# Patient Record
Sex: Male | Born: 1945 | Race: Black or African American | Hispanic: No | Marital: Single | Smoking: Former smoker
Health system: Southern US, Community
[De-identification: ages and names within clinical notes are randomized; demographics above are authoritative.]

## PROBLEM LIST (undated history)

## (undated) DIAGNOSIS — I219 Acute myocardial infarction, unspecified: Secondary | ICD-10-CM

## (undated) DIAGNOSIS — N189 Chronic kidney disease, unspecified: Secondary | ICD-10-CM

## (undated) DIAGNOSIS — Z87438 Personal history of other diseases of male genital organs: Secondary | ICD-10-CM

## (undated) DIAGNOSIS — K219 Gastro-esophageal reflux disease without esophagitis: Secondary | ICD-10-CM

## (undated) DIAGNOSIS — E785 Hyperlipidemia, unspecified: Secondary | ICD-10-CM

## (undated) DIAGNOSIS — C801 Malignant (primary) neoplasm, unspecified: Secondary | ICD-10-CM

## (undated) DIAGNOSIS — I10 Essential (primary) hypertension: Secondary | ICD-10-CM

## (undated) DIAGNOSIS — R1310 Dysphagia, unspecified: Secondary | ICD-10-CM

## (undated) DIAGNOSIS — I739 Peripheral vascular disease, unspecified: Secondary | ICD-10-CM

## (undated) HISTORY — PX: APPENDECTOMY: SHX54

## (undated) HISTORY — PX: STOMACH SURGERY: SHX791

---

## 2006-08-27 ENCOUNTER — Emergency Department: Payer: Self-pay | Admitting: Emergency Medicine

## 2006-08-27 ENCOUNTER — Other Ambulatory Visit: Payer: Self-pay

## 2007-04-17 ENCOUNTER — Other Ambulatory Visit: Payer: Self-pay

## 2007-04-17 ENCOUNTER — Emergency Department: Payer: Self-pay | Admitting: Emergency Medicine

## 2007-04-28 ENCOUNTER — Other Ambulatory Visit: Payer: Self-pay

## 2007-04-28 ENCOUNTER — Emergency Department: Payer: Self-pay | Admitting: Emergency Medicine

## 2007-05-16 ENCOUNTER — Ambulatory Visit: Payer: Self-pay | Admitting: Family Medicine

## 2007-06-15 ENCOUNTER — Emergency Department: Payer: Self-pay | Admitting: Emergency Medicine

## 2008-01-13 ENCOUNTER — Emergency Department: Payer: Self-pay | Admitting: Internal Medicine

## 2008-06-25 ENCOUNTER — Emergency Department: Payer: Self-pay | Admitting: Emergency Medicine

## 2008-09-27 ENCOUNTER — Ambulatory Visit: Payer: Self-pay | Admitting: Gastroenterology

## 2008-10-11 ENCOUNTER — Emergency Department: Payer: Self-pay | Admitting: Emergency Medicine

## 2008-10-12 ENCOUNTER — Emergency Department: Payer: Self-pay | Admitting: Emergency Medicine

## 2008-10-15 ENCOUNTER — Ambulatory Visit: Payer: Self-pay | Admitting: Internal Medicine

## 2010-12-13 ENCOUNTER — Observation Stay: Payer: Self-pay | Admitting: Specialist

## 2010-12-18 LAB — PATHOLOGY REPORT

## 2011-03-30 ENCOUNTER — Emergency Department: Payer: Self-pay | Admitting: Emergency Medicine

## 2012-01-09 ENCOUNTER — Emergency Department: Payer: Self-pay | Admitting: Emergency Medicine

## 2012-01-09 LAB — URINALYSIS, COMPLETE
Ketone: NEGATIVE
Leukocyte Esterase: NEGATIVE
Nitrite: NEGATIVE
Ph: 6 (ref 4.5–8.0)
Squamous Epithelial: 1

## 2012-01-09 LAB — BASIC METABOLIC PANEL
Anion Gap: 10 (ref 7–16)
BUN: 18 mg/dL (ref 7–18)
Chloride: 101 mmol/L (ref 98–107)
Creatinine: 1.13 mg/dL (ref 0.60–1.30)
EGFR (Non-African Amer.): >60
Glucose: 100 mg/dL — ABNORMAL HIGH (ref 65–99)
Osmolality: 280 (ref 275–301)
Sodium: 139 mmol/L (ref 136–145)

## 2012-01-09 LAB — CBC
HGB: 14.7 g/dL (ref 13.0–18.0)
MCH: 28.8 pg (ref 26.0–34.0)
MCHC: 33.7 g/dL (ref 32.0–36.0)
MCV: 86 fL (ref 80–100)
Platelet: 268 10*3/uL (ref 150–440)
RBC: 5.09 10*6/uL (ref 4.40–5.90)

## 2012-01-13 ENCOUNTER — Emergency Department: Payer: Self-pay | Admitting: Emergency Medicine

## 2012-01-13 LAB — URINALYSIS, COMPLETE
Epithelial Cast: 1
Glucose,UR: NEGATIVE mg/dL (ref 0–75)
Hyaline Cast: 13
Ketone: NEGATIVE
Leukocyte Esterase: NEGATIVE
Nitrite: NEGATIVE
Ph: 5 (ref 4.5–8.0)
Protein: 30
RBC,UR: 12 /HPF (ref 0–5)
Specific Gravity: 1.019 (ref 1.003–1.030)
Squamous Epithelial: <1
WBC UR: 8 /HPF (ref 0–5)
White Blood Cell Cast: 4

## 2013-05-31 ENCOUNTER — Emergency Department: Payer: Self-pay | Admitting: Emergency Medicine

## 2013-11-14 ENCOUNTER — Inpatient Hospital Stay: Payer: Self-pay | Admitting: Orthopedic Surgery

## 2013-11-14 ENCOUNTER — Ambulatory Visit: Payer: Self-pay | Admitting: Orthopedic Surgery

## 2013-11-14 LAB — BASIC METABOLIC PANEL
Anion Gap: 9 (ref 7–16)
BUN: 25 mg/dL — ABNORMAL HIGH (ref 7–18)
CREATININE: 1.24 mg/dL (ref 0.60–1.30)
Calcium, Total: 9 mg/dL (ref 8.5–10.1)
Chloride: 103 mmol/L (ref 98–107)
Co2: 28 mmol/L (ref 21–32)
EGFR (African American): >60
EGFR (Non-African Amer.): 59 — ABNORMAL LOW
Glucose: 110 mg/dL — ABNORMAL HIGH (ref 65–99)
OSMOLALITY: 284 (ref 275–301)
Potassium: 3.6 mmol/L (ref 3.5–5.1)
SODIUM: 140 mmol/L (ref 136–145)

## 2013-11-14 LAB — CBC
HCT: 43.8 % (ref 40.0–52.0)
HGB: 14 g/dL (ref 13.0–18.0)
MCH: 27.3 pg (ref 26.0–34.0)
MCHC: 31.9 g/dL — ABNORMAL LOW (ref 32.0–36.0)
MCV: 86 fL (ref 80–100)
Platelet: 252 10*3/uL (ref 150–440)
RBC: 5.11 10*6/uL (ref 4.40–5.90)
RDW: 15.4 % — ABNORMAL HIGH (ref 11.5–14.5)
WBC: 8.3 10*3/uL (ref 3.8–10.6)
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
## 2014-07-20 NOTE — Op Note (Signed)
PATIENT NAME:  Ruben Diaz, Ruben Diaz MR#:  [id number] DATE OF BIRTH:  [birth year]  DATE OF PROCEDURE:  11/15/2013  PREOPERATIVE DIAGNOSES: Right tibia and fibula fractures.   POSTOPERATIVE DIAGNOSES: Right tibia and fibula fractures.   PROCEDURE: Right tibial nailing.   ANESTHESIA: Spinal.   SURGEON: Dr. Rudene Christians.   DESCRIPTION OF PROCEDURE: The patient was brought to the operating room and after adequate anesthesia was obtained, the right leg was prepped and draped in the usual sterile fashion with a tourniquet applied to the upper thigh but not required during the procedure. After patient identification and timeout procedures were completed, the knee was in a flexed position and the C-arm was brought in. Visualization of the starting point for the tibia was determined, and a midline patellar tendon splitting incision was made. The starter hole was then again repeat identified and an awl used to get a start hole in the tibia. A guidewire was inserted down the canal and the fracture was held in a reduced position, as the guidewire was passed across the fracture site checking both AP and lateral images to make certain we were in the distal fragment. The wire was measured, and a length of 360 mm determined. Reaming was carried out to 10.5 mm, at which point there was good chatter in the shaft and a 360 x 9-mm Biomet VersaNail was impacted down the canal. The guidewire was removed, and when the nail was at the appropriate level, the proximal interlocking guide was placed and a dynamic screw placed proximally, drilling, measuring, and placing the bicortical screw. Next, going distally, a freehand technique was used to fix the distal fragment with 2 medial to lateral interlocking screws with standard technique, visualizing the holes, making a small incision, drilling, measuring, and placing bicortical screws. After this was completed, the rotation appeared to be appropriate. AP and lateral alignment was near anatomic.  The wounds were irrigated and closed with 0 Vicryl for the patellar tendon, 2-0 Vicryl subcutaneously, and skin staples. Xeroform, 4 x 4's, Webril, and Ace wrap applied and the patient was sent to the recovery room in stable condition.   ESTIMATED BLOOD LOSS: 50 mL.   COMPLICATIONS: None.   SPECIMEN: None.   IMPLANTS: Biomet/DePuy tibial VersaNail 9 x 360, with a zero end cap.   CONDITION: To recovery room, stable.   COMPLICATIONS: None.    ____________________________ Laurene Footman, MD mjm:lt D: 11/15/2013 17:49:15 ET T: 11/15/2013 18:41:11 ET JOB#: [id number]  cc: Laurene Footman, MD, <Dictator> Laurene Footman MD ELECTRONICALLY SIGNED 11/16/2013 7:15
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
## 2016-03-30 ENCOUNTER — Telehealth: Payer: Self-pay | Admitting: *Deleted

## 2016-03-30 DIAGNOSIS — Z87891 Personal history of nicotine dependence: Secondary | ICD-10-CM

## 2016-03-30 NOTE — Telephone Encounter (Signed)
Received referral for initial lung cancer screening scan. Contacted patient/spouse and obtained smoking history,(former, quit 5 years ago, 35.7 pack year ) as well as answering questions related to screening process. Patient denies signs of lung cancer such as weight loss or hemoptysis. Patient denies comorbidity that would prevent curative treatment if lung cancer were found. Patient is tentatively scheduled for shared decision making visit and CT scan on 04/06/16 at 1:30pm, pending insurance approval from business office.

## 2016-04-06 ENCOUNTER — Ambulatory Visit
Admission: RE | Admit: 2016-04-06 | Discharge: 2016-04-06 | Disposition: A | Discharge status: Home / Self Care | Payer: Medicare Other | Source: Ambulatory Visit | Attending: Oncology | Admitting: Oncology

## 2016-04-06 ENCOUNTER — Inpatient Hospital Stay: Payer: Medicare Other | Attending: Oncology | Admitting: Oncology

## 2016-04-06 ENCOUNTER — Encounter: Payer: Self-pay | Admitting: Oncology

## 2016-04-06 DIAGNOSIS — Z87891 Personal history of nicotine dependence: Secondary | ICD-10-CM

## 2016-04-06 DIAGNOSIS — I700 Atherosclerosis of aorta: Secondary | ICD-10-CM | POA: Diagnosis not present

## 2016-04-06 DIAGNOSIS — J439 Emphysema, unspecified: Secondary | ICD-10-CM | POA: Diagnosis not present

## 2016-04-06 DIAGNOSIS — Z122 Encounter for screening for malignant neoplasm of respiratory organs: Secondary | ICD-10-CM

## 2016-04-06 DIAGNOSIS — J9811 Atelectasis: Secondary | ICD-10-CM | POA: Diagnosis not present

## 2016-04-07 ENCOUNTER — Telehealth: Payer: Self-pay | Admitting: *Deleted

## 2016-04-07 DIAGNOSIS — Z87891 Personal history of nicotine dependence: Secondary | ICD-10-CM | POA: Insufficient documentation

## 2016-04-07 NOTE — Telephone Encounter (Signed)
Notified patient of LDCT lung cancer screening results with recommendation for 12 month follow up imaging. Also notified of incidental finding noted below and encouraged to discuss with PCP. Patient verbalizes understanding. This note will be sent to PCP via Epic.  IMPRESSION: 1. Lung-RADS Category 2, benign appearance or behavior. Continue annual screening with low-dose chest CT without contrast in 12 months. 2. Aortic atherosclerosis (ICD10-170.0). Coronary artery calcification. 3.  Emphysema (ICD10-J43.9).

## 2016-04-07 NOTE — Progress Notes (Signed)
In accordance with CMS guidelines, patient has met eligibility criteria including age, absence of signs or symptoms of lung cancer.  Social History  Substance Use Topics  . Smoking status: Former Smoker    Packs/day: 0.70    Years: 51.00    Quit date: 2013  . Smokeless tobacco: Not on file  . Alcohol use Not on file     A shared decision-making session was conducted prior to the performance of CT scan. This includes one or more decision aids, includes benefits and harms of screening, follow-up diagnostic testing, over-diagnosis, false positive rate, and total radiation exposure.  Counseling on the importance of adherence to annual lung cancer LDCT screening, impact of co-morbidities, and ability or willingness to undergo diagnosis and treatment is imperative for compliance of the program.  Counseling on the importance of continued smoking cessation for former smokers; the importance of smoking cessation for current smokers, and information about tobacco cessation interventions have been given to patient including [place] and [place] quit [place] programs.  Written order for lung cancer screening with LDCT has been given to the patient and any and all questions have been answered to the best of my abilities.   Yearly follow up will be coordinated by Burgess Estelle, Thoracic Navigator.

## 2016-05-28 ENCOUNTER — Other Ambulatory Visit: Payer: Self-pay | Admitting: Gastroenterology

## 2016-05-28 DIAGNOSIS — R1310 Dysphagia, unspecified: Secondary | ICD-10-CM

## 2016-06-04 ENCOUNTER — Ambulatory Visit
Admission: RE | Admit: 2016-06-04 | Discharge: 2016-06-04 | Disposition: A | Discharge status: Home / Self Care | Payer: Medicare Other | Source: Ambulatory Visit | Attending: Gastroenterology | Admitting: Gastroenterology

## 2016-06-04 DIAGNOSIS — K449 Diaphragmatic hernia without obstruction or gangrene: Secondary | ICD-10-CM | POA: Diagnosis not present

## 2016-06-04 DIAGNOSIS — R1310 Dysphagia, unspecified: Secondary | ICD-10-CM | POA: Insufficient documentation

## 2016-09-15 ENCOUNTER — Encounter: Payer: Self-pay | Admitting: *Deleted

## 2016-09-16 ENCOUNTER — Encounter: Payer: Self-pay | Admitting: *Deleted

## 2016-09-16 ENCOUNTER — Ambulatory Visit
Admission: RE | Admit: 2016-09-16 | Discharge: 2016-09-16 | Disposition: A | Discharge status: Home / Self Care | Payer: Medicare Other | Source: Ambulatory Visit | Attending: Gastroenterology | Admitting: Gastroenterology

## 2016-09-16 ENCOUNTER — Ambulatory Visit: Payer: Medicare Other | Admitting: Anesthesiology

## 2016-09-16 ENCOUNTER — Encounter
Admission: RE | Disposition: A | Discharge status: Home / Self Care | Payer: Self-pay | Source: Ambulatory Visit | Attending: Gastroenterology

## 2016-09-16 DIAGNOSIS — I739 Peripheral vascular disease, unspecified: Secondary | ICD-10-CM | POA: Insufficient documentation

## 2016-09-16 DIAGNOSIS — I10 Essential (primary) hypertension: Secondary | ICD-10-CM | POA: Insufficient documentation

## 2016-09-16 DIAGNOSIS — K319 Disease of stomach and duodenum, unspecified: Secondary | ICD-10-CM | POA: Diagnosis not present

## 2016-09-16 DIAGNOSIS — Z980 Intestinal bypass and anastomosis status: Secondary | ICD-10-CM | POA: Insufficient documentation

## 2016-09-16 DIAGNOSIS — K2210 Ulcer of esophagus without bleeding: Secondary | ICD-10-CM | POA: Insufficient documentation

## 2016-09-16 DIAGNOSIS — K2960 Other gastritis without bleeding: Secondary | ICD-10-CM | POA: Diagnosis not present

## 2016-09-16 DIAGNOSIS — D123 Benign neoplasm of transverse colon: Secondary | ICD-10-CM | POA: Insufficient documentation

## 2016-09-16 DIAGNOSIS — K224 Dyskinesia of esophagus: Secondary | ICD-10-CM | POA: Diagnosis not present

## 2016-09-16 DIAGNOSIS — Z86010 Personal history of colonic polyps: Secondary | ICD-10-CM | POA: Insufficient documentation

## 2016-09-16 DIAGNOSIS — D124 Benign neoplasm of descending colon: Secondary | ICD-10-CM | POA: Diagnosis not present

## 2016-09-16 DIAGNOSIS — Z87891 Personal history of nicotine dependence: Secondary | ICD-10-CM | POA: Diagnosis not present

## 2016-09-16 DIAGNOSIS — R1310 Dysphagia, unspecified: Secondary | ICD-10-CM | POA: Insufficient documentation

## 2016-09-16 HISTORY — DX: Peripheral vascular disease, unspecified: I73.9

## 2016-09-16 HISTORY — PX: ESOPHAGOGASTRODUODENOSCOPY (EGD) WITH PROPOFOL: SHX5813

## 2016-09-16 HISTORY — DX: Chronic kidney disease, unspecified: N18.9

## 2016-09-16 HISTORY — DX: Essential (primary) hypertension: I10

## 2016-09-16 HISTORY — DX: Personal history of other diseases of male genital organs: Z87.438

## 2016-09-16 HISTORY — PX: COLONOSCOPY WITH PROPOFOL: SHX5780

## 2016-09-16 SURGERY — COLONOSCOPY WITH PROPOFOL
Anesthesia: General

## 2016-09-16 MED ORDER — FENTANYL CITRATE (PF) 100 MCG/2ML IJ SOLN
INTRAMUSCULAR | Status: DC | PRN
Start: 2016-09-16 — End: 2016-09-16
  Administered 2016-09-16 (×2): 50 ug via INTRAVENOUS

## 2016-09-16 MED ORDER — SODIUM CHLORIDE 0.9 % IV SOLN: INTRAVENOUS | Status: DC

## 2016-09-16 MED ORDER — PROPOFOL 500 MG/50ML IV EMUL
INTRAVENOUS | Status: AC
  Filled 2016-09-16: qty 50

## 2016-09-16 MED ORDER — SODIUM CHLORIDE 0.9 % IV SOLN
INTRAVENOUS | Status: DC
  Administered 2016-09-16 (×3): via INTRAVENOUS

## 2016-09-16 MED ORDER — PROPOFOL 500 MG/50ML IV EMUL
INTRAVENOUS | Status: DC | PRN
  Administered 2016-09-16: 160 ug/kg/min via INTRAVENOUS

## 2016-09-16 MED ORDER — FENTANYL CITRATE (PF) 100 MCG/2ML IJ SOLN
INTRAMUSCULAR | Status: AC
  Filled 2016-09-16: qty 2

## 2016-09-16 MED ORDER — MIDAZOLAM HCL 5 MG/5ML IJ SOLN
INTRAMUSCULAR | Status: DC | PRN
  Administered 2016-09-16 (×2): 1 mg via INTRAVENOUS

## 2016-09-16 MED ORDER — MIDAZOLAM HCL 2 MG/2ML IJ SOLN
INTRAMUSCULAR | Status: AC
  Filled 2016-09-16: qty 2

## 2016-09-16 MED ORDER — LIDOCAINE 2% (20 MG/ML) 5 ML SYRINGE
INTRAMUSCULAR | Status: DC | PRN
  Administered 2016-09-16: 40 mg via INTRAVENOUS

## 2016-09-16 MED ORDER — PROPOFOL 10 MG/ML IV BOLUS
INTRAVENOUS | Status: DC | PRN
  Administered 2016-09-16: 100 mg via INTRAVENOUS

## 2016-09-16 MED ORDER — PHENYLEPHRINE HCL 10 MG/ML IJ SOLN
INTRAMUSCULAR | Status: DC | PRN
  Administered 2016-09-16: 100 ug via INTRAVENOUS

## 2016-09-16 NOTE — Anesthesia Preprocedure Evaluation (Addendum)
Anesthesia Evaluation  Patient identified by MRN, date of birth, ID band Patient awake    Reviewed: Allergy & Precautions, NPO status , Patient's Chart, lab work & pertinent test results  Airway Mallampati: II  TM Distance: >3 FB     Dental  (+) Upper Dentures, Lower Dentures   Pulmonary former smoker,    Pulmonary exam normal        Cardiovascular hypertension, Pt. on medications + Peripheral Vascular Disease  Normal cardiovascular exam     Neuro/Psych negative neurological ROS  negative psych ROS   GI/Hepatic negative GI ROS, Neg liver ROS,   Endo/Other  negative endocrine ROS  Renal/GU Renal InsufficiencyRenal disease  negative genitourinary   Musculoskeletal negative musculoskeletal ROS (+)   Abdominal Normal abdominal exam  (+)   Peds negative pediatric ROS (+)  Hematology negative hematology ROS (+)   Anesthesia Other Findings   Reproductive/Obstetrics                            Anesthesia Physical Anesthesia Plan  ASA: III  Anesthesia Plan: General   Post-op Pain Management:    Induction: Intravenous  PONV Risk Score and Plan:   Airway Management Planned: Nasal Cannula  Additional Equipment:   Intra-op Plan:   Post-operative Plan:   Informed Consent: I have reviewed the patients History and Physical, chart, labs and discussed the procedure including the risks, benefits and alternatives for the proposed anesthesia with the patient or authorized representative who has indicated his/her understanding and acceptance.   Dental advisory given  Plan Discussed with: CRNA and Surgeon  Anesthesia Plan Comments:         Anesthesia Quick Evaluation

## 2016-09-16 NOTE — Transfer of Care (Signed)
Immediate Anesthesia Transfer of Care Note  Patient: Ruben Diaz  Procedure(s) Performed: Procedure(s): COLONOSCOPY WITH PROPOFOL (N/A) ESOPHAGOGASTRODUODENOSCOPY (EGD) WITH PROPOFOL (N/A)  Patient Location: PACU and Endoscopy Unit  Anesthesia Type:General  Level of Consciousness: sedated  Airway & Oxygen Therapy: Patient Spontanous Breathing and Patient connected to nasal cannula oxygen  Post-op Assessment: Report given to RN and Post -op Vital signs reviewed and stable  Post vital signs: Reviewed and stable  Last Vitals:  Vitals:   09/16/16 0853  BP: 131/62  Pulse: 62  Resp: 18  Temp: (!) 36 C    Last Pain:  Vitals:   09/16/16 0853  TempSrc: Tympanic         Complications: No apparent anesthesia complications

## 2016-09-16 NOTE — Op Note (Signed)
[facility] Gastroenterology Patient Name: Ruben Diaz Procedure Date: 09/16/2016 10:33 AM MRN: [id number] Account #: [id number] Date of Birth: [birth year] Admit Type: Outpatient Age: 70 Room: [facility] ENDO ROOM 1 Gender: Male Note Status: Finalized Procedure:            Colonoscopy Indications:          Personal history of colonic polyps Providers:            [name], MD Referring MD:         Adrian Prows (Referring MD) Medicines:            Monitored Anesthesia Care Complications:        No immediate complications. Procedure:            Pre-Anesthesia Assessment:                       - ASA Grade Assessment: III - A patient with severe                        systemic disease.                       After obtaining informed consent, the colonoscope was                        passed under direct vision. Throughout the procedure,                        the patient's blood pressure, pulse, and oxygen                        saturations were monitored continuously. The                        Colonoscope was introduced through the anus and                        advanced to the the cecum, identified by appendiceal                        orifice and ileocecal valve. The colonoscopy was                        extremely difficult due to restricted mobility of the                        colon and a tortuous colon. Successful completion of                        the procedure was aided by changing the patient to a                        supine position, changing the patient to a prone                        position and using manual pressure. The quality of the                        bowel preparation was fair. The patient tolerated the  procedure well. Findings:      A 3 mm polyp was found in the descending colon. The polyp was sessile.       The polyp was removed with a cold biopsy forceps. Resection and       retrieval were  complete.      A 5 mm polyp was found in the splenic flexure. The polyp was sessile.       The polyp was removed with a cold snare. Resection and retrieval were       complete.      A 2 mm polyp was found in the splenic flexure. The polyp was sessile.       The polyp was removed with a cold biopsy forceps. Resection and       retrieval were complete.      A 4 mm polyp was found in the transverse colon. The polyp was sessile.       The polyp was removed with a cold snare. Resection and retrieval were       complete.      A 4 mm polyp was found in the hepatic flexure. The polyp was sessile.       The polyp was removed with a cold biopsy forceps. Polyp resection was       incomplete. The resected tissue was retrieved. Scope malfunction at this       point caused removal of instrument. Another scope was reintroduced but       despite multiple passes this could not be relocated.      A 4 mm polyp was found in the distal transverse colon. The polyp was       sessile. The polyp was removed with a cold snare. Resection and       retrieval were complete.      A 5 mm polyp was found in the hepatic flexure. The polyp was flat. The       polyp was removed with a cold snare. Resection and retrieval were       complete.      It is of noted that the right colon is very difficult to access and       evaluate due to apparent tethering. Impression:           - Preparation of the colon was fair.                       - One 3 mm polyp in the descending colon, removed with                        a cold biopsy forceps. Resected and retrieved.                       - One 5 mm polyp at the splenic flexure, removed with a                        cold snare. Resected and retrieved.                       - One 2 mm polyp at the splenic flexure, removed with a                        cold biopsy forceps. Resected and retrieved.                       -  One 4 mm polyp in the transverse colon, removed with                         a cold snare. Resected and retrieved.                       - One 4 mm polyp at the hepatic flexure, removed with a                        cold biopsy forceps. Incomplete resection. Resected                        tissue retrieved.                       - One 4 mm polyp in the distal transverse colon,                        removed with a cold snare. Resected and retrieved.                       - One 5 mm polyp at the hepatic flexure, removed with a                        cold snare. Resected and retrieved. Recommendation:       - Full liquid diet today, then advance as tolerated to                        low residue diet for 3 days. Procedure Code(s):    --- Professional ---                       [phone], Colonoscopy, flexible; with removal of tumor(s),                        polyp(s), or other lesion(s) by snare technique                       [id number], 74, Colonoscopy, flexible; with biopsy, single                        or multiple Diagnosis Code(s):    --- Professional ---                       D12.4, Benign neoplasm of descending colon                       D12.3, Benign neoplasm of transverse colon (hepatic                        flexure or splenic flexure)                       Z86.010, Personal history of colonic polyps CPT copyright 2016 American Medical Association. All rights reserved. The codes documented in this report are preliminary and upon coder review may  be revised to meet current compliance requirements. [name], MD 09/16/2016 12:26:41 PM This report has been signed electronically. Number of Addenda: 0 Note Initiated On: 09/16/2016 10:33 AM Scope Withdrawal Time: 0 hours 41 minutes  16 seconds  Total Procedure Duration: 1 hour 8 minutes 12 seconds       [facility]

## 2016-09-16 NOTE — Anesthesia Postprocedure Evaluation (Signed)
Anesthesia Post Note  Patient: Davan Nawabi  Procedure(s) Performed: Procedure(s) (LRB): COLONOSCOPY WITH PROPOFOL (N/A) ESOPHAGOGASTRODUODENOSCOPY (EGD) WITH PROPOFOL (N/A)  Patient location during evaluation: Endoscopy Anesthesia Type: General Level of consciousness: awake and alert and oriented Pain management: pain level controlled Vital Signs Assessment: post-procedure vital signs reviewed and stable Respiratory status: spontaneous breathing, nonlabored ventilation and respiratory function stable Cardiovascular status: blood pressure returned to baseline and stable Postop Assessment: no signs of nausea or vomiting Anesthetic complications: no     Last Vitals:  Vitals:   09/16/16 1225 09/16/16 1234  BP: 109/69 127/77  Pulse: 68 60  Resp: 16 10  Temp: 36.2 C     Last Pain:  Vitals:   09/16/16 1225  TempSrc: Tympanic                 Lamoyne Hessel

## 2016-09-16 NOTE — Op Note (Signed)
[facility] Gastroenterology Patient Name: Ruben Diaz Procedure Date: 09/16/2016 10:34 AM MRN: [id number] Account #: [id number] Date of Birth: [birth year] Admit Type: Outpatient Age: 71 Room: [facility] ENDO ROOM 1 Gender: Male Note Status: Finalized Procedure:            Upper GI endoscopy Indications:          Dysphagia Providers:            [name], MD Referring MD:         Adrian Prows (Referring MD) Medicines:            Monitored Anesthesia Care Procedure:            Pre-Anesthesia Assessment:                       - ASA Grade Assessment: III - A patient with severe                        systemic disease.                       After obtaining informed consent, the endoscope was                        passed under direct vision. Throughout the procedure,                        the patient's blood pressure, pulse, and oxygen                        saturations were monitored continuously. The Endoscope                        was introduced through the mouth, and advanced to the                        gastric stoma. The upper GI endoscopy was accomplished                        without difficulty. The patient tolerated the procedure                        well. Findings:      Abnormal motility was noted in the mid esophagus and in the distal       esophagus. The cricopharyngeus was normal. There are extra peristaltic       waves in the esophageal body. Tertiary peristaltic waves are noted.      LA Grade D (one or more mucosal breaks involving at least 75% of       esophageal circumference) esophagitis with no bleeding was found.       Biopsies were taken with a cold forceps for histology.      Diffuse, moreso notedin the distal half of the stomach, severe       inflammation characterized by adherent blood, congestion (edema) and       erythema was found in the entire examined stomach. Biopsies were taken       with a cold forceps for  histology.      Evidence of a gastroenterostomy was found in the lesser curvature of the       stomach. This was characterized by mild edema  and minimal erosion just       inside the orifice. The angulation of the opening was very sharp, and I       was unable to pass the scope through to the small bowel.      The cardia and gastric fundus were normal on retroflexion otherwise. Impression:           - Abnormal esophageal motility, consistent with                        presbyesophagus.                       - LA Grade D erosive esophagitis. Biopsied.                       - Erosive gastritis. Biopsied.                       - A gastroenterostomy was found, characterized by edema                        and erosion. Recommendation:       - Use Protonix (pantoprazole) 40 mg PO BID for 6 weeks.                       - Use Protonix (pantoprazole) 40 mg PO daily daily.                       - Use sucralfate tablets 1 gram PO QID for 1 month.                       - Return to GI clinic in 5 weeks.                       - Repeat upper endoscopy in 8 weeks to evaluate the                        response to therapy. Procedure Code(s):    --- Professional ---                       [phone], 58, Esophagogastroduodenoscopy, flexible,                        transoral; with biopsy, single or multiple Diagnosis Code(s):    --- Professional ---                       K22.4, Dyskinesia of esophagus                       K20.8, Other esophagitis                       K29.60, Other gastritis without bleeding                       Z98.0, Intestinal bypass and anastomosis status                       R13.10, Dysphagia, unspecified CPT copyright 2016 American [employer] Association. All rights reserved. The codes documented in this report are preliminary and upon coder review may  be revised to meet current compliance requirements. [name], MD 09/16/2016 11:06:37 AM This report has been signed  electronically. Number of Addenda: 0 Note Initiated On: 09/16/2016 10:34 AM      [facility]

## 2016-09-16 NOTE — H&P (Signed)
Outpatient short stay form Pre-procedure 09/16/2016 10:31 AM [name] MD  Primary Physician: Dr. Adrian Prows  Reason for visit:  EGD and colonoscopy  History of present illness:  Patient is a 71 year old male presenting today as above. He has a personal history of colon polyps and has been overdue for recheck on his colonoscopy. He also has some issues with dysphagia. He had a barium swallow done on 06/04/2016. That time showed a small hiatal hernia negative for reflux a barium tablet passed without delay. It is of note that when I reviewed this he had evidence of presbyesophagus in the distal half of the esophagus with quite irregular contractions. It was no obvious stenosis or stricture. He does take 81 mg aspirin the last time this morning. He takes no other aspirin or blood thinning agents. He tolerated his prep well.    Current Facility-Administered Medications:  .  0.9 %  sodium chloride infusion, , Intravenous, Continuous, [name], MD, Last Rate: 20 mL/hr at 09/16/16 0906 .  0.9 %  sodium chloride infusion, , Intravenous, Continuous, [name], MD  Prescriptions Prior to Admission  Medication Sig Dispense Refill Last Dose  . amLODipine (NORVASC) 10 MG tablet Take 10 mg by mouth daily.   09/16/2016 at 0600  . aspirin EC 81 MG tablet Take 81 mg by mouth daily.   09/16/2016 at 0600  . atorvastatin (LIPITOR) 10 MG tablet Take 10 mg by mouth daily.   09/16/2016 at 0600  . hydrochlorothiazide (HYDRODIURIL) 25 MG tablet Take 25 mg by mouth daily.   09/16/2016 at 0600     No Known Allergies   Past Medical History:  Diagnosis Date  . Chronic kidney disease   . History of BPH   . Hypertension   . Peripheral vascular disease ([place])     Review of systems:      Physical Exam    Heart and lungs: Regular rate and rhythm without rub or gallop, lungs are bilaterally clear.    HEENT: Normocephalic atraumatic eyes are anicteric    Other:     Pertinant  exam for procedure: Soft nontender nondistended bowel sounds positive normoactive    Planned proceedures: EGD, colonoscopy and indicated procedures. I have discussed the risks benefits and complications of procedures to include not limited to bleeding, infection, perforation and the risk of sedation and the patient wishes to proceed.    [name], MD Gastroenterology 09/16/2016  10:31 AM

## 2016-09-16 NOTE — Anesthesia Post-op Follow-up Note (Cosign Needed)
Anesthesia QCDR form completed.        

## 2016-09-17 LAB — SURGICAL PATHOLOGY

## 2016-09-20 ENCOUNTER — Encounter: Payer: Self-pay | Admitting: Gastroenterology

## 2016-12-15 ENCOUNTER — Encounter: Payer: Self-pay | Admitting: *Deleted

## 2016-12-16 ENCOUNTER — Ambulatory Visit: Payer: Medicare Other | Admitting: Anesthesiology

## 2016-12-16 ENCOUNTER — Ambulatory Visit
Admission: RE | Admit: 2016-12-16 | Discharge: 2016-12-16 | Disposition: A | Discharge status: Home / Self Care | Payer: Medicare Other | Source: Ambulatory Visit | Attending: Gastroenterology | Admitting: Gastroenterology

## 2016-12-16 ENCOUNTER — Encounter
Admission: RE | Disposition: A | Discharge status: Home / Self Care | Payer: Self-pay | Source: Ambulatory Visit | Attending: Gastroenterology

## 2016-12-16 ENCOUNTER — Encounter: Payer: Self-pay | Admitting: Anesthesiology

## 2016-12-16 DIAGNOSIS — E785 Hyperlipidemia, unspecified: Secondary | ICD-10-CM | POA: Diagnosis not present

## 2016-12-16 DIAGNOSIS — K2970 Gastritis, unspecified, without bleeding: Secondary | ICD-10-CM | POA: Insufficient documentation

## 2016-12-16 DIAGNOSIS — Z7982 Long term (current) use of aspirin: Secondary | ICD-10-CM | POA: Insufficient documentation

## 2016-12-16 DIAGNOSIS — N189 Chronic kidney disease, unspecified: Secondary | ICD-10-CM | POA: Insufficient documentation

## 2016-12-16 DIAGNOSIS — Z87891 Personal history of nicotine dependence: Secondary | ICD-10-CM | POA: Diagnosis not present

## 2016-12-16 DIAGNOSIS — I739 Peripheral vascular disease, unspecified: Secondary | ICD-10-CM | POA: Diagnosis not present

## 2016-12-16 DIAGNOSIS — I252 Old myocardial infarction: Secondary | ICD-10-CM | POA: Diagnosis not present

## 2016-12-16 DIAGNOSIS — K222 Esophageal obstruction: Secondary | ICD-10-CM | POA: Insufficient documentation

## 2016-12-16 DIAGNOSIS — K224 Dyskinesia of esophagus: Secondary | ICD-10-CM | POA: Diagnosis not present

## 2016-12-16 DIAGNOSIS — Z79899 Other long term (current) drug therapy: Secondary | ICD-10-CM | POA: Diagnosis not present

## 2016-12-16 DIAGNOSIS — N400 Enlarged prostate without lower urinary tract symptoms: Secondary | ICD-10-CM | POA: Insufficient documentation

## 2016-12-16 DIAGNOSIS — K219 Gastro-esophageal reflux disease without esophagitis: Secondary | ICD-10-CM | POA: Diagnosis not present

## 2016-12-16 DIAGNOSIS — Z903 Acquired absence of stomach [part of]: Secondary | ICD-10-CM | POA: Insufficient documentation

## 2016-12-16 DIAGNOSIS — I129 Hypertensive chronic kidney disease with stage 1 through stage 4 chronic kidney disease, or unspecified chronic kidney disease: Secondary | ICD-10-CM | POA: Diagnosis not present

## 2016-12-16 DIAGNOSIS — Z980 Intestinal bypass and anastomosis status: Secondary | ICD-10-CM | POA: Diagnosis not present

## 2016-12-16 DIAGNOSIS — Z09 Encounter for follow-up examination after completed treatment for conditions other than malignant neoplasm: Secondary | ICD-10-CM | POA: Diagnosis present

## 2016-12-16 HISTORY — PX: ESOPHAGOGASTRODUODENOSCOPY (EGD) WITH PROPOFOL: SHX5813

## 2016-12-16 HISTORY — DX: Acute myocardial infarction, unspecified: I21.9

## 2016-12-16 HISTORY — DX: Hyperlipidemia, unspecified: E78.5

## 2016-12-16 HISTORY — DX: Gastro-esophageal reflux disease without esophagitis: K21.9

## 2016-12-16 HISTORY — DX: Dysphagia, unspecified: R13.10

## 2016-12-16 SURGERY — ESOPHAGOGASTRODUODENOSCOPY (EGD) WITH PROPOFOL
Anesthesia: General

## 2016-12-16 MED ORDER — SODIUM CHLORIDE 0.9 % IV SOLN: INTRAVENOUS | Status: DC

## 2016-12-16 MED ORDER — PHENYLEPHRINE HCL 10 MG/ML IJ SOLN
INTRAMUSCULAR | Status: DC | PRN
  Administered 2016-12-16: 100 ug via INTRAVENOUS

## 2016-12-16 MED ORDER — SODIUM CHLORIDE 0.9 % IV SOLN
INTRAVENOUS | Status: DC
  Administered 2016-12-16: 11:00:00 via INTRAVENOUS

## 2016-12-16 MED ORDER — PROPOFOL 10 MG/ML IV BOLUS
INTRAVENOUS | Status: DC | PRN
  Administered 2016-12-16 (×2): 50 mg via INTRAVENOUS

## 2016-12-16 MED ORDER — PROPOFOL 500 MG/50ML IV EMUL
INTRAVENOUS | Status: DC | PRN
  Administered 2016-12-16: 120 ug/kg/min via INTRAVENOUS

## 2016-12-16 MED ORDER — PROPOFOL 500 MG/50ML IV EMUL
INTRAVENOUS | Status: AC
  Filled 2016-12-16: qty 50

## 2016-12-16 NOTE — Anesthesia Preprocedure Evaluation (Addendum)
Anesthesia Evaluation  Patient identified by MRN, date of birth, ID band Patient awake    Reviewed: Allergy & Precautions, NPO status , Patient's Chart, lab work & pertinent test results, reviewed documented beta blocker date and time   Airway Mallampati: II  TM Distance: >3 FB     Dental  (+) Chipped, Upper Dentures, Lower Dentures   Pulmonary former smoker,           Cardiovascular hypertension, + Peripheral Vascular Disease       Neuro/Psych    GI/Hepatic   Endo/Other    Renal/GU Renal disease     Musculoskeletal   Abdominal   Peds  Hematology   Anesthesia Other Findings   Reproductive/Obstetrics                            Anesthesia Physical Anesthesia Plan  ASA: III  Anesthesia Plan: General   Post-op Pain Management:    Induction: Intravenous  PONV Risk Score and Plan:   Airway Management Planned:   Additional Equipment:   Intra-op Plan:   Post-operative Plan:   Informed Consent: I have reviewed the patients History and Physical, chart, labs and discussed the procedure including the risks, benefits and alternatives for the proposed anesthesia with the patient or authorized representative who has indicated his/her understanding and acceptance.     Plan Discussed with: CRNA  Anesthesia Plan Comments:         Anesthesia Quick Evaluation

## 2016-12-16 NOTE — H&P (Signed)
Outpatient short stay form Pre-procedure 12/16/2016 12:22 PM [name] MD  Primary Physician: Dr. Adrian Prows  Reason for visit:  EGD  History of present illness:  Patient is a 71 year old male presenting today as above. He had an EGD on 09/16/2016 showing a severe grade D erosive esophagitis as well as erosive gastritis. 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 He does take 81 mg aspirin but has held that. He is presenting today for follow-up in regards these issues above. He takes no other aspirin products or blood thinning agents. He denies use of NSAIDs.    Current Facility-Administered Medications:  .  0.9 %  sodium chloride infusion, , Intravenous, Continuous, [name], MD, Last Rate: 20 mL/hr at 12/16/16 1107 .  0.9 %  sodium chloride infusion, , Intravenous, Continuous, [name], MD  Prescriptions Prior to Admission  Medication Sig Dispense Refill Last Dose  . amLODipine (NORVASC) 10 MG tablet Take 10 mg by mouth daily.   12/16/2016 at Unknown time  . aspirin EC 81 MG tablet Take 81 mg by mouth daily.   12/15/2016 at Unknown time  . atorvastatin (LIPITOR) 10 MG tablet Take 10 mg by mouth daily.   12/15/2016 at Unknown time  . hydrochlorothiazide (HYDRODIURIL) 25 MG tablet Take 25 mg by mouth daily.   12/16/2016 at Unknown time  . pantoprazole (PROTONIX) 20 MG tablet Take 20 mg by mouth daily.   12/15/2016 at Unknown time     Not on File   Past Medical History:  Diagnosis Date  . Chronic kidney disease   . Dysphagia   . GERD (gastroesophageal reflux disease)   . History of BPH   . Hyperlipidemia   . Hypertension   . Myocardial infarction ([place])   . Peripheral vascular disease ([place])     Review of systems:      Physical Exam    Heart and lungs: Regular rate and rhythm without rub or gallop, lungs are bilaterally clear.    HEENT: Normocephalic atraumatic eyes are anicteric    Other:    Pertinant exam for procedure: Soft nontender nondistended bowel sounds positive normoactive.    Planned proceedures: EGD and indicated procedures. I have discussed the risks benefits and complications of procedures to include not limited to bleeding, infection, perforation and the risk of sedation and the patient wishes to proceed.    [name], MD Gastroenterology 12/16/2016  12:22 PM

## 2016-12-16 NOTE — Anesthesia Post-op Follow-up Note (Signed)
Anesthesia QCDR form completed.        

## 2016-12-16 NOTE — Transfer of Care (Signed)
Immediate Anesthesia Transfer of Care Note  Patient: Ruben Diaz  Procedure(s) Performed: Procedure(s): ESOPHAGOGASTRODUODENOSCOPY (EGD) WITH PROPOFOL (N/A)  Patient Location: Endoscopy Unit  Anesthesia Type:General  Level of Consciousness: awake and alert   Airway & Oxygen Therapy: Patient Spontanous Breathing and Patient connected to nasal cannula oxygen  Post-op Assessment: Report given to RN and Post -op Vital signs reviewed and stable  Post vital signs: Reviewed  Last Vitals:  Vitals:   12/16/16 1059 12/16/16 1239  BP: 113/63 (!) 99/47  Pulse: 71 69  Resp: 20 20  Temp: (!) 36.1 C (!) 36.3 C  SpO2: 98% 100%    Last Pain:  Vitals:   12/16/16 1059  TempSrc: Oral         Complications: No apparent anesthesia complications

## 2016-12-16 NOTE — Op Note (Signed)
[facility] Gastroenterology Patient Name: Ruben Diaz Procedure Date: 12/16/2016 12:15 PM MRN: [id number] Account #: [id number] Date of Birth: 1945/04/10 Admit Type: Outpatient Age: 71 Room: [facility] ENDO ROOM 1 Gender: Male Note Status: Finalized Procedure:            Upper GI endoscopy Indications:          Follow-up of reflux esophagitis Providers:            [name], MD Referring MD:         Adrian Prows (Referring MD) Medicines:            Monitored Anesthesia Care Complications:        No immediate complications. Procedure:            Pre-Anesthesia Assessment:                       - ASA Grade Assessment: III - A patient with severe                        systemic disease.                       After obtaining informed consent, the endoscope was                        passed under direct vision. Throughout the procedure,                        the patient's blood pressure, pulse, and oxygen                        saturations were monitored continuously. The Endoscope                        was introduced through the mouth, and advanced to the                        duodenum. The upper GI endoscopy was performed with                        moderate difficulty due to presence of food. The                        patient tolerated the procedure well. Findings:      Abnormal motility was noted in the middle third of the esophagus and in       the lower third of the esophagus. The cricopharyngeus was normal. There       is spasticity of the esophageal body. Tertiary peristaltic waves are       noted.      A widely patent and non-obstructing/intermittant appearing Schatzki ring       (acquired) was found at the gastroesophageal junction.      The previously noted severe erosive esophagitis was much improved, no       esophagitis noted.      A medium amount of food (residue) was found in the gastric body,       impairing full visulaization of  the gastric vault.      Diffuse and patchy moderate inflammation characterized by adherent       blood, congestion (edema) and erythema was found in  the entire examined       stomach.      The opening of the gastro/duodenal anastomosis was easily intubated with       normal appearing small intestine noted.      The cardia and gastric fundus were normal on retroflexion otherwise. Impression:           - Abnormal esophageal motility, consistent with                        esophageal spasm.                       - Widely patent and non-obstructing Schatzki ring.                       - A medium amount of food (residue) in the stomach.                       - Erosive gastritis.                       - No specimens collected. Recommendation:       - Discharge patient to home.                       - Use Prilosec (omeprazole) 40 mg PO daily daily.                       - Use sucralfate tablets 1 gram PO BID daily.                       - Return to GI clinic in 6 weeks.                       - Low residue diet for the rest of the patient's life. Procedure Code(s):    --- Professional ---                       [phone], Esophagogastroduodenoscopy, flexible, transoral;                        diagnostic, including collection of specimen(s) by                        brushing or washing, when performed (separate procedure) Diagnosis Code(s):    --- Professional ---                       K22.4, Dyskinesia of esophagus                       K22.2, Esophageal obstruction                       K29.60, Other gastritis without bleeding                       K21.0, Gastro-esophageal reflux disease with esophagitis CPT copyright 2016 American Medical Association. All rights reserved. The codes documented in this report are preliminary and upon coder review may  be revised to meet current compliance requirements. [name], MD 12/16/2016 12:51:26 PM This report has been signed electronically. Number  of Addenda: 0 Note Initiated On:  12/16/2016 12:15 PM      [facility]

## 2016-12-17 ENCOUNTER — Encounter: Payer: Self-pay | Admitting: Gastroenterology

## 2016-12-17 NOTE — Anesthesia Postprocedure Evaluation (Signed)
Anesthesia Post Note  Patient: Ruben Diaz  Procedure(s) Performed: Procedure(s) (LRB): ESOPHAGOGASTRODUODENOSCOPY (EGD) WITH PROPOFOL (N/A)  Patient location during evaluation: Endoscopy Anesthesia Type: General Level of consciousness: awake and alert Pain management: pain level controlled Vital Signs Assessment: post-procedure vital signs reviewed and stable Respiratory status: spontaneous breathing, nonlabored ventilation, respiratory function stable and patient connected to nasal cannula oxygen Cardiovascular status: blood pressure returned to baseline and stable Postop Assessment: no apparent nausea or vomiting Anesthetic complications: no     Last Vitals:  Vitals:   12/16/16 1318 12/16/16 1328  BP: (!) 129/44   Pulse: (!) 59 (!) 54  Resp: 16 17  Temp:    SpO2: 98% 99%    Last Pain:  Vitals:   12/16/16 1059  TempSrc: Oral                 Quin[place] Boy S

## 2017-03-31 ENCOUNTER — Telehealth: Payer: Self-pay | Admitting: *Deleted

## 2017-03-31 DIAGNOSIS — Z87891 Personal history of nicotine dependence: Secondary | ICD-10-CM

## 2017-03-31 DIAGNOSIS — Z122 Encounter for screening for malignant neoplasm of respiratory organs: Secondary | ICD-10-CM

## 2017-03-31 NOTE — Telephone Encounter (Signed)
Left message for patient to notify them that it is time to schedule annual low dose lung cancer screening CT scan. Instructed patient to call back to verify information prior to the scan being scheduled.  

## 2017-03-31 NOTE — Telephone Encounter (Signed)
Notified patient that annual lung cancer screening low dose CT scan is due currently or will be in near future. Confirmed that patient is within the age range of 55-77, and asymptomatic, (no signs or symptoms of lung cancer). Patient denies illness that would prevent curative treatment for lung cancer if found. Verified smoking history, (former, quit 2013, 35.7 pack year). The shared decision making visit was done 04/06/16. Patient is agreeable for CT scan being scheduled.

## 2017-04-07 ENCOUNTER — Encounter: Payer: Self-pay | Admitting: *Deleted

## 2017-04-18 ENCOUNTER — Ambulatory Visit
Admission: RE | Admit: 2017-04-18 | Discharge: 2017-04-18 | Disposition: A | Discharge status: Home / Self Care | Payer: Medicare Other | Source: Ambulatory Visit | Attending: Nurse Practitioner | Admitting: Nurse Practitioner

## 2017-04-18 DIAGNOSIS — I2510 Atherosclerotic heart disease of native coronary artery without angina pectoris: Secondary | ICD-10-CM | POA: Diagnosis not present

## 2017-04-18 DIAGNOSIS — I700 Atherosclerosis of aorta: Secondary | ICD-10-CM | POA: Insufficient documentation

## 2017-04-18 DIAGNOSIS — Z122 Encounter for screening for malignant neoplasm of respiratory organs: Secondary | ICD-10-CM | POA: Diagnosis present

## 2017-04-18 DIAGNOSIS — J432 Centrilobular emphysema: Secondary | ICD-10-CM | POA: Insufficient documentation

## 2017-04-18 DIAGNOSIS — Z87891 Personal history of nicotine dependence: Secondary | ICD-10-CM | POA: Diagnosis present

## 2017-04-25 ENCOUNTER — Telehealth: Payer: Self-pay | Admitting: *Deleted

## 2017-04-25 ENCOUNTER — Other Ambulatory Visit: Payer: Self-pay | Admitting: *Deleted

## 2017-04-25 DIAGNOSIS — R918 Other nonspecific abnormal finding of lung field: Secondary | ICD-10-CM

## 2017-04-25 NOTE — Telephone Encounter (Signed)
After notifying patient's PCP, notified patient of suspicious lesion on CT scan of chest requiring further evaluation. Patient is aware that he is to expect notification of appointments this week potentially for a PET scan, PFT's, and lung clinic consultation.

## 2017-04-26 ENCOUNTER — Other Ambulatory Visit: Payer: Self-pay | Admitting: Oncology

## 2017-04-26 ENCOUNTER — Encounter: Payer: Self-pay | Admitting: *Deleted

## 2017-04-26 ENCOUNTER — Telehealth: Payer: Self-pay | Admitting: Oncology

## 2017-04-26 NOTE — Telephone Encounter (Signed)
Patient is a 72 year old male who had a noncontrast CT after referral to the lung cancer screening program.  He had a history of pulmonary nodules that were being closely observed with periodic imaging.  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  Patient has a heavy history of tobacco use with a greater than 35-pack-year history.  He was asymptomatic at the time of imaging.  Patient has been referred to the [facility] and will require a PET scan for further evaluation and to optimize management.  PET scan is also needed to assist in further diagnostic studies including possible biopsy.

## 2017-04-27 ENCOUNTER — Other Ambulatory Visit: Payer: Self-pay | Admitting: Gastroenterology

## 2017-04-27 DIAGNOSIS — R1312 Dysphagia, oropharyngeal phase: Secondary | ICD-10-CM

## 2017-04-27 NOTE — Progress Notes (Signed)
  Oncology Nurse Navigator Documentation  Navigator Location: [facility] (04/27/17 0800)   )Navigator Encounter Type: Introductory phone call (04/27/17 0800)   Abnormal Finding Date: 04/18/17 (04/27/17 0800)                     Barriers/Navigation Needs: Coordination of Care (04/27/17 0800)   Interventions: Coordination of Care (04/27/17 0800)   Coordination of Care: Appts (04/27/17 0800)        Acuity: Level 2 (04/27/17 0800)   Acuity Level 2: Assistance expediting appointments;Initial guidance, education and coordination as needed;Educational needs (04/27/17 0800)  phone call made to patient to introduce to navigator services and to give appt information. Reviewed all upcoming appts with pt's caregiver. Informed that appts will be mailed to him as well. Contact info given and instructed to call with questions. Pt's caregiver verbalized understanding. Nothing further needed at this time.    Time Spent with Patient: 45 (04/27/17 0800)

## 2017-04-29 NOTE — Addendum Note (Signed)
Addended by: Telford Nab on: 04/29/2017 02:38 PM   Modules accepted: Orders

## 2017-05-02 ENCOUNTER — Other Ambulatory Visit: Payer: Self-pay

## 2017-05-02 DIAGNOSIS — I10 Essential (primary) hypertension: Secondary | ICD-10-CM | POA: Insufficient documentation

## 2017-05-02 DIAGNOSIS — E785 Hyperlipidemia, unspecified: Secondary | ICD-10-CM | POA: Insufficient documentation

## 2017-05-03 ENCOUNTER — Ambulatory Visit: Payer: Medicare Other | Attending: Oncology

## 2017-05-03 DIAGNOSIS — J984 Other disorders of lung: Secondary | ICD-10-CM | POA: Insufficient documentation

## 2017-05-03 DIAGNOSIS — R918 Other nonspecific abnormal finding of lung field: Secondary | ICD-10-CM | POA: Diagnosis present

## 2017-05-03 LAB — BLOOD GAS, ARTERIAL
Acid-Base Excess: 6.9 mmol/L — ABNORMAL HIGH (ref 0.0–2.0)
BICARBONATE: 32 mmol/L — AB (ref 20.0–28.0)
FIO2: 0.21
O2 Saturation: 95 %
PCO2 ART: 46 mmHg (ref 32.0–48.0)
PH ART: 7.45 (ref 7.350–7.450)
PO2 ART: 72 mmHg — AB (ref 83.0–108.0)
Patient temperature: 37

## 2017-05-03 MED ORDER — ALBUTEROL SULFATE (2.5 MG/3ML) 0.083% IN NEBU
2.5000 mg | INHALATION_SOLUTION | Freq: Once | RESPIRATORY_TRACT | Status: AC
  Administered 2017-05-03: 2.5 mg via RESPIRATORY_TRACT
  Filled 2017-05-03: qty 3

## 2017-05-04 ENCOUNTER — Encounter
Admission: RE | Admit: 2017-05-04 | Discharge: 2017-05-04 | Disposition: A | Discharge status: Home / Self Care | Payer: Medicare Other | Source: Ambulatory Visit | Attending: Oncology | Admitting: Oncology

## 2017-05-04 DIAGNOSIS — R918 Other nonspecific abnormal finding of lung field: Secondary | ICD-10-CM | POA: Diagnosis present

## 2017-05-04 LAB — GLUCOSE, CAPILLARY: Glucose-Capillary: 86 mg/dL (ref 65–99)

## 2017-05-04 MED ORDER — FLUDEOXYGLUCOSE F - 18 (FDG) INJECTION
12.5400 | Freq: Once | INTRAVENOUS | Status: AC | PRN
  Administered 2017-05-04: 12.54 via INTRAVENOUS

## 2017-05-06 ENCOUNTER — Inpatient Hospital Stay: Payer: Medicare Other

## 2017-05-06 ENCOUNTER — Inpatient Hospital Stay: Payer: Medicare Other | Attending: Internal Medicine | Admitting: Internal Medicine

## 2017-05-06 ENCOUNTER — Ambulatory Visit (INDEPENDENT_AMBULATORY_CARE_PROVIDER_SITE_OTHER): Payer: Medicare Other | Admitting: Cardiothoracic Surgery

## 2017-05-06 ENCOUNTER — Encounter: Payer: Self-pay | Admitting: *Deleted

## 2017-05-06 ENCOUNTER — Encounter: Payer: Self-pay | Admitting: Cardiothoracic Surgery

## 2017-05-06 VITALS — BP 126/72 | HR 71 | Temp 97.9°F | Resp 18 | Ht 72.0 in | Wt 201.6 lb

## 2017-05-06 DIAGNOSIS — K219 Gastro-esophageal reflux disease without esophagitis: Secondary | ICD-10-CM | POA: Diagnosis not present

## 2017-05-06 DIAGNOSIS — Z7982 Long term (current) use of aspirin: Secondary | ICD-10-CM | POA: Diagnosis not present

## 2017-05-06 DIAGNOSIS — E785 Hyperlipidemia, unspecified: Secondary | ICD-10-CM

## 2017-05-06 DIAGNOSIS — C3411 Malignant neoplasm of upper lobe, right bronchus or lung: Secondary | ICD-10-CM | POA: Diagnosis not present

## 2017-05-06 DIAGNOSIS — I739 Peripheral vascular disease, unspecified: Secondary | ICD-10-CM | POA: Diagnosis not present

## 2017-05-06 DIAGNOSIS — N400 Enlarged prostate without lower urinary tract symptoms: Secondary | ICD-10-CM | POA: Insufficient documentation

## 2017-05-06 DIAGNOSIS — Z79899 Other long term (current) drug therapy: Secondary | ICD-10-CM | POA: Diagnosis not present

## 2017-05-06 DIAGNOSIS — J984 Other disorders of lung: Secondary | ICD-10-CM | POA: Diagnosis not present

## 2017-05-06 DIAGNOSIS — I129 Hypertensive chronic kidney disease with stage 1 through stage 4 chronic kidney disease, or unspecified chronic kidney disease: Secondary | ICD-10-CM | POA: Insufficient documentation

## 2017-05-06 DIAGNOSIS — K769 Liver disease, unspecified: Secondary | ICD-10-CM | POA: Diagnosis not present

## 2017-05-06 DIAGNOSIS — R0602 Shortness of breath: Secondary | ICD-10-CM

## 2017-05-06 DIAGNOSIS — R1310 Dysphagia, unspecified: Secondary | ICD-10-CM | POA: Insufficient documentation

## 2017-05-06 DIAGNOSIS — N189 Chronic kidney disease, unspecified: Secondary | ICD-10-CM | POA: Diagnosis not present

## 2017-05-06 DIAGNOSIS — Z87891 Personal history of nicotine dependence: Secondary | ICD-10-CM | POA: Diagnosis not present

## 2017-05-06 DIAGNOSIS — I10 Essential (primary) hypertension: Secondary | ICD-10-CM | POA: Diagnosis not present

## 2017-05-06 DIAGNOSIS — I252 Old myocardial infarction: Secondary | ICD-10-CM

## 2017-05-06 DIAGNOSIS — R911 Solitary pulmonary nodule: Secondary | ICD-10-CM

## 2017-05-06 LAB — COMPREHENSIVE METABOLIC PANEL
ALBUMIN: 3.9 g/dL (ref 3.5–5.0)
ALT: 17 U/L (ref 17–63)
AST: 29 U/L (ref 15–41)
Alkaline Phosphatase: 56 U/L (ref 38–126)
Anion gap: 9 (ref 5–15)
BUN: 20 mg/dL (ref 6–20)
CHLORIDE: 98 mmol/L — AB (ref 101–111)
CO2: 30 mmol/L (ref 22–32)
CREATININE: 1.16 mg/dL (ref 0.61–1.24)
Calcium: 9.4 mg/dL (ref 8.9–10.3)
GFR calc non Af Amer: >60 mL/min (ref >60)
GLUCOSE: 113 mg/dL — AB (ref 65–99)
Potassium: 3 mmol/L — ABNORMAL LOW (ref 3.5–5.1)
SODIUM: 137 mmol/L (ref 135–145)
Total Bilirubin: 0.7 mg/dL (ref 0.3–1.2)
Total Protein: 8.3 g/dL — ABNORMAL HIGH (ref 6.5–8.1)

## 2017-05-06 LAB — CBC WITH DIFFERENTIAL/PLATELET
Basophils Absolute: 0.1 10*3/uL (ref 0–0.1)
Basophils Relative: 2 %
EOS ABS: 0.2 10*3/uL (ref 0–0.7)
Eosinophils Relative: 3 %
HCT: 43.3 % (ref 40.0–52.0)
HEMOGLOBIN: 14.3 g/dL (ref 13.0–18.0)
LYMPHS ABS: 1.9 10*3/uL (ref 1.0–3.6)
Lymphocytes Relative: 37 %
MCH: 27.6 pg (ref 26.0–34.0)
MCHC: 33 g/dL (ref 32.0–36.0)
MCV: 83.7 fL (ref 80.0–100.0)
MONOS PCT: 13 %
Monocytes Absolute: 0.7 10*3/uL (ref 0.2–1.0)
NEUTROS PCT: 45 %
Neutro Abs: 2.3 10*3/uL (ref 1.4–6.5)
Platelets: 256 10*3/uL (ref 150–440)
RBC: 5.17 MIL/uL (ref 4.40–5.90)
RDW: 14.4 % (ref 11.5–14.5)
WBC: 5.2 10*3/uL (ref 3.8–10.6)

## 2017-05-06 NOTE — Progress Notes (Signed)
Patient ID: Ruben Diaz, male   DOB: 03-15-1946, 72 y.o.   MRN: [id number]  Chief Complaint  Patient presents with  . New Patient (Initial Visit)    Lung Nodule -referred dr.Brahmanday    Referred By Dr. Iris Pert Monday Reason for Referral right upper lobe mass  HPI Location, Quality, Duration, Severity, Timing, Context, Modifying Factors, Associated Signs and Symptoms.  Ruben Diaz is a 72 y.o. male.  He states that he is a patient of Dr. Shanon Brow Fitzgerald's.  He had a CT scan done for an unknown reason.  We believe that this was for a CT lung cancer screening.  On that film he had a suspicious lesion in his right upper lobe and then subsequently had a PET scan.  The PET scan revealed some uptake in the liver and there is some question as to whether or not this could be a metastatic lesion.  The PET scan also showed uptake in the 2 cm right upper lobe lesion it was felt to represent a primary carcinoma.  Patient does have a history of smoking.  The CT scan did not demonstrate evidence of metastatic disease.  The patient states he quit smoking 2-3 months ago.  He states he is occasionally short of breath but denies any prior history of stroke or myocardial infarction.  He did undergo ulcer surgery at [facility] a couple years ago.   Past Medical History:  Diagnosis Date  . Chronic kidney disease   . Dysphagia   . GERD (gastroesophageal reflux disease)   . History of BPH   . Hyperlipidemia   . Hypertension   . Myocardial infarction ([place])   . Peripheral vascular disease [place])     Past Surgical History:  Procedure Laterality Date  . APPENDECTOMY    . COLONOSCOPY WITH PROPOFOL N/A 09/16/2016   Procedure: COLONOSCOPY WITH PROPOFOL;  Surgeon: [name], MD;  Location: [facility] ENDOSCOPY;  Service: Endoscopy;  Laterality: N/A;  . ESOPHAGOGASTRODUODENOSCOPY (EGD) WITH PROPOFOL N/A 09/16/2016   Procedure: ESOPHAGOGASTRODUODENOSCOPY (EGD) WITH PROPOFOL;  Surgeon: [name], MD;  Location: [facility] ENDOSCOPY;  Service: Endoscopy;  Laterality: N/A;  . ESOPHAGOGASTRODUODENOSCOPY (EGD) WITH PROPOFOL N/A 12/16/2016   Procedure: ESOPHAGOGASTRODUODENOSCOPY (EGD) WITH PROPOFOL;  Surgeon: [name], MD;  Location: [facility] ENDOSCOPY;  Service: Endoscopy;  Laterality: N/A;  . STOMACH SURGERY      History reviewed. No pertinent family history.  Social History Social History   Tobacco Use  . Smoking status: Former Smoker    Packs/day: 0.70    Years: 51.00    Pack years: 35.70    Last attempt to quit: 2013    Years since quitting: 6.1  . Smokeless tobacco: Never Used  Substance Use Topics  . Alcohol use: No  . Drug use: No    Allergies  Allergen Reactions  . Sucralfate Other (See Comments)    Current Outpatient Medications  Medication Sig Dispense Refill  . amLODipine (NORVASC) 10 MG tablet Take 10 mg by mouth daily.    Marland Kitchen aspirin EC 81 MG tablet Take 81 mg by mouth daily.    Marland Kitchen atorvastatin (LIPITOR) 10 MG tablet Take 10 mg by mouth daily.    . hydrochlorothiazide (HYDRODIURIL) 25 MG tablet Take 25 mg by mouth daily.    . pantoprazole (PROTONIX) 20 MG tablet Take 20 mg by mouth daily.    . pantoprazole (PROTONIX) 40 MG tablet     . sucralfate (CARAFATE) 1 g tablet Take by mouth.  No current facility-administered medications for this visit.       Review of Systems A complete review of systems was asked and was negative except for the following positive findings irregular heartbeat, cough, shortness of breath,  Blood pressure 126/72, pulse 71, temperature 97.9 F (36.6 C), temperature source Oral, resp. rate 18, height 6' (1.829 m), weight 201 lb 9.6 oz (91.4 kg), SpO2 97 %.  Physical Exam CONSTITUTIONAL:  Pleasant, well-developed, well-nourished, and in no acute distress. EYES: Pupils equal and reactive to light, Sclera non-icteric EARS, NOSE, MOUTH AND THROAT:  The oropharynx was clear.  Dentition is poor repair.  Oral mucosa pink and  moist. LYMPH NODES:  Lymph nodes in the neck and axillae were normal RESPIRATORY:  Lungs were clear.  Normal respiratory effort without pathologic use of accessory muscles of respiration CARDIOVASCULAR: Heart was regular without murmurs.  There were no carotid bruits. GI: The abdomen was soft, nontender, and nondistended. There were no palpable masses. There was no hepatosplenomegaly. There were normal bowel sounds in all quadrants.  He had a healed midline wound.  There is a small ventral hernia GU:  Rectal deferred.   MUSCULOSKELETAL:  Normal muscle strength and tone.  No clubbing or cyanosis.   SKIN:  There were no pathologic skin lesions.  There were no nodules on palpation. NEUROLOGIC:  Sensation is normal.  Cranial nerves are grossly intact. PSYCH:  Oriented to person, place and time.  Mood and affect are normal.  He appears to have limited insight into his medical condition  Data Reviewed CT scan and PET scan  I have personally reviewed the patient's imaging, laboratory findings and medical records.    Assessment    I have independently reviewed the patient's CT scan and PET scan.  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  It is close to the sternal costal margin.  I also saw the lesion in the liver.  It is unclear what this may represent.    Plan    The patient is scheduled for an MRI of the liver.  He will follow-up with me once that is complete.  I asked him if he could bring his family with him at the next visit so that we can review his treatment options.  Of note is that his FEV1 and DLCO are approximately 60% of predicted.    [name], MD 05/06/2017, 10:51 AM

## 2017-05-06 NOTE — Assessment & Plan Note (Addendum)
#  Right upper lobe lung nodule 19 mm lung nodule to be 21.  Likely stage I.  Highly suspicious for malignancy based upon significant SUV uptake on the PET scan.  No evidence of any adenopathy or distant metastatic disease-except for mild uptake in the liver [see discussion below].  Awaiting thoracic surgery evaluation morning.  Patient's PFTs seem to be adequate.   #Liver lesion-subtle based on the PET scan.  Unclear etiology-recommend hepatitis workup/AFP CEA.  Also recommend MRI with and without contrast ASAP for further evaluation.  #Long discussion with the patient regarding the above plan of care.  He agrees.  He was given a copy of his scans.  #Patient will follow-up with me based upon above workup; results of the MRI etc.   Patient will proceed with surgery for his lung mass based upon the workup of his liver lesion. I also discussed that we will plan with Dr. Faith Rogue.  Also discussed the tumor conference.  # Thank you for allowing me to participate in the care of your pleasant patient. Please do not hesitate to contact me with questions or concerns in the interim.

## 2017-05-06 NOTE — Progress Notes (Signed)
[facility] NOTE  Patient Care Team: Leonel Ramsay, MD as PCP - General (Infectious Diseases) Telford Nab, RN as Registered Nurse  CHIEF COMPLAINTS/PURPOSE OF CONSULTATION:  RIght upper lobe lung nodule  #  RUL nodule-approximately 19 mm in size  with SUV of 21 [LDCT]  # Liver lesion/uptake on PET - incidental   No history exists.     HISTORY OF PRESENTING ILLNESS:  Ruben Diaz 72 y.o.  male with a history of smoking quit about 6-7 years ago-noted to have a right upper lobe lung nodule on a screening lung CT scan.  He has been further been referred to [place] and thoracic surgery for further evaluation recommendations. As a part of the workup patient right upper lobe lung nodule; PET scan showed 19 mm right upper lobe lung nodule with SUV of 21; also showed a subtle lesion in the liver.  Patient has mild chronic shortness of breath.  No unusual cough or hemoptysis.  Denies any pain.  Denies any hemoptysis.  No headaches.  No chest pain.  Complains of mild chronic tingling in his left hand.   ROS: A complete 10 point review of system is done which is negative except mentioned above in history of present illness  MEDICAL HISTORY:  Past Medical History:  Diagnosis Date  . Chronic kidney disease   . Dysphagia   . GERD (gastroesophageal reflux disease)   . History of BPH   . Hyperlipidemia   . Hypertension   . Myocardial infarction ([place])   . Peripheral vascular disease ([place])     SURGICAL HISTORY: Past Surgical History:  Procedure Laterality Date  . APPENDECTOMY    . COLONOSCOPY WITH PROPOFOL N/A 09/16/2016   Procedure: COLONOSCOPY WITH PROPOFOL;  Surgeon: [name], MD;  Location: [facility] ENDOSCOPY;  Service: Endoscopy;  Laterality: N/A;  . ESOPHAGOGASTRODUODENOSCOPY (EGD) WITH PROPOFOL N/A 09/16/2016   Procedure: ESOPHAGOGASTRODUODENOSCOPY (EGD) WITH PROPOFOL;  Surgeon: [name], MD;  Location: [facility] ENDOSCOPY;  Service:  Endoscopy;  Laterality: N/A;  . ESOPHAGOGASTRODUODENOSCOPY (EGD) WITH PROPOFOL N/A 12/16/2016   Procedure: ESOPHAGOGASTRODUODENOSCOPY (EGD) WITH PROPOFOL;  Surgeon: [name], MD;  Location: [facility] ENDOSCOPY;  Service: Endoscopy;  Laterality: N/A;  . STOMACH SURGERY      SOCIAL HISTORY: lives in [place]; friend- geraldine; has 1 daughter;  Smoker quit.  Used work hosiery; previous alcohol; currently none.  Social History   Socioeconomic History  . Marital status: Single    Spouse name: Not on file  . Number of children: Not on file  . Years of education: Not on file  . Highest education level: Not on file  Social Needs  . Financial resource strain: Not on file  . Food insecurity - worry: Not on file  . Food insecurity - inability: Not on file  . Transportation needs - medical: Not on file  . Transportation needs - non-medical: Not on file  Occupational History  . Not on file  Tobacco Use  . Smoking status: Former Smoker    Packs/day: 0.70    Years: 51.00    Pack years: 35.70    Last attempt to quit: 2013    Years since quitting: 6.1  . Smokeless tobacco: Never Used  Substance and Sexual Activity  . Alcohol use: No  . Drug use: No  . Sexual activity: Not on file  Other Topics Concern  . Not on file  Social History Narrative  . Not on file    FAMILY  HISTORY: No history of cancer in the family. No family history on file.  ALLERGIES:  is allergic to sucralfate.  MEDICATIONS:  Current Outpatient Medications  Medication Sig Dispense Refill  . amLODipine (NORVASC) 10 MG tablet Take 10 mg by mouth daily.    Marland Kitchen aspirin EC 81 MG tablet Take 81 mg by mouth daily.    Marland Kitchen atorvastatin (LIPITOR) 10 MG tablet Take 10 mg by mouth daily.    . hydrochlorothiazide (HYDRODIURIL) 25 MG tablet Take 25 mg by mouth daily.    . pantoprazole (PROTONIX) 20 MG tablet Take 20 mg by mouth daily.    . sucralfate (CARAFATE) 1 g tablet Take by mouth.    . pantoprazole (PROTONIX) 40 MG  tablet      No current facility-administered medications for this visit.       Marland Kitchen  PHYSICAL EXAMINATION: ECOG PERFORMANCE STATUS: 0 - Asymptomatic  Vitals:   05/06/17 0917  BP: 121/73  Pulse: 65  Resp: 16  Temp: (!) 97.1 F (36.2 C)   Filed Weights   05/06/17 0917  Weight: 201 lb 9.6 oz (91.4 kg)    GENERAL: Well-nourished well-developed; Alert, no distress and comfortable.   Alone. EYES: no pallor or icterus OROPHARYNX: no thrush or ulceration; good dentition  NECK: supple, no masses felt LYMPH:  no palpable lymphadenopathy in the cervical, axillary or inguinal regions LUNGS: clear to auscultation and  No wheeze or crackles HEART/CVS: regular rate & rhythm and no murmurs; No lower extremity edema ABDOMEN: abdomen soft, non-tender and normal bowel sounds Musculoskeletal:no cyanosis of digits and no clubbing  PSYCH: alert & oriented x 3 with fluent speech NEURO: no focal motor/sensory deficits SKIN:  no rashes or significant lesions  LABORATORY DATA:  I have reviewed the data as listed Lab Results  Component Value Date   WBC 5.2 05/06/2017   HGB 14.3 05/06/2017   HCT 43.3 05/06/2017   MCV 83.7 05/06/2017   PLT 256 05/06/2017   Recent Labs    05/06/17 0950  NA 137  K 3.0*  CL 98*  CO2 30  GLUCOSE 113*  BUN 20  CREATININE 1.16  CALCIUM 9.4  GFRNONAA >60  GFRAA >60  PROT 8.3*  ALBUMIN 3.9  AST 29  ALT 17  ALKPHOS 56  BILITOT 0.7    RADIOGRAPHIC STUDIES: I have personally reviewed the radiological images as listed and agreed with the findings in the report. Nm Pet Image Initial (pi) Skull Base To Thigh  Result Date: 05/04/2017 CLINICAL DATA:  Initial treatment strategy for right upper lobe lung lesion. EXAM: NUCLEAR MEDICINE PET SKULL BASE TO THIGH TECHNIQUE: 12.5 mCi F-18 FDG was injected intravenously. Full-ring PET imaging was performed from the skull base to thigh after the radiotracer. CT data was obtained and used for attenuation correction and  anatomic localization. FASTING BLOOD GLUCOSE:  Value: 86 mg/dl COMPARISON:  Chest CT 04/18/2017 and 04/06/2016 FINDINGS: NECK: No hypermetabolic lymph nodes in the neck. CHEST: The 19 mm right upper lobe lung lesion is hypermetabolic with SUV max of 42.5. No enlarged or hypermetabolic mediastinal or hilar lymph nodes. No other worrisome pulmonary lesions. Stable advanced atherosclerotic calcifications involving the aorta and coronary arteries. ABDOMEN/PELVIS: Focal area of hypermetabolism is noted in the liver near the gallbladder fossa. This is just anterior and superior to a low-attenuation lesion which is a benign cyst. Other smaller cysts are noted in the liver. No other hypermetabolic lesions are identified. MRI may be helpful for further evaluation. No other significant  findings in the abdomen. No adrenal gland metastasis. No abdominal/pelvic lymphadenopathy. Advanced atherosclerotic calcifications involving the aorta and branch vessels. Hypermetabolism is noted in both testicles which can be normal. Bilateral scrotal hydroceles are noted. SKELETON: No focal hypermetabolic activity to suggest skeletal metastasis. IMPRESSION: 1. Markedly hypermetabolic 19 mm right upper lobe pulmonary lesion consistent with primary lung neoplasm. No enlarged or hypermetabolic mediastinal/hilar lymph nodes. 2. Single (poorly visualized on CT) hypermetabolic hepatic lesion near the gallbladder fossa. This could be a metastatic focus. Gallbladder cancer would be another possibility. MRI abdomen without and with contrast may be helpful for further evaluation. 3. No other findings to suggest metastatic disease. Electronically Signed   By: Marijo Sanes M.D.   On: 05/04/2017 12:11   Ct Chest Lung Cancer Screening Low Dose Wo Contrast  Result Date: 04/18/2017 CLINICAL DATA:  72 year old male former smoker (quit in 2013) with 36 pack year history of smoking. Lung cancer screening examination. EXAM: CT CHEST WITHOUT CONTRAST  LOW-DOSE FOR LUNG CANCER SCREENING TECHNIQUE: Multidetector CT imaging of the chest was performed following the standard protocol without IV contrast. COMPARISON:  Low-dose lung cancer screening chest CT 04/06/2016. FINDINGS: Cardiovascular: Heart size is normal. There is no significant pericardial fluid, thickening or pericardial calcification. There is aortic atherosclerosis, as well as atherosclerosis of the great vessels of the mediastinum and the coronary arteries, including calcified atherosclerotic plaque in the left main, left anterior descending, left circumflex and right coronary arteries. Calcifications of the aortic valve. Mediastinum/Nodes: No pathologically enlarged mediastinal or hilar lymph nodes. Please note that accurate exclusion of hilar adenopathy is limited on noncontrast CT scans. Esophagus is unremarkable in appearance. No axillary lymphadenopathy. Lungs/Pleura: Several pulmonary nodules are again noted in the lungs, however, there are 2 new nodules in the anterior aspect of the right upper lobe. One of these is a non solid lesion with multiple internal cystic areas with a volume derived mean diameter of 12.9 mm (axial image 73 of series 3). The most concerning lesion is a solid lesion in the anterior aspect of the right upper lobe near the apex just above the other previously described lesion, best visualized on axial image 58 of series 3, with a volume derived mean diameter of 16.9 mm. Mild diffuse bronchial wall thickening with mild centrilobular and paraseptal emphysema. No acute consolidative airspace disease. No pleural effusions. Upper Abdomen: Aortic atherosclerosis. New 9 mm low-attenuation lesion in segment 2 of the liver (axial image 54 of series 2) is incompletely characterized on today's noncontrast CT examination. Musculoskeletal: There are no aggressive appearing lytic or blastic lesions noted in the visualized portions of the skeleton. IMPRESSION: 1. Lung-RADS 4B, suspicious.  Additional imaging evaluation or consultation with Pulmonology or Thoracic Surgery recommended. 2. The "S" modifier above refers to potentially clinically significant non lung cancer related findings. Specifically, there is aortic atherosclerosis, in addition to left main and 3 vessel coronary artery disease. Assessment for potential risk factor modification, dietary therapy or pharmacologic therapy may be warranted, if clinically indicated. 3. Diffuse bronchial wall thickening with mild centrilobular and paraseptal emphysema; imaging findings suggestive of underlying COPD. 4. There are calcifications of the aortic valve. Echocardiographic correlation for evaluation of potential valvular dysfunction may be warranted if clinically indicated. Aortic Atherosclerosis (ICD10-I70.0) and Emphysema (ICD10-J43.9). Electronically Signed   By: Vinnie Langton M.D.   On: 04/18/2017 11:51    ASSESSMENT & PLAN:   Nodule of upper lobe of right lung #Right upper lobe lung nodule 19 mm lung nodule to be  21.  Likely stage I.  Highly suspicious for malignancy based upon significant SUV uptake on the PET scan.  No evidence of any adenopathy or distant metastatic disease-except for mild uptake in the liver [see discussion below].  Awaiting thoracic surgery evaluation morning.  Patient's PFTs seem to be adequate.   #Liver lesion-subtle based on the PET scan.  Unclear etiology-recommend hepatitis workup/AFP CEA.  Also recommend MRI with and without contrast ASAP for further evaluation.  #Long discussion with the patient regarding the above plan of care.  He agrees.  He was given a copy of his scans.  #Patient will follow-up with me based upon above workup; results of the MRI etc.   Patient will proceed with surgery for his lung mass based upon the workup of his liver lesion. I also discussed that we will plan with Dr. Faith Rogue.  Also discussed the tumor conference.  # Thank you for allowing me to participate in the care of  your pleasant patient. Please do not hesitate to contact me with questions or concerns in the interim.  All questions were answered. The patient knows to call the clinic with any problems, questions or concerns.   Cammie Sickle, MD 05/07/2017 7:15 AM

## 2017-05-06 NOTE — Progress Notes (Signed)
Met with patient at initial medical oncology consultation. Introduced navigation program. Reviewed in detail plan of care, to include consultation with thoracic surgery, MRI evaluation of abnormal liver finding. Assisted patient with coordination of lab visit and escorted to thoracic surgery consultation. Patient is given contact information. 

## 2017-05-06 NOTE — Patient Instructions (Signed)
Please bring some family members with you to your appointment on 05/13/17 to see Dr.Oaks.

## 2017-05-07 LAB — AFP TUMOR MARKER: AFP, Serum, Tumor Marker: 3 ng/mL (ref 0.0–8.3)

## 2017-05-07 LAB — CANCER ANTIGEN 19-9: CA 19-9: 22 U/mL (ref 0–35)

## 2017-05-07 LAB — HEPATITIS C ANTIBODY: HCV Ab: <0.1 s/co ratio (ref 0.0–0.9)

## 2017-05-07 LAB — CEA: CEA1: 1.9 ng/mL (ref 0.0–4.7)

## 2017-05-07 LAB — HEPATITIS B SURFACE ANTIGEN: HEP B S AG: NEGATIVE

## 2017-05-07 LAB — HEPATITIS B CORE ANTIBODY, IGM: HEP B C IGM: NEGATIVE

## 2017-05-12 ENCOUNTER — Ambulatory Visit
Admission: RE | Admit: 2017-05-12 | Discharge: 2017-05-12 | Disposition: A | Discharge status: Home / Self Care | Payer: Medicare Other | Source: Ambulatory Visit | Attending: Internal Medicine | Admitting: Internal Medicine

## 2017-05-12 DIAGNOSIS — K769 Liver disease, unspecified: Secondary | ICD-10-CM | POA: Diagnosis not present

## 2017-05-12 DIAGNOSIS — K8020 Calculus of gallbladder without cholecystitis without obstruction: Secondary | ICD-10-CM | POA: Insufficient documentation

## 2017-05-12 MED ORDER — GADOBENATE DIMEGLUMINE 529 MG/ML IV SOLN
19.0000 mL | Freq: Once | INTRAVENOUS | Status: AC | PRN
  Administered 2017-05-12: 19 mL via INTRAVENOUS

## 2017-05-13 ENCOUNTER — Encounter: Payer: Self-pay | Admitting: Cardiothoracic Surgery

## 2017-05-13 ENCOUNTER — Ambulatory Visit (INDEPENDENT_AMBULATORY_CARE_PROVIDER_SITE_OTHER): Payer: Medicare Other | Admitting: Cardiothoracic Surgery

## 2017-05-13 VITALS — BP 130/64 | HR 65 | Temp 97.9°F | Resp 18 | Ht 72.0 in | Wt 208.2 lb

## 2017-05-13 DIAGNOSIS — J984 Other disorders of lung: Secondary | ICD-10-CM

## 2017-05-13 NOTE — Progress Notes (Signed)
  Patient ID: Ruben Diaz, male   DOB: [birth year], 72 y.o.   MRN: [id number]  HISTORY: He returns today in follow-up.  He did have a set of pulmonary function studies done as well as an MRI of his liver.  His pulmonary function studies reveal an FEV1 and DLCO of approximately 60% predicted.  His MRI of his abdomen did not reveal any evidence of tumor within the liver.  He was accompanied today by his daughter and I again reviewed all of his x-rays.   Vitals:   05/13/17 1102  BP: 130/64  Pulse: 65  Resp: 18  Temp: 97.9 F (36.6 C)  SpO2: 96%     EXAM:    Resp: Lungs are clear bilaterally.  No respiratory distress, normal effort. Heart:  Regular without murmurs Abd:  Abdomen is soft, non distended and non tender. No masses are palpable.  There is no rebound and no guarding.  Neurological: Alert and oriented to person, place, and time. Coordination normal.  Skin: Skin is warm and dry. No rash noted. No diaphoretic. No erythema. No pallor.  Psychiatric: Normal mood and affect. Normal behavior. Judgment and thought content normal.    ASSESSMENT: There is a right upper lobe mass which abuts the chest wall medially and superiorly.  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   PLAN:   I had a long discussion today with the family regarding the options.  I told him that I thought for almost certain that this was a lung cancer and that we had no evidence that it had spread elsewhere.  Options at this point would include preoperative chemotherapy to try to reduce the size of the tumor such that surgical intervention could be done at a later date easier without chest wall involvement.  Other options would include direct surgical resection with or without radiation therapy and chemotherapy postoperatively for a positive margin.  Other options would include definitive radiation therapy and/or chemotherapy.  The family had many questions and they will meet with Dr.  Rogue Bussing to review those options.  Once they meet with Dr. Iris Pert Monday they will come back and talk to [place] about definitive recommendations.    [name], MD

## 2017-05-13 NOTE — Patient Instructions (Signed)
Please go and see Dr. Rogue Bussing and then let us know what your decision will be. Once we schedule the appointment, I will call you.

## 2017-05-16 ENCOUNTER — Encounter: Payer: Self-pay | Admitting: *Deleted

## 2017-05-16 ENCOUNTER — Encounter: Payer: Self-pay | Admitting: Internal Medicine

## 2017-05-16 ENCOUNTER — Inpatient Hospital Stay (HOSPITAL_BASED_OUTPATIENT_CLINIC_OR_DEPARTMENT_OTHER): Payer: Medicare Other | Admitting: Internal Medicine

## 2017-05-16 DIAGNOSIS — I129 Hypertensive chronic kidney disease with stage 1 through stage 4 chronic kidney disease, or unspecified chronic kidney disease: Secondary | ICD-10-CM

## 2017-05-16 DIAGNOSIS — C3411 Malignant neoplasm of upper lobe, right bronchus or lung: Secondary | ICD-10-CM | POA: Diagnosis not present

## 2017-05-16 DIAGNOSIS — N400 Enlarged prostate without lower urinary tract symptoms: Secondary | ICD-10-CM | POA: Diagnosis not present

## 2017-05-16 DIAGNOSIS — R0602 Shortness of breath: Secondary | ICD-10-CM

## 2017-05-16 DIAGNOSIS — N189 Chronic kidney disease, unspecified: Secondary | ICD-10-CM | POA: Diagnosis not present

## 2017-05-16 DIAGNOSIS — I739 Peripheral vascular disease, unspecified: Secondary | ICD-10-CM

## 2017-05-16 DIAGNOSIS — R1310 Dysphagia, unspecified: Secondary | ICD-10-CM

## 2017-05-16 DIAGNOSIS — Z87891 Personal history of nicotine dependence: Secondary | ICD-10-CM

## 2017-05-16 DIAGNOSIS — Z7982 Long term (current) use of aspirin: Secondary | ICD-10-CM | POA: Diagnosis not present

## 2017-05-16 DIAGNOSIS — K769 Liver disease, unspecified: Secondary | ICD-10-CM | POA: Diagnosis not present

## 2017-05-16 DIAGNOSIS — R911 Solitary pulmonary nodule: Secondary | ICD-10-CM

## 2017-05-16 DIAGNOSIS — E785 Hyperlipidemia, unspecified: Secondary | ICD-10-CM

## 2017-05-16 DIAGNOSIS — Z79899 Other long term (current) drug therapy: Secondary | ICD-10-CM

## 2017-05-16 DIAGNOSIS — I252 Old myocardial infarction: Secondary | ICD-10-CM | POA: Diagnosis not present

## 2017-05-16 DIAGNOSIS — K219 Gastro-esophageal reflux disease without esophagitis: Secondary | ICD-10-CM | POA: Diagnosis not present

## 2017-05-16 NOTE — Progress Notes (Signed)
Start NOTE  Patient Care Team: Leonel Ramsay, MD as PCP - General (Infectious Diseases) Telford Nab, RN as Registered Nurse  CHIEF COMPLAINTS/PURPOSE OF CONSULTATION:     Oncology History   #  RUL nodule-approximately 19 mm in size  with SUV of 21 [LDCT]  # 2019 Feb Liver lesion/uptake on PET - incidental- MRI liver -NEG     Primary cancer of right upper lobe of lung ([place] [facility])     HISTORY OF PRESENTING ILLNESS:  Nosson Wender 72 y.o.  male history of smoking/currently quit is here to discuss further treatment options for his right upper lobe lung nodule that was noted incidentally on screening CT scan.  Patient has been evaluated by thoracic surgery; however given the proximity of the right upper lobe lung nodule to the rib-he is recommended "neoadjuvant therapy".   Patient has mild chronic shortness of breath.  No unusual cough or hemoptysis.  Denies any pain.  Denies any hemoptysis.  No headaches.  No chest pain.    ROS: A complete 10 point review of system is done which is negative except mentioned above in history of present illness  MEDICAL HISTORY:  Past Medical History:  Diagnosis Date  . Chronic kidney disease   . Dysphagia   . GERD (gastroesophageal reflux disease)   . History of BPH   . Hyperlipidemia   . Hypertension   . Myocardial infarction ([place])   . Peripheral vascular disease ([place])     SURGICAL HISTORY: Past Surgical History:  Procedure Laterality Date  . APPENDECTOMY    . COLONOSCOPY WITH PROPOFOL N/A 09/16/2016   Procedure: COLONOSCOPY WITH PROPOFOL;  Surgeon: [name], MD;  Location: [facility] ENDOSCOPY;  Service: Endoscopy;  Laterality: N/A;  . ESOPHAGOGASTRODUODENOSCOPY (EGD) WITH PROPOFOL N/A 09/16/2016   Procedure: ESOPHAGOGASTRODUODENOSCOPY (EGD) WITH PROPOFOL;  Surgeon: [name], MD;  Location: [facility] ENDOSCOPY;  Service: Endoscopy;  Laterality: N/A;  . ESOPHAGOGASTRODUODENOSCOPY (EGD) WITH  PROPOFOL N/A 12/16/2016   Procedure: ESOPHAGOGASTRODUODENOSCOPY (EGD) WITH PROPOFOL;  Surgeon: [name], MD;  Location: [facility] ENDOSCOPY;  Service: Endoscopy;  Laterality: N/A;  . STOMACH SURGERY      SOCIAL HISTORY: lives in [place]; friend- geraldine; has 1 daughter;  Smoker quit.  Used work hosiery; previous alcohol; currently none.  Social History   Socioeconomic History  . Marital status: Single    Spouse name: Not on file  . Number of children: Not on file  . Years of education: Not on file  . Highest education level: Not on file  Social Needs  . Financial resource strain: Not on file  . Food insecurity - worry: Not on file  . Food insecurity - inability: Not on file  . Transportation needs - medical: Not on file  . Transportation needs - non-medical: Not on file  Occupational History  . Not on file  Tobacco Use  . Smoking status: Former Smoker    Packs/day: 0.70    Years: 51.00    Pack years: 35.70    Last attempt to quit: 2013    Years since quitting: 6.1  . Smokeless tobacco: Never Used  Substance and Sexual Activity  . Alcohol use: No  . Drug use: No  . Sexual activity: Not on file  Other Topics Concern  . Not on file  Social History Narrative  . Not on file    FAMILY HISTORY: No history of cancer in the family. History reviewed. No pertinent family history.  ALLERGIES:  is allergic to sucralfate.  MEDICATIONS:  Current Outpatient Medications  Medication Sig Dispense Refill  . amLODipine (NORVASC) 10 MG tablet Take 10 mg by mouth daily.    Marland Kitchen aspirin EC 81 MG tablet Take 81 mg by mouth daily.    Marland Kitchen atorvastatin (LIPITOR) 10 MG tablet Take 10 mg by mouth daily.    . hydrochlorothiazide (HYDRODIURIL) 25 MG tablet Take 25 mg by mouth daily.    . pantoprazole (PROTONIX) 20 MG tablet Take 20 mg by mouth daily.    . pantoprazole (PROTONIX) 40 MG tablet     . sucralfate (CARAFATE) 1 g tablet Take by mouth.     No current facility-administered  medications for this visit.       Marland Kitchen  PHYSICAL EXAMINATION: ECOG PERFORMANCE STATUS: 0 - Asymptomatic  Vitals:   05/16/17 0923  BP: 128/77  Pulse: 61  Resp: 16  Temp: 98.2 F (36.8 C)   Filed Weights   05/16/17 0923  Weight: 206 lb 14.4 oz (93.8 kg)    GENERAL: Well-nourished well-developed; Alert, no distress and comfortable.  He is accompanied by his daughter. EYES: no pallor or icterus OROPHARYNX: no thrush or ulceration; good dentition  NECK: supple, no masses felt LYMPH:  no palpable lymphadenopathy in the cervical, axillary or inguinal regions LUNGS: clear to auscultation and  No wheeze or crackles HEART/CVS: regular rate & rhythm and no murmurs; No lower extremity edema ABDOMEN: abdomen soft, non-tender and normal bowel sounds Musculoskeletal:no cyanosis of digits and no clubbing  PSYCH: alert & oriented x 3 with fluent speech NEURO: no focal motor/sensory deficits SKIN:  no rashes or significant lesions  LABORATORY DATA:  I have reviewed the data as listed Lab Results  Component Value Date   WBC 5.2 05/06/2017   HGB 14.3 05/06/2017   HCT 43.3 05/06/2017   MCV 83.7 05/06/2017   PLT 256 05/06/2017   Recent Labs    05/06/17 0950  NA 137  K 3.0*  CL 98*  CO2 30  GLUCOSE 113*  BUN 20  CREATININE 1.16  CALCIUM 9.4  GFRNONAA >60  GFRAA >60  PROT 8.3*  ALBUMIN 3.9  AST 29  ALT 17  ALKPHOS 56  BILITOT 0.7    RADIOGRAPHIC STUDIES: I have personally reviewed the radiological images as listed and agreed with the findings in the report. Dg Op Swallowing Func-medicare/speech Path  Result Date: 05/17/2017 CLINICAL DATA:  Dysphagia. EXAM: MODIFIED BARIUM SWALLOW TECHNIQUE: Different consistencies of barium were administered orally to the patient by the Speech Pathologist. Imaging of the pharynx was performed in the lateral projection. FLUOROSCOPY TIME:  Fluoroscopy Time:  1 minute Radiation Exposure Index (if provided by the fluoroscopic device): 2.8 mGy  Number of Acquired Spot Images: 0 COMPARISON:  None. FINDINGS: Thin liquid- within normal limits Nectar thick liquid- within normal limits Pure- within normal limits Pure with cracker- within normal limits IMPRESSION: Normal exam. Please refer to the Speech Pathologists report for complete details and recommendations. Electronically Signed   By: Marcello Moores  Register   On: 05/17/2017 13:57   Mr Liver W Wo Contrast  Result Date: 05/12/2017 CLINICAL DATA:  Newly diagnosed right lung carcinoma. Hypermetabolic liver lesion on recent PET-CT. EXAM: MRI ABDOMEN WITHOUT AND WITH CONTRAST TECHNIQUE: Multiplanar multisequence MR imaging of the abdomen was performed both before and after the administration of intravenous contrast. CONTRAST:  38mL MULTIHANCE GADOBENATE DIMEGLUMINE 529 MG/ML IV SOLN COMPARISON:  PET-CT on 05/04/2017 FINDINGS: Lower chest: No acute findings. Hepatobiliary: Multiple small cysts  are seen in both the right and left hepatic lobes, however no masses are identified. A small gallbladder is seen which is filled with small gallstones. No evidence of acute cholecystitis or biliary ductal dilatation. Pancreas:  No mass or inflammatory changes. Spleen:  Within normal limits in size and appearance. Adrenals/Urinary Tract: No masses identified. No evidence of hydronephrosis. Stomach/Bowel: Visualized abdominal bowel unremarkable. Vascular/Lymphatic: No pathologically enlarged lymph nodes identified. No abdominal aortic aneurysm. Other:  None. Musculoskeletal:  No suspicious bone lesions identified. IMPRESSION: No evidence of hepatic or other abdominal metastatic disease. Cholelithiasis.  No radiographic evidence of cholecystitis. Electronically Signed   By: Earle Gell M.D.   On: 05/12/2017 10:46   Nm Pet Image Initial (pi) Skull Base To Thigh  Result Date: 05/04/2017 CLINICAL DATA:  Initial treatment strategy for right upper lobe lung lesion. EXAM: NUCLEAR MEDICINE PET SKULL BASE TO THIGH TECHNIQUE: 12.5  mCi F-18 FDG was injected intravenously. Full-ring PET imaging was performed from the skull base to thigh after the radiotracer. CT data was obtained and used for attenuation correction and anatomic localization. FASTING BLOOD GLUCOSE:  Value: 86 mg/dl COMPARISON:  Chest CT 04/18/2017 and 04/06/2016 FINDINGS: NECK: No hypermetabolic lymph nodes in the neck. CHEST: The 19 mm right upper lobe lung lesion is hypermetabolic with SUV max of 15.1. No enlarged or hypermetabolic mediastinal or hilar lymph nodes. No other worrisome pulmonary lesions. Stable advanced atherosclerotic calcifications involving the aorta and coronary arteries. ABDOMEN/PELVIS: Focal area of hypermetabolism is noted in the liver near the gallbladder fossa. This is just anterior and superior to a low-attenuation lesion which is a benign cyst. Other smaller cysts are noted in the liver. No other hypermetabolic lesions are identified. MRI may be helpful for further evaluation. No other significant findings in the abdomen. No adrenal gland metastasis. No abdominal/pelvic lymphadenopathy. Advanced atherosclerotic calcifications involving the aorta and branch vessels. Hypermetabolism is noted in both testicles which can be normal. Bilateral scrotal hydroceles are noted. SKELETON: No focal hypermetabolic activity to suggest skeletal metastasis. IMPRESSION: 1. Markedly hypermetabolic 19 mm right upper lobe pulmonary lesion consistent with primary lung neoplasm. No enlarged or hypermetabolic mediastinal/hilar lymph nodes. 2. Single (poorly visualized on CT) hypermetabolic hepatic lesion near the gallbladder fossa. This could be a metastatic focus. Gallbladder cancer would be another possibility. MRI abdomen without and with contrast may be helpful for further evaluation. 3. No other findings to suggest metastatic disease. Electronically Signed   By: Marijo Sanes M.D.   On: 05/04/2017 12:11    ASSESSMENT & PLAN:   Nodule of upper lobe of right  lung #Right upper lobe lung nodule 19 mm lung nodule to be 21.  Likely stage I.  Highly suspicious for malignancy based upon significant SUV uptake on the PET scan.  No evidence of any adenopathy or distant metastatic disease-except for mild uptake in the liver [see discussion below].    # s/p eval with Dr.Oaks-? Involvement of rib; will need extensive surgery.   #Liver lesion-subtle based on the PET scan. MRI liver -normal.   #Long discussion with the patient/daughter regarding the above plan of care.  He agrees.    # follow up few days after the CT biopsy re: possible chemo prior to surgery.   Primary cancer of right upper lobe of lung ([facility]) #Right upper lobe lung nodule 19 mm lung nodule to be 21.  Likely stage I.  Highly suspicious for malignancy based upon significant SUV uptake on the PET scan.   #Status post  evaluation with thoracic surgery-given the proximity to the right rib it is recommended patient get "neoadjuvant therapy" to facilitate the surgery.  Patient is a good surgical candidate.   #Would recommend biopsy of the lesion ASAP; and follow-up a few days later-to review the pathology/as this would be very important to recommend the type of systemic therapy.  #Liver lesion-status post MRI of the liver negative.  #Patient will follow-up with me few days after the results of the lung nodule biopsy to review the pathology; treatment plan. Discussed with lung navigator [place].  # 25 minutes face-to-face with the patient discussing the above plan of care; more than 50% of time spent on prognosis/ natural history; counseling and coordination.   All questions were answered. The patient knows to call the clinic with any problems, questions or concerns.   Cammie Sickle, MD 05/25/2017 9:52 AM

## 2017-05-16 NOTE — Progress Notes (Signed)
  Oncology Nurse Navigator Documentation  Navigator Location: [facility]-Med Onc (05/16/17 1300)   )Navigator Encounter Type: Follow-up Appt (05/16/17 1300)                     Patient Visit Type: MedOnc (05/16/17 1300)   Barriers/Navigation Needs: Coordination of Care (05/16/17 1300)   Interventions: Coordination of Care (05/16/17 1300)   Coordination of Care: Appts;Radiology (05/16/17 1300)             met with patient and caregiver during follow up visit with Dr. Rogue Bussing. All questions answered at the time of visit. Pt and caregiver informed that will be working on getting pt scheduled for CT guided biopsy then will follow up with Dr. B a few days after biopsy to review results. Message sent to Dr. Kathlene Cote to get approval for biopsy. Pt and caregiver informed that will be notified by phone with appt details for biopsy and follow up. Contact info given and instructed to call with any further questions or needs. Pt and caregiver verbalized understanding.     Time Spent with Patient: 30 (05/16/17 1300)

## 2017-05-16 NOTE — Assessment & Plan Note (Addendum)
#  Right upper lobe lung nodule 19 mm lung nodule to be 21.  Likely stage I.  Highly suspicious for malignancy based upon significant SUV uptake on the PET scan.  No evidence of any adenopathy or distant metastatic disease-except for mild uptake in the liver [see discussion below].    # s/p eval with Dr.Oaks-? Involvement of rib; will need extensive surgery.   #Liver lesion-subtle based on the PET scan. MRI liver -normal.   #Long discussion with the patient/daughter regarding the above plan of care.  He agrees.    # follow up few days after the CT biopsy re: possible chemo prior to surgery.

## 2017-05-17 ENCOUNTER — Ambulatory Visit
Admission: RE | Admit: 2017-05-17 | Discharge: 2017-05-17 | Disposition: A | Discharge status: Home / Self Care | Payer: Medicare Other | Source: Ambulatory Visit | Attending: Gastroenterology | Admitting: Gastroenterology

## 2017-05-17 DIAGNOSIS — R1312 Dysphagia, oropharyngeal phase: Secondary | ICD-10-CM

## 2017-05-17 DIAGNOSIS — K224 Dyskinesia of esophagus: Secondary | ICD-10-CM | POA: Insufficient documentation

## 2017-05-17 DIAGNOSIS — R1310 Dysphagia, unspecified: Secondary | ICD-10-CM

## 2017-05-17 DIAGNOSIS — Z8673 Personal history of transient ischemic attack (TIA), and cerebral infarction without residual deficits: Secondary | ICD-10-CM | POA: Insufficient documentation

## 2017-05-17 NOTE — Therapy (Addendum)
[place], [place], [address] Phone: [phone]   Fax:     Modified Barium Swallow  Patient Details  Name: Ruben Diaz MRN: [id number] Date of Birth: [birth year] No Data Recorded  Encounter Date: 05/17/2017  End of Session - 05/17/17 1715    Visit Number  1    Number of Visits  1    Date for SLP Re-Evaluation  05/17/17    SLP Start Time  1205    SLP Stop Time   1305    SLP Time Calculation (min)  60 min    Activity Tolerance  Patient tolerated treatment well       Past Medical History:  Diagnosis Date  . Chronic kidney disease   . Dysphagia   . GERD (gastroesophageal reflux disease)   . History of BPH   . Hyperlipidemia   . Hypertension   . Myocardial infarction ([place])   . Peripheral vascular disease [facility])     Past Surgical History:  Procedure Laterality Date  . APPENDECTOMY    . COLONOSCOPY WITH PROPOFOL N/A 09/16/2016   Procedure: COLONOSCOPY WITH PROPOFOL;  Surgeon: [name], MD;  Location: [facility] ENDOSCOPY;  Service: Endoscopy;  Laterality: N/A;  . ESOPHAGOGASTRODUODENOSCOPY (EGD) WITH PROPOFOL N/A 09/16/2016   Procedure: ESOPHAGOGASTRODUODENOSCOPY (EGD) WITH PROPOFOL;  Surgeon: [name], MD;  Location: [facility] ENDOSCOPY;  Service: Endoscopy;  Laterality: N/A;  . ESOPHAGOGASTRODUODENOSCOPY (EGD) WITH PROPOFOL N/A 12/16/2016   Procedure: ESOPHAGOGASTRODUODENOSCOPY (EGD) WITH PROPOFOL;  Surgeon: [name], MD;  Location: [facility] ENDOSCOPY;  Service: Endoscopy;  Laterality: N/A;  . STOMACH SURGERY      There were no vitals filed for this visit.      Subjective: Patient behavior: (alertness, ability to follow instructions, etc.): pt alert and verbally engaged w/ SLP answering questions, following directions. Daughter present w/ pt. Pt c/o a "stuck" feeling when eating foods - he pointed to the sternal notch and area ~2 inches BELOW. Pt is edentulous and  stated that "some foods are just hard to chew". Pt denied any difficulty swallowing liquids when "just drinking something".  Chief complaint: dysphagia. Pt is Edentulous.   Objective:  Radiological Procedure: A videoflouroscopic evaluation of oral-preparatory, reflex initiation, and pharyngeal phases of the swallow was performed; as well as a screening of the upper esophageal phase.  I. POSTURE: upright II. VIEW: lateral III. COMPENSATORY STRATEGIES: none IV. BOLUSES ADMINISTERED:  Thin Liquid: 5 trials  Nectar-thick Liquid: 1 trial  Honey-thick Liquid: NT  Puree: 3 trials  Mechanical Soft: 3 trials V. RESULTS OF EVALUATION: A. ORAL PREPARATORY PHASE: (The lips, tongue, and velum are observed for strength and coordination)       **Overall Severity Rating: grossly WFL w/ liquids, purees, softened foods. Pt exhibited timely bolus management and A-P transfer w/ all boluses; oral clearing was appropriate w/ all consistencies. Pt required min increased mashing/gumming time w/ the trials of increased textured foods - softened solids.   B. SWALLOW INITIATION/REFLEX: (The reflex is normal if "triggered" by the time the bolus reached the base of the tongue)  **Overall Severity Rating: [facility]. Pharyngeal swallow initiation was timely(for pt's age) at [facility] for trials w/ timely and appropriate airway closure during the swallow. No laryngeal penetration or aspiration occurred.   C. PHARYNGEAL PHASE: (Pharyngeal function is normal if the bolus shows rapid, smooth, and continuous transit through the pharynx and there is no pharyngeal residue after the swallow)  **Overall Severity Rating: [facility].  Appropriate pharyngeal clearing of all bolus consistencies was noted - no pharyngeal residue remained. This indicates adequate laryngeal excursion and pharyngeal pressure during the swallow.   D. LARYNGEAL PENETRATION: (Material entering into the laryngeal inlet/vestibule but not aspirated):  NONE E. ASPIRATION: NONE F. ESOPHAGEAL PHASE: (Screening of the upper esophagus): no dysmotility viewable in the Cervical Esophagus  ASSESSMENT: Pt appears to present w/ appropriate oropharyngeal phase swallow functioning w/ no physiological or oral motor weakness noted during the swallow; no oropharyngeal phase dysphagia noted. Pt is at reduced risk for aspiration from an oropharyngeal phase standpoint. During the pharyngeal phase, the pharyngeal swallow initiation was timely(for pt's age) at [facility] for trials w/ timely and appropriate airway closure during the swallow. No laryngeal penetration or aspiration occurred. Appropriate pharyngeal clearing of all bolus consistencies was noted - no pharyngeal residue remained. This indicates adequate laryngeal excursion and pharyngeal pressure during the swallow. During the oral phase, pt exhibited timely bolus management and A-P transfer w/ all boluses; oral clearing was appropriate w/ all consistencies. Pt required min increased mashing/gumming time w/ the trials of increased textured foods (softened solids) d/t his EDENTULOUS status. This appears to be similar to the c/o difficulty he has at home w/ solids and could be the issue of foods "getting stuck" (as he pointed to the sternal notch area of the Esophagus). Swallowing foods more solid and not well masticated could impact the motility of the Esophageal phase/clearing and lead to regurgitation or a "full" feeling thus impacting overall oral intake at meals.    PLAN/RECOMMENDATIONS:  A. Diet: Mech Soft diet d/t Edentulous status - moistened foods in smaller bites; Thin liquids.  Pills in Puree whole if easier for swallowing  B. Swallowing Precautions: general aspiration precautions; RELFUX precautions  C. Recommended consultation to: GI for further Esophageal dysmotility and management; Reflux management. Recommend a Dietitian consult for nutritional support as needed  D. Therapy recommendations:  n/a  E. Results and recommendations were discussed w/ pt and Daughter; video viewed and questions answered           Dysphagia, unspecified type  Dysphagia, oropharyngeal - Plan: DG OP Swallowing Func-Medicare/Speech Path, DG OP Swallowing Func-Medicare/Speech Path  G-Codes - June 07, 2017 1717    Functional Assessment Tool Used  clinical judgement    Functional Limitations  Swallowing    Swallow Current Status (Z6109)  At least 1 percent but less than 20 percent impaired, limited or restricted    Swallow Goal Status (U0454)  At least 1 percent but less than 20 percent impaired, limited or restricted    Swallow Discharge Status [phone])  At least 1 percent but less than 20 percent impaired, limited or restricted           Problem List Patient Active Problem List   Diagnosis Date Noted  . Nodule of upper lobe of right lung 05/06/2017  . Liver lesion 05/06/2017  . Hyperlipidemia, unspecified 05/02/2017  . Hypertension 05/02/2017  . Personal history of tobacco use, presenting hazards to health 04/07/2016     Orinda Kenner, [place], CCC-SLP Watson,Katherine 06-07-17, 5:18 PM  [place], [place], [address] Phone: [phone]   Fax:     Name: Ruben Diaz MRN: [id number] Date of Birth: [birth year]

## 2017-05-18 ENCOUNTER — Telehealth: Payer: Self-pay | Admitting: *Deleted

## 2017-05-18 NOTE — Telephone Encounter (Signed)
Pt scheduled for original biopsy on 2/26 but asked if biopsy could be moved out to the following week due to scheduling conflicts. Biopsy rescheduled to 3/6 at 11am with 10am arrival. Pt scheduled to follow up with Dr. Rogue Bussing for results on Monday 3/11 at 2:30pm.  Attempted to contact patient with new appts without success. Message left for patient to call back to review appts. Appts will be mailed to patient as well once confirmed. Awaiting callback.

## 2017-05-20 ENCOUNTER — Ambulatory Visit: Payer: Medicare Other | Admitting: Internal Medicine

## 2017-05-24 ENCOUNTER — Ambulatory Visit: Payer: Medicare Other

## 2017-05-24 ENCOUNTER — Ambulatory Visit: Admission: RE | Admit: 2017-05-24 | Payer: Medicare Other | Source: Ambulatory Visit

## 2017-05-25 DIAGNOSIS — C3411 Malignant neoplasm of upper lobe, right bronchus or lung: Secondary | ICD-10-CM | POA: Insufficient documentation

## 2017-05-25 NOTE — Assessment & Plan Note (Signed)
#  Right upper lobe lung nodule 19 mm lung nodule to be 21.  Likely stage I.  Highly suspicious for malignancy based upon significant SUV uptake on the PET scan.   #Status post evaluation with thoracic surgery-given the proximity to the right rib it is recommended patient get "neoadjuvant therapy" to facilitate the surgery.  Patient is a good surgical candidate.   #Would recommend biopsy of the lesion ASAP; and follow-up a few days later-to review the pathology/as this would be very important to recommend the type of systemic therapy.  #Liver lesion-status post MRI of the liver negative.  #Patient will follow-up with me few days after the results of the lung nodule biopsy to review the pathology; treatment plan. Discussed with lung navigator [place].  # 25 minutes face-to-face with the patient discussing the above plan of care; more than 50% of time spent on prognosis/ natural history; counseling and coordination.

## 2017-05-30 ENCOUNTER — Ambulatory Visit: Payer: Medicare Other | Admitting: Internal Medicine

## 2017-06-01 ENCOUNTER — Ambulatory Visit
Admission: RE | Admit: 2017-06-01 | Discharge: 2017-06-01 | Disposition: A | Discharge status: Home / Self Care | Payer: Medicare Other | Source: Ambulatory Visit | Attending: Interventional Radiology | Admitting: Interventional Radiology

## 2017-06-01 ENCOUNTER — Ambulatory Visit
Admission: RE | Admit: 2017-06-01 | Discharge: 2017-06-01 | Disposition: A | Discharge status: Home / Self Care | Payer: Medicare Other | Source: Ambulatory Visit | Attending: Internal Medicine | Admitting: Internal Medicine

## 2017-06-01 ENCOUNTER — Other Ambulatory Visit: Payer: Self-pay | Admitting: Student

## 2017-06-01 DIAGNOSIS — Z7982 Long term (current) use of aspirin: Secondary | ICD-10-CM | POA: Insufficient documentation

## 2017-06-01 DIAGNOSIS — R911 Solitary pulmonary nodule: Secondary | ICD-10-CM | POA: Insufficient documentation

## 2017-06-01 DIAGNOSIS — C3411 Malignant neoplasm of upper lobe, right bronchus or lung: Secondary | ICD-10-CM | POA: Diagnosis not present

## 2017-06-01 DIAGNOSIS — Z87891 Personal history of nicotine dependence: Secondary | ICD-10-CM | POA: Insufficient documentation

## 2017-06-01 DIAGNOSIS — I252 Old myocardial infarction: Secondary | ICD-10-CM | POA: Insufficient documentation

## 2017-06-01 DIAGNOSIS — E785 Hyperlipidemia, unspecified: Secondary | ICD-10-CM | POA: Insufficient documentation

## 2017-06-01 DIAGNOSIS — Z9889 Other specified postprocedural states: Secondary | ICD-10-CM | POA: Insufficient documentation

## 2017-06-01 DIAGNOSIS — Z79899 Other long term (current) drug therapy: Secondary | ICD-10-CM | POA: Insufficient documentation

## 2017-06-01 DIAGNOSIS — Z888 Allergy status to other drugs, medicaments and biological substances status: Secondary | ICD-10-CM | POA: Diagnosis not present

## 2017-06-01 DIAGNOSIS — I739 Peripheral vascular disease, unspecified: Secondary | ICD-10-CM | POA: Insufficient documentation

## 2017-06-01 DIAGNOSIS — J95811 Postprocedural pneumothorax: Secondary | ICD-10-CM

## 2017-06-01 DIAGNOSIS — I129 Hypertensive chronic kidney disease with stage 1 through stage 4 chronic kidney disease, or unspecified chronic kidney disease: Secondary | ICD-10-CM | POA: Insufficient documentation

## 2017-06-01 DIAGNOSIS — N189 Chronic kidney disease, unspecified: Secondary | ICD-10-CM | POA: Diagnosis not present

## 2017-06-01 DIAGNOSIS — K219 Gastro-esophageal reflux disease without esophagitis: Secondary | ICD-10-CM | POA: Insufficient documentation

## 2017-06-01 LAB — CBC
HEMATOCRIT: 45.4 % (ref 40.0–52.0)
HEMOGLOBIN: 14.9 g/dL (ref 13.0–18.0)
MCH: 27.1 pg (ref 26.0–34.0)
MCHC: 32.7 g/dL (ref 32.0–36.0)
MCV: 82.9 fL (ref 80.0–100.0)
Platelets: 245 10*3/uL (ref 150–440)
RBC: 5.48 MIL/uL (ref 4.40–5.90)
RDW: 14.9 % — AB (ref 11.5–14.5)
WBC: 5.9 10*3/uL (ref 3.8–10.6)

## 2017-06-01 LAB — APTT: aPTT: 32 seconds (ref 24–36)

## 2017-06-01 MED ORDER — LIDOCAINE HCL 2 % IJ SOLN
INTRAMUSCULAR | Status: AC | PRN
  Administered 2017-06-01: 7 mg via INTRADERMAL

## 2017-06-01 MED ORDER — SODIUM CHLORIDE 0.9 % IV SOLN
INTRAVENOUS | Status: DC
  Administered 2017-06-01: 11:00:00 via INTRAVENOUS

## 2017-06-01 MED ORDER — FENTANYL CITRATE (PF) 100 MCG/2ML IJ SOLN
INTRAMUSCULAR | Status: AC
  Filled 2017-06-01: qty 4

## 2017-06-01 MED ORDER — MIDAZOLAM HCL 5 MG/5ML IJ SOLN
INTRAMUSCULAR | Status: AC
  Filled 2017-06-01: qty 10

## 2017-06-01 MED ORDER — FENTANYL CITRATE (PF) 100 MCG/2ML IJ SOLN
INTRAMUSCULAR | Status: AC | PRN
  Administered 2017-06-01: 25 ug via INTRAVENOUS
  Administered 2017-06-01: 50 ug via INTRAVENOUS

## 2017-06-01 MED ORDER — MIDAZOLAM HCL 2 MG/2ML IJ SOLN
INTRAMUSCULAR | Status: AC | PRN
  Administered 2017-06-01: 2 mg via INTRAVENOUS
  Administered 2017-06-01: 1 mg via INTRAVENOUS
  Administered 2017-06-01: 0.5 mg via INTRAVENOUS

## 2017-06-01 NOTE — Discharge Instructions (Signed)
Moderate Conscious Sedation, Adult, Care After These instructions provide you with information about caring for yourself after your procedure. Your health care provider may also give you more specific instructions. Your treatment has been planned according to current medical practices, but problems sometimes occur. Call your health care provider if you have any problems or questions after your procedure. What can I expect after the procedure? After your procedure, it is common:  To feel sleepy for several hours.  To feel clumsy and have poor balance for several hours.  To have poor judgment for several hours.  To vomit if you eat too soon.  Follow these instructions at home: For at least 24 hours after the procedure:   Do not: ? Participate in activities where you could fall or become injured. ? Drive. ? Use heavy machinery. ? Drink alcohol. ? Take sleeping pills or medicines that cause drowsiness. ? Make important decisions or sign legal documents. ? Take care of children on your own.  Rest. Eating and drinking  Follow the diet recommended by your health care provider.  If you vomit: ? Drink water, juice, or soup when you can drink without vomiting. ? Make sure you have little or no nausea before eating solid foods. General instructions  Have a responsible adult stay with you until you are awake and alert.  Take over-the-counter and prescription medicines only as told by your health care provider.  If you smoke, do not smoke without supervision.  Keep all follow-up visits as told by your health care provider. This is important. Contact a health care provider if:  You keep feeling nauseous or you keep vomiting.  You feel light-headed.  You develop a rash.  You have a fever. Get help right away if:  You have trouble breathing. This information is not intended to replace advice given to you by your health care provider. Make sure you discuss any questions you have  with your health care provider. Document Released: 01/03/2013 Document Revised: 08/18/2015 Document Reviewed: 07/05/2015 Elsevier Interactive Patient Education  2018 [place] Biopsy, Care After This sheet gives you information about how to care for yourself after your procedure. Your health care provider may also give you more specific instructions depending on the type of biopsy you had. If you have problems or questions, contact your health care provider. What can I expect after the procedure? After the procedure, it is common to have:  A cough.  A sore throat.  Pain where a needle, bronchoscope, or incision was used to collect a biopsy sample (biopsy site).  Follow these instructions at home: Medicines  Take over-the-counter and prescription medicines only as told by your health care provider.  Do not drive for 24 hours if you were given a sedative.  Do not drink alcohol while taking pain medicine.  Do not drive or use heavy machinery while taking prescription pain medicine.  To prevent or treat constipation while you are taking prescription pain medicine, your health care provider may recommend that you: ? Drink enough fluid to keep your urine clear or pale yellow. ? Take over-the-counter or prescription medicines. ? Eat foods that are high in fiber, such as fresh fruits and vegetables, whole grains, and beans. ? Limit foods that are high in fat and processed sugars, such as fried and sweet foods. Activity  If you had an incision during your procedure, avoid activities that may pull the incision site open.  Return to your normal activities as told by your health  care provider. Ask your health care provider what activities are safe for you. If you had an open biopsy:   Follow instructions from your health care provider about how to take care of your incision. Make sure you: ? Wash your hands with soap and water before you change your bandage (dressing). If soap and  water are not available, use hand sanitizer. ? Change your dressing as told by your health care provider. ? Leave stitches (sutures), skin glue, or adhesive strips in place. These skin closures may need to stay in place for 2 weeks or longer. If adhesive strip edges start to loosen and curl up, you may trim the loose edges. Do not remove adhesive strips completely unless your health care provider tells you to do that.  Check your incision area every day for signs of infection. Check for: ? Redness, swelling, or pain. ? Fluid or blood. ? Warmth. ? Pus or a bad smell. General instructions  It is up to you to get the results of your procedure. Ask your health care provider, or the department that is doing the procedure, when your results will be ready. Contact a health care provider if:  You have a fever.  You have redness, swelling, or pain around your biopsy site.  You have fluid or blood coming from your biopsy site.  Your biopsy site feels warm to the touch.  You have pus or a bad smell coming from your biopsy site. Get help right away if:  You cough up blood.  You have trouble breathing.  You have chest pain. Summary  After the procedure, it is common to have a sore throat and a cough.  Return to your normal activities as told by your health care provider. Ask your health care provider what activities are safe for you.  Take over-the-counter and prescription medicines only as told by your health care provider.  Report any unusual symptoms to your health care provider. This information is not intended to replace advice given to you by your health care provider. Make sure you discuss any questions you have with your health care provider. Document Released: 04/13/2016 Document Revised: 04/13/2016 Document Reviewed: 04/13/2016 Elsevier Interactive Patient Education  [employer].

## 2017-06-01 NOTE — H&P (Signed)
Chief Complaint: Patient was seen in consultation today for right upper lobe lung nodule biopsy at the request of Brahmanday,Govinda R  Referring Physician(s): Brahmanday,Govinda R  Patient Status: [facility] - Out-pt  History of Present Illness: Ruben Diaz is a 72 y.o. male former smoker with detection of a suspicious anterior RUL lung nodule by screening CT that measures about 1.9 cm by CT and has an SUV of 21 by PET.  PET shows no lymph node metastases but showed a possible area of hypermetabolism in the central liver.  MRI shows no liver lesions/mets.  Now here for biopsy of the lung lesion, which is subpleural and just deep to the juncture of the distal first right rib and sternum/manubrium.  Patient is asymptomatic.  Past Medical History:  Diagnosis Date  . Chronic kidney disease   . Dysphagia   . GERD (gastroesophageal reflux disease)   . History of BPH   . Hyperlipidemia   . Hypertension   . Myocardial infarction ([place])   . Peripheral vascular disease [facility])     Past Surgical History:  Procedure Laterality Date  . APPENDECTOMY    . COLONOSCOPY WITH PROPOFOL N/A 09/16/2016   Procedure: COLONOSCOPY WITH PROPOFOL;  Surgeon: [name], MD;  Location: [facility] ENDOSCOPY;  Service: Endoscopy;  Laterality: N/A;  . ESOPHAGOGASTRODUODENOSCOPY (EGD) WITH PROPOFOL N/A 09/16/2016   Procedure: ESOPHAGOGASTRODUODENOSCOPY (EGD) WITH PROPOFOL;  Surgeon: [name], MD;  Location: [facility] ENDOSCOPY;  Service: Endoscopy;  Laterality: N/A;  . ESOPHAGOGASTRODUODENOSCOPY (EGD) WITH PROPOFOL N/A 12/16/2016   Procedure: ESOPHAGOGASTRODUODENOSCOPY (EGD) WITH PROPOFOL;  Surgeon: [name], MD;  Location: [facility] ENDOSCOPY;  Service: Endoscopy;  Laterality: N/A;  . STOMACH SURGERY      Allergies: Sucralfate  Medications: Prior to Admission medications   Medication Sig Start Date End Date Taking? Authorizing Provider  amLODipine (NORVASC) 10 MG tablet Take 10 mg by  mouth daily.   Yes [provider]  aspirin EC 81 MG tablet Take 81 mg by mouth daily.   Yes [provider]  atorvastatin (LIPITOR) 10 MG tablet Take 10 mg by mouth daily.   Yes [provider]  hydrochlorothiazide (HYDRODIURIL) 25 MG tablet Take 25 mg by mouth daily.   Yes [provider]  pantoprazole (PROTONIX) 20 MG tablet Take 20 mg by mouth daily.   Yes [provider]  sucralfate (CARAFATE) 1 g tablet Take by mouth. 12/16/16  Yes [provider]  pantoprazole (PROTONIX) 40 MG tablet  02/20/17   [provider]     History reviewed. No pertinent family history.  Social History   Socioeconomic History  . Marital status: Single    Spouse name: None  . Number of children: None  . Years of education: None  . Highest education level: None  Social Needs  . Financial resource strain: None  . Food insecurity - worry: None  . Food insecurity - inability: None  . Transportation needs - medical: None  . Transportation needs - non-medical: None  Occupational History  . None  Tobacco Use  . Smoking status: Former Smoker    Packs/day: 0.70    Years: 51.00    Pack years: 35.70    Last attempt to quit: 2013    Years since quitting: 6.1  . Smokeless tobacco: Never Used  Substance and Sexual Activity  . Alcohol use: No  . Drug use: No  . Sexual activity: None  Other Topics Concern  . None  Social History Narrative  .  None    ECOG Status: 0 - Asymptomatic  Review of Systems: A 12 point ROS discussed and pertinent positives are indicated in the HPI above.  All other systems are negative.  Review of Systems  Constitutional: Negative.   HENT: Negative.   Respiratory: Negative.   Cardiovascular: Negative.   Gastrointestinal: Negative.   Genitourinary: Negative.   Musculoskeletal: Negative.   Neurological: Negative.     Vital Signs: BP 136/73   Pulse 66   Temp 97.6 F (36.4 C) (Oral)   Resp 14   Ht 6'  (1.829 m)   Wt 206 lb (93.4 kg)   SpO2 95%   BMI 27.94 kg/m   Physical Exam  Constitutional: He is oriented to person, place, and time. He appears well-developed and well-nourished. No distress.  HENT:  Head: Normocephalic and atraumatic.  Neck: Neck supple. No JVD present. No tracheal deviation present.  Cardiovascular: Normal rate, regular rhythm and normal heart sounds. Exam reveals no gallop and no friction rub.  No murmur heard. Pulmonary/Chest: Effort normal and breath sounds normal. No stridor. No respiratory distress. He has no wheezes. He has no rales.  Abdominal: Soft. Bowel sounds are normal. He exhibits no distension and no mass. There is no tenderness. There is no rebound and no guarding.  Musculoskeletal: He exhibits no edema.  Lymphadenopathy:    He has no cervical adenopathy.  Neurological: He is alert and oriented to person, place, and time.  Skin: Skin is warm and dry. He is not diaphoretic.  Vitals reviewed.   Imaging: Dg Op Swallowing Func-medicare/speech Path  Result Date: 05/17/2017 CLINICAL DATA:  Dysphagia. EXAM: MODIFIED BARIUM SWALLOW TECHNIQUE: Different consistencies of barium were administered orally to the patient by the Speech Pathologist. Imaging of the pharynx was performed in the lateral projection. FLUOROSCOPY TIME:  Fluoroscopy Time:  1 minute Radiation Exposure Index (if provided by the fluoroscopic device): 2.8 mGy Number of Acquired Spot Images: 0 COMPARISON:  None. FINDINGS: Thin liquid- within normal limits Nectar thick liquid- within normal limits Pure- within normal limits Pure with cracker- within normal limits IMPRESSION: Normal exam. Please refer to the Speech Pathologists report for complete details and recommendations. Electronically Signed   By: Marcello Moores  Register   On: 05/17/2017 13:57   Mr Liver W Wo Contrast  Result Date: 05/12/2017 CLINICAL DATA:  Newly diagnosed right lung carcinoma. Hypermetabolic liver lesion on recent PET-CT.  EXAM: MRI ABDOMEN WITHOUT AND WITH CONTRAST TECHNIQUE: Multiplanar multisequence MR imaging of the abdomen was performed both before and after the administration of intravenous contrast. CONTRAST:  24mL MULTIHANCE GADOBENATE DIMEGLUMINE 529 MG/ML IV SOLN COMPARISON:  PET-CT on 05/04/2017 FINDINGS: Lower chest: No acute findings. Hepatobiliary: Multiple small cysts are seen in both the right and left hepatic lobes, however no masses are identified. A small gallbladder is seen which is filled with small gallstones. No evidence of acute cholecystitis or biliary ductal dilatation. Pancreas:  No mass or inflammatory changes. Spleen:  Within normal limits in size and appearance. Adrenals/Urinary Tract: No masses identified. No evidence of hydronephrosis. Stomach/Bowel: Visualized abdominal bowel unremarkable. Vascular/Lymphatic: No pathologically enlarged lymph nodes identified. No abdominal aortic aneurysm. Other:  None. Musculoskeletal:  No suspicious bone lesions identified. IMPRESSION: No evidence of hepatic or other abdominal metastatic disease. Cholelithiasis.  No radiographic evidence of cholecystitis. Electronically Signed   By: Earle Gell M.D.   On: 05/12/2017 10:46   Nm Pet Image Initial (pi) Skull Base To Thigh  Result Date: 05/04/2017 CLINICAL DATA:  Initial treatment  strategy for right upper lobe lung lesion. EXAM: NUCLEAR MEDICINE PET SKULL BASE TO THIGH TECHNIQUE: 12.5 mCi F-18 FDG was injected intravenously. Full-ring PET imaging was performed from the skull base to thigh after the radiotracer. CT data was obtained and used for attenuation correction and anatomic localization. FASTING BLOOD GLUCOSE:  Value: 86 mg/dl COMPARISON:  Chest CT 04/18/2017 and 04/06/2016 FINDINGS: NECK: No hypermetabolic lymph nodes in the neck. CHEST: The 19 mm right upper lobe lung lesion is hypermetabolic with SUV max of 51.0. No enlarged or hypermetabolic mediastinal or hilar lymph nodes. No other worrisome pulmonary  lesions. Stable advanced atherosclerotic calcifications involving the aorta and coronary arteries. ABDOMEN/PELVIS: Focal area of hypermetabolism is noted in the liver near the gallbladder fossa. This is just anterior and superior to a low-attenuation lesion which is a benign cyst. Other smaller cysts are noted in the liver. No other hypermetabolic lesions are identified. MRI may be helpful for further evaluation. No other significant findings in the abdomen. No adrenal gland metastasis. No abdominal/pelvic lymphadenopathy. Advanced atherosclerotic calcifications involving the aorta and branch vessels. Hypermetabolism is noted in both testicles which can be normal. Bilateral scrotal hydroceles are noted. SKELETON: No focal hypermetabolic activity to suggest skeletal metastasis. IMPRESSION: 1. Markedly hypermetabolic 19 mm right upper lobe pulmonary lesion consistent with primary lung neoplasm. No enlarged or hypermetabolic mediastinal/hilar lymph nodes. 2. Single (poorly visualized on CT) hypermetabolic hepatic lesion near the gallbladder fossa. This could be a metastatic focus. Gallbladder cancer would be another possibility. MRI abdomen without and with contrast may be helpful for further evaluation. 3. No other findings to suggest metastatic disease. Electronically Signed   By: Marijo Sanes M.D.   On: 05/04/2017 12:11    Labs:  CBC: Recent Labs    05/06/17 0950 06/01/17 1035  WBC 5.2 5.9  HGB 14.3 14.9  HCT 43.3 45.4  PLT 256 245    COAGS: Recent Labs    06/01/17 1033  APTT 32    BMP: Recent Labs    05/06/17 0950  NA 137  K 3.0*  CL 98*  CO2 30  GLUCOSE 113*  BUN 20  CALCIUM 9.4  CREATININE 1.16  GFRNONAA >60  GFRAA >60    LIVER FUNCTION TESTS: Recent Labs    05/06/17 0950  BILITOT 0.7  AST 29  ALT 17  ALKPHOS 56  PROT 8.3*  ALBUMIN 3.9    Assessment and Plan:  Discussed CT guided biopsy of RUL lung nodule with Ruben Diaz and his daughter. Risks and benefits  discussed with the patient including, but not limited to bleeding, hemoptysis, respiratory failure requiring intubation, infection, pneumothorax requiring chest tube placement, stroke from air embolism or even death. All of the patient's questions were answered, patient is agreeable to proceed. Consent signed and in chart.  Thank you for this interesting consult.  I greatly enjoyed meeting Ruben Diaz and look forward to participating in their care.  A copy of this report was sent to the requesting provider on this date.  Electronically Signed: Azzie Roup, MD 06/01/2017, 11:33 AM   I spent a total of 30 Minutes in face to face in clinical consultation, greater than 50% of which was counseling/coordinating care for right lung nodule biopsy.

## 2017-06-01 NOTE — Procedures (Signed)
Interventional Radiology Procedure Note  Procedure: CT guided biopsy of RUL lung nodule  Complications: Tiny anterior PTX  Estimated Blood Loss: < 10 mL  Findings: 2 cm anterior, subpleural RUL nodule sampled via 17 G needle with 18 G core device x 2 yielding 2 solid core samples.  Post biopsy CT shows tiny anterior PTX.  Biosentry device used.  Will follow with CXR during recovery.  Venetia Night. Kathlene Cote, M.D Pager:  [phone]

## 2017-06-03 ENCOUNTER — Telehealth: Payer: Self-pay | Admitting: Internal Medicine

## 2017-06-03 LAB — SURGICAL PATHOLOGY

## 2017-06-03 NOTE — Telephone Encounter (Signed)
Noted! Thank you

## 2017-06-03 NOTE — Telephone Encounter (Signed)
Spoke to Dr.Baker; positive for adeno ca; pt appt with me on march 12th

## 2017-06-06 ENCOUNTER — Ambulatory Visit: Payer: Medicare Other | Admitting: Internal Medicine

## 2017-06-07 ENCOUNTER — Inpatient Hospital Stay: Payer: Medicare Other | Attending: Internal Medicine | Admitting: Internal Medicine

## 2017-06-07 ENCOUNTER — Other Ambulatory Visit: Payer: Self-pay

## 2017-06-07 ENCOUNTER — Inpatient Hospital Stay: Payer: Medicare Other

## 2017-06-07 VITALS — BP 131/76 | HR 60 | Temp 97.6°F | Resp 20 | Ht 72.0 in | Wt 205.0 lb

## 2017-06-07 DIAGNOSIS — J029 Acute pharyngitis, unspecified: Secondary | ICD-10-CM | POA: Insufficient documentation

## 2017-06-07 DIAGNOSIS — Z7689 Persons encountering health services in other specified circumstances: Secondary | ICD-10-CM | POA: Diagnosis not present

## 2017-06-07 DIAGNOSIS — N189 Chronic kidney disease, unspecified: Secondary | ICD-10-CM | POA: Insufficient documentation

## 2017-06-07 DIAGNOSIS — E876 Hypokalemia: Secondary | ICD-10-CM | POA: Diagnosis not present

## 2017-06-07 DIAGNOSIS — I739 Peripheral vascular disease, unspecified: Secondary | ICD-10-CM | POA: Diagnosis not present

## 2017-06-07 DIAGNOSIS — K219 Gastro-esophageal reflux disease without esophagitis: Secondary | ICD-10-CM | POA: Diagnosis not present

## 2017-06-07 DIAGNOSIS — I129 Hypertensive chronic kidney disease with stage 1 through stage 4 chronic kidney disease, or unspecified chronic kidney disease: Secondary | ICD-10-CM | POA: Insufficient documentation

## 2017-06-07 DIAGNOSIS — Z79899 Other long term (current) drug therapy: Secondary | ICD-10-CM | POA: Diagnosis not present

## 2017-06-07 DIAGNOSIS — K769 Liver disease, unspecified: Secondary | ICD-10-CM | POA: Insufficient documentation

## 2017-06-07 DIAGNOSIS — Z5111 Encounter for antineoplastic chemotherapy: Secondary | ICD-10-CM | POA: Diagnosis not present

## 2017-06-07 DIAGNOSIS — R42 Dizziness and giddiness: Secondary | ICD-10-CM | POA: Insufficient documentation

## 2017-06-07 DIAGNOSIS — Z7982 Long term (current) use of aspirin: Secondary | ICD-10-CM | POA: Insufficient documentation

## 2017-06-07 DIAGNOSIS — R1310 Dysphagia, unspecified: Secondary | ICD-10-CM | POA: Insufficient documentation

## 2017-06-07 DIAGNOSIS — E785 Hyperlipidemia, unspecified: Secondary | ICD-10-CM | POA: Diagnosis not present

## 2017-06-07 DIAGNOSIS — I959 Hypotension, unspecified: Secondary | ICD-10-CM | POA: Insufficient documentation

## 2017-06-07 DIAGNOSIS — R0602 Shortness of breath: Secondary | ICD-10-CM | POA: Diagnosis not present

## 2017-06-07 DIAGNOSIS — C3411 Malignant neoplasm of upper lobe, right bronchus or lung: Secondary | ICD-10-CM | POA: Diagnosis not present

## 2017-06-07 DIAGNOSIS — N400 Enlarged prostate without lower urinary tract symptoms: Secondary | ICD-10-CM | POA: Diagnosis not present

## 2017-06-07 DIAGNOSIS — Z87891 Personal history of nicotine dependence: Secondary | ICD-10-CM | POA: Diagnosis not present

## 2017-06-07 DIAGNOSIS — I252 Old myocardial infarction: Secondary | ICD-10-CM | POA: Diagnosis not present

## 2017-06-07 MED ORDER — PROCHLORPERAZINE MALEATE 10 MG PO TABS: 10.0000 mg | ORAL_TABLET | Freq: Four times a day (QID) | ORAL | 1 refills | Status: DC | PRN

## 2017-06-07 MED ORDER — ONDANSETRON HCL 8 MG PO TABS: ORAL_TABLET | ORAL | 1 refills | Status: DC

## 2017-06-07 MED ORDER — FOLIC ACID 1 MG PO TABS: 1.0000 mg | ORAL_TABLET | Freq: Every day | ORAL | 1 refills | Status: DC

## 2017-06-07 MED ORDER — CYANOCOBALAMIN 1000 MCG/ML IJ SOLN
1000.0000 ug | Freq: Once | INTRAMUSCULAR | Status: AC
  Administered 2017-06-07: 1000 ug via INTRAMUSCULAR

## 2017-06-07 MED ORDER — DEXAMETHASONE 4 MG PO TABS: ORAL_TABLET | ORAL | 0 refills | Status: DC

## 2017-06-07 NOTE — Progress Notes (Signed)
Patient here for final pathology results. Newly dx lung cancer

## 2017-06-07 NOTE — Progress Notes (Signed)
START ON PATHWAY REGIMEN - Non-Small Cell Lung     A cycle is every 21 days:     Pemetrexed      Carboplatin   **Always confirm dose/schedule in your pharmacy ordering system**    Patient Characteristics: Stage IIB - III - Resected (Adjuvant), No Prior Chemotherapy, Nonsquamous Cell AJCC T Category: T3 Current Disease Status: No Distant Mets or Local Recurrence AJCC N Category: N0 AJCC M Category: M0 AJCC 8 Stage Grouping: IIB Histology: Nonsquamous Cell Intent of Therapy: Curative Intent, Discussed with Patient

## 2017-06-07 NOTE — Assessment & Plan Note (Addendum)
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    # given the proximity to the right rib it is recommended patient get "neoadjuvant therapy" to facilitate the surgery.  Patient is a good surgical candidate.   #Proceed with neoadjuvant chemotherapy with carboplatinum-Alimta every 3 weeks x 2-4 cycles.  Discussed the potential side effects including but not limited to-increasing fatigue, nausea vomiting, diarrhea,sores in the mouth, increase risk of infection and also neuropathy.   # Chemotherapy education; Hopefully the planned start chemotherapy next week. Antiemetics-Zofran and Compazine.  Recommend dexamethasone/Alimta. Will hold off port for now.   #Start chemotherapy 1 week Tuesday [place]; 1 week lab; follow-up with me on April 9 for cycle #2. I have sent message for Dr.Oaks with above plan.   Cc; Dr.Oaks/[place].

## 2017-06-07 NOTE — Progress Notes (Signed)
[facility] NOTE  Patient Care Team: Leonel Ramsay, MD as PCP - General (Infectious Diseases) Telford Nab, RN as Registered Nurse  CHIEF COMPLAINTS/PURPOSE OF CONSULTATION:     Oncology History   #  RUL [place] Ca-~ 19 mm in size  with SUV of 21 [LDCT];- ADENOCARCINOMA, ACINAR AND SOLID PATTERN. Stage I [T1No] vs stage II [T3N0]   # 2019 Feb Liver lesion/uptake on PET - incidental- MRI liver -NEG     Primary cancer of right upper lobe of lung (HCC)     HISTORY OF PRESENTING ILLNESS:  Ruben Diaz 72 y.o.  male history of smoking; recently diagnosed lung cancer is here to review the pathology/plans of care.  In the interim he has been evaluated by thoracic surgery-given the proximity of the right upper lobe lung nodule even there is a surgical candidate recommend neoadjuvant therapy  Patient has mild chronic shortness of breath.  No unusual cough or hemoptysis.  Denies any pain.  Denies any hemoptysis.  No headaches.  No chest pain.    ROS: A complete 10 point review of system is done which is negative except mentioned above in history of present illness  MEDICAL HISTORY:  Past Medical History:  Diagnosis Date  . Chronic kidney disease   . Dysphagia   . GERD (gastroesophageal reflux disease)   . History of BPH   . Hyperlipidemia   . Hypertension   . Myocardial infarction ([place])   . Peripheral vascular disease ([place])     SURGICAL HISTORY: Past Surgical History:  Procedure Laterality Date  . APPENDECTOMY    . COLONOSCOPY WITH PROPOFOL N/A 09/16/2016   Procedure: COLONOSCOPY WITH PROPOFOL;  Surgeon: [name], MD;  Location: [facility] ENDOSCOPY;  Service: Endoscopy;  Laterality: N/A;  . ESOPHAGOGASTRODUODENOSCOPY (EGD) WITH PROPOFOL N/A 09/16/2016   Procedure: ESOPHAGOGASTRODUODENOSCOPY (EGD) WITH PROPOFOL;  Surgeon: [name], MD;  Location: [facility] ENDOSCOPY;  Service: Endoscopy;  Laterality: N/A;  .  ESOPHAGOGASTRODUODENOSCOPY (EGD) WITH PROPOFOL N/A 12/16/2016   Procedure: ESOPHAGOGASTRODUODENOSCOPY (EGD) WITH PROPOFOL;  Surgeon: [name], MD;  Location: [facility] ENDOSCOPY;  Service: Endoscopy;  Laterality: N/A;  . STOMACH SURGERY      SOCIAL HISTORY: lives in [place]; friend- geraldine; has 1 daughter;  Smoker quit.  Used work hosiery; previous alcohol; currently none.  Social History   Socioeconomic History  . Marital status: Single    Spouse name: Not on file  . Number of children: Not on file  . Years of education: Not on file  . Highest education level: Not on file  Social Needs  . Financial resource strain: Not on file  . Food insecurity - worry: Not on file  . Food insecurity - inability: Not on file  . Transportation needs - medical: Not on file  . Transportation needs - non-medical: Not on file  Occupational History  . Not on file  Tobacco Use  . Smoking status: Former Smoker    Packs/day: 0.70    Years: 51.00    Pack years: 35.70    Last attempt to quit: 2013    Years since quitting: 6.1  . Smokeless tobacco: Never Used  Substance and Sexual Activity  . Alcohol use: No  . Drug use: No  . Sexual activity: Not on file  Other Topics Concern  . Not on file  Social History Narrative  . Not on file    FAMILY HISTORY: No history of cancer in the family. No family history  on file.  ALLERGIES:  is allergic to sucralfate.  MEDICATIONS:  Current Outpatient Medications  Medication Sig Dispense Refill  . amLODipine (NORVASC) 10 MG tablet Take 10 mg by mouth daily.    Marland Kitchen aspirin EC 81 MG tablet Take 81 mg by mouth daily.    Marland Kitchen atorvastatin (LIPITOR) 10 MG tablet Take 10 mg by mouth daily.    . hydrochlorothiazide (HYDRODIURIL) 25 MG tablet Take 25 mg by mouth daily.    . pantoprazole (PROTONIX) 40 MG tablet Take 1 tablet by mouth daily.    . sucralfate (CARAFATE) 1 g tablet Take by mouth.    . dexamethasone (DECADRON) 4 MG tablet Take one pill AM & PM  x 3 days; start the day prior to chemo. 30 tablet 0  . folic acid (FOLVITE) 1 MG tablet Take 1 tablet (1 mg total) by mouth daily. 90 tablet 1  . ondansetron (ZOFRAN) 8 MG tablet One pill every 8 hours as needed for nausea/vomitting. 40 tablet 1  . prochlorperazine (COMPAZINE) 10 MG tablet Take 1 tablet (10 mg total) by mouth every 6 (six) hours as needed for nausea or vomiting. 40 tablet 1   Current Facility-Administered Medications  Medication Dose Route Frequency Provider Last Rate Last Dose  . cyanocobalamin ((VITAMIN B-12)) injection 1,000 mcg  1,000 mcg Intramuscular Once Cammie Sickle, MD          .  PHYSICAL EXAMINATION: ECOG PERFORMANCE STATUS: 0 - Asymptomatic  Vitals:   06/07/17 0843  BP: 131/76  Pulse: 60  Resp: 20  Temp: 97.6 F (36.4 C)   Filed Weights   06/07/17 0843  Weight: 205 lb 0.4 oz (93 kg)    GENERAL: Well-nourished well-developed; Alert, no distress and comfortable.  He is accompanied by his daughter. EYES: no pallor or icterus OROPHARYNX: no thrush or ulceration; good dentition  NECK: supple, no masses felt LYMPH:  no palpable lymphadenopathy in the cervical, axillary or inguinal regions LUNGS: clear to auscultation and  No wheeze or crackles HEART/CVS: regular rate & rhythm and no murmurs; No lower extremity edema ABDOMEN: abdomen soft, non-tender and normal bowel sounds Musculoskeletal:no cyanosis of digits and no clubbing  PSYCH: alert & oriented x 3 with fluent speech NEURO: no focal motor/sensory deficits SKIN:  no rashes or significant lesions  LABORATORY DATA:  I have reviewed the data as listed Lab Results  Component Value Date   WBC 5.9 06/01/2017   HGB 14.9 06/01/2017   HCT 45.4 06/01/2017   MCV 82.9 06/01/2017   PLT 245 06/01/2017   Recent Labs    05/06/17 0950  NA 137  K 3.0*  CL 98*  CO2 30  GLUCOSE 113*  BUN 20  CREATININE 1.16  CALCIUM 9.4  GFRNONAA >60  GFRAA >60  PROT 8.3*  ALBUMIN 3.9  AST 29  ALT  17  ALKPHOS 56  BILITOT 0.7    RADIOGRAPHIC STUDIES: I have personally reviewed the radiological images as listed and agreed with the findings in the report. Dg Op Swallowing Func-medicare/speech Path  Result Date: 05/17/2017 CLINICAL DATA:  Dysphagia. EXAM: MODIFIED BARIUM SWALLOW TECHNIQUE: Different consistencies of barium were administered orally to the patient by the Speech Pathologist. Imaging of the pharynx was performed in the lateral projection. FLUOROSCOPY TIME:  Fluoroscopy Time:  1 minute Radiation Exposure Index (if provided by the fluoroscopic device): 2.8 mGy Number of Acquired Spot Images: 0 COMPARISON:  None. FINDINGS: Thin liquid- within normal limits Nectar thick liquid- within normal limits Pure-  within normal limits Pure with cracker- within normal limits IMPRESSION: Normal exam. Please refer to the Speech Pathologists report for complete details and recommendations. Electronically Signed   By: Marcello Moores  Register   On: 05/17/2017 13:57   Mr Liver W Wo Contrast  Result Date: 05/12/2017 CLINICAL DATA:  Newly diagnosed right lung carcinoma. Hypermetabolic liver lesion on recent PET-CT. EXAM: MRI ABDOMEN WITHOUT AND WITH CONTRAST TECHNIQUE: Multiplanar multisequence MR imaging of the abdomen was performed both before and after the administration of intravenous contrast. CONTRAST:  28mL MULTIHANCE GADOBENATE DIMEGLUMINE 529 MG/ML IV SOLN COMPARISON:  PET-CT on 05/04/2017 FINDINGS: Lower chest: No acute findings. Hepatobiliary: Multiple small cysts are seen in both the right and left hepatic lobes, however no masses are identified. A small gallbladder is seen which is filled with small gallstones. No evidence of acute cholecystitis or biliary ductal dilatation. Pancreas:  No mass or inflammatory changes. Spleen:  Within normal limits in size and appearance. Adrenals/Urinary Tract: No masses identified. No evidence of hydronephrosis. Stomach/Bowel: Visualized abdominal bowel unremarkable.  Vascular/Lymphatic: No pathologically enlarged lymph nodes identified. No abdominal aortic aneurysm. Other:  None. Musculoskeletal:  No suspicious bone lesions identified. IMPRESSION: No evidence of hepatic or other abdominal metastatic disease. Cholelithiasis.  No radiographic evidence of cholecystitis. Electronically Signed   By: Earle Gell M.D.   On: 05/12/2017 10:46   Ct Biopsy  Result Date: 06/01/2017 CLINICAL DATA:  Anterior right upper lobe lung mass requiring image guided biopsy. EXAM: CT GUIDED CORE BIOPSY OF LUNG MASS ANESTHESIA/SEDATION: 2.5 mg IV Versed; 75 mcg IV Fentanyl Total Moderate Sedation Time:  24 minutes. The patient's level of consciousness and physiologic status were continuously monitored during the procedure by Radiology nursing. PROCEDURE: The procedure risks, benefits, and alternatives were explained to the patient. Questions regarding the procedure were encouraged and answered. The patient understands and consents to the procedure. The right anterior chest wall was prepped with chlorhexidine in a sterile fashion, and a sterile drape was applied covering the operative field. A sterile gown and sterile gloves were used for the procedure. Local anesthesia was provided with 1% Lidocaine. CT was performed in a supine position to localize a right upper lobe lung mass. Under CT guidance, a 17 gauge trocar needle was advanced to the anterior margin of the mass. After confirming needle tip position, 2 separate 18 gauge core biopsy samples were obtained with an automated biopsy device. Core biopsy samples were submitted in formalin. The outer needle was removed as the Biosentry device was utilized in depositing a plug at the pleural entry site. Additional CT images were performed. COMPLICATIONS: Tiny right anterior pneumothorax. SIR level A: No therapy, no consequence. FINDINGS: Subpleural anterior right upper lobe lung mass immediately deep to the distal aspect of the first rib and juncture  of the first rib with the manubrium measures approximately 1.8 x 2.1 cm. Solid core biopsy samples were obtained. After the second core biopsy sample, a tiny anterior pneumothorax was present anterior to the nodule. The patient was asymptomatic. This small pneumothorax will be followed by a chest x-ray during recovery. IMPRESSION: CT-guided core biopsy of anterior right upper lobe lung mass measuring approximately 2.1 cm in greatest diameter by CT today. The procedure was complicated by a tiny anterior pneumothorax adjacent to the lung nodule. This will be followed by chest x-ray during recovery. Electronically Signed   By: Aletta Edouard M.D.   On: 06/01/2017 13:06   Dg Chest Port 1 View  Result Date: 06/01/2017 CLINICAL DATA:  Status post percutaneous biopsy of right upper lobe lung nodule. Immediate post biopsy CT images showed evidence of a small localized pneumothorax anterior to the nodule. Follow-up chest x-ray is obtained 2 hours after the biopsy procedure. EXAM: PORTABLE CHEST 1 VIEW COMPARISON:  None. FINDINGS: The heart size and mediastinal contours are within normal limits. Nodular density of the right upper lobe projects immediately inferior to the tip of the right first rib and corresponds to the right upper lobe nodule which was sampled earlier today. There is no evidence of pulmonary edema, consolidation, pneumothorax or pleural fluid. The visualized skeletal structures are unremarkable. IMPRESSION: No evidence of pneumothorax or other acute findings after biopsy of a right upper lobe lung nodule. Electronically Signed   By: Aletta Edouard M.D.   On: 06/01/2017 14:51    ASSESSMENT & PLAN:   Primary cancer of right upper lobe of lung ([place]) #Adenocarcinoma stage I [T1No] vs stage II [T3N0] ; right upper lobe lung nodule 19 mm lung nodule to be 21.    # given the proximity to the right rib it is recommended patient get "neoadjuvant therapy" to facilitate the surgery.  Patient is a good  surgical candidate.   #Proceed with neoadjuvant chemotherapy with carboplatinum-Alimta every 3 weeks x 2-4 cycles.  Discussed the potential side effects including but not limited to-increasing fatigue, nausea vomiting, diarrhea,sores in the mouth, increase risk of infection and also neuropathy.   # Chemotherapy education; Hopefully the planned start chemotherapy next week. Antiemetics-Zofran and Compazine.  Recommend dexamethasone/Alimta. Will hold off port for now.   #Start chemotherapy 1 week Tuesday [place]; 1 week lab; follow-up with me on April 9 for cycle #2. I have sent message for Dr.Oaks with above plan.   Cc; Dr.Oaks/[place].   All questions were answered. The patient knows to call the clinic with any problems, questions or concerns.   Cammie Sickle, MD 06/07/2017 9:50 AM

## 2017-06-09 NOTE — Patient Instructions (Signed)
Pemetrexed injection What is this medicine? PEMETREXED (PEM e TREX ed) is a chemotherapy drug used to treat lung cancers like non-small cell lung cancer and mesothelioma. It may also be used to treat other cancers. This medicine may be used for other purposes; ask your health care provider or pharmacist if you have questions. COMMON BRAND NAME(S): Alimta What should I tell my health care provider before I take this medicine? They need to know if you have any of these conditions: -infection (especially a virus infection such as chickenpox, cold sores, or herpes) -kidney disease -low blood counts, like low white cell, platelet, or red cell counts -lung or breathing disease, like asthma -radiation therapy -an unusual or allergic reaction to pemetrexed, other medicines, foods, dyes, or preservative -pregnant or trying to get pregnant -breast-feeding How should I use this medicine? This drug is given as an infusion into a vein. It is administered in a hospital or clinic by a specially trained health care professional. Talk to your pediatrician regarding the use of this medicine in children. Special care may be needed. Overdosage: If you think you have taken too much of this medicine contact a poison control center or emergency room at once. NOTE: This medicine is only for you. Do not share this medicine with others. What if I miss a dose? It is important not to miss your dose. Call your doctor or health care professional if you are unable to keep an appointment. What may interact with this medicine? This medicine may interact with the following medications: -Ibuprofen This list may not describe all possible interactions. Give your health care provider a list of all the medicines, herbs, non-prescription drugs, or dietary supplements you use. Also tell them if you smoke, drink alcohol, or use illegal drugs. Some items may interact with your medicine. What should I watch for while using this  medicine? Visit your doctor for checks on your progress. This drug may make you feel generally unwell. This is not uncommon, as chemotherapy can affect healthy cells as well as cancer cells. Report any side effects. Continue your course of treatment even though you feel ill unless your doctor tells you to stop. In some cases, you may be given additional medicines to help with side effects. Follow all directions for their use. Call your doctor or health care professional for advice if you get a fever, chills or sore throat, or other symptoms of a cold or flu. Do not treat yourself. This drug decreases your body's ability to fight infections. Try to avoid being around people who are sick. This medicine may increase your risk to bruise or bleed. Call your doctor or health care professional if you notice any unusual bleeding. Be careful brushing and flossing your teeth or using a toothpick because you may get an infection or bleed more easily. If you have any dental work done, tell your dentist you are receiving this medicine. Avoid taking products that contain aspirin, acetaminophen, ibuprofen, naproxen, or ketoprofen unless instructed by your doctor. These medicines may hide a fever. Call your doctor or health care professional if you get diarrhea or mouth sores. Do not treat yourself. To protect your kidneys, drink water or other fluids as directed while you are taking this medicine. Do not become pregnant while taking this medicine or for 6 months after stopping it. Women should inform their doctor if they wish to become pregnant or think they might be pregnant. Men should not father a child while taking this medicine and   for 3 months after stopping it. This may interfere with the ability to father a child. You should talk to your doctor or health care professional if you are concerned about your fertility. There is a potential for serious side effects to an unborn child. Talk to your health care  professional or pharmacist for more information. Do not breast-feed an infant while taking this medicine or for 1 week after stopping it. What side effects may I notice from receiving this medicine? Side effects that you should report to your doctor or health care professional as soon as possible: -allergic reactions like skin rash, itching or hives, swelling of the face, lips, or tongue -breathing problems -redness, blistering, peeling or loosening of the skin, including inside the mouth -signs and symptoms of bleeding such as bloody or black, tarry stools; red or dark-brown urine; spitting up blood or brown material that looks like coffee grounds; red spots on the skin; unusual bruising or bleeding from the eye, gums, or nose -signs and symptoms of infection like fever or chills; cough; sore throat; pain or trouble passing urine -signs and symptoms of kidney injury like trouble passing urine or change in the amount of urine -signs and symptoms of liver injury like dark yellow or brown urine; general ill feeling or flu-like symptoms; light-colored stools; loss of appetite; nausea; right upper belly pain; unusually weak or tired; yellowing of the eyes or skin Side effects that usually do not require medical attention (report to your doctor or health care professional if they continue or are bothersome): -constipation -dizziness -mouth sores -nausea, vomiting -pain, tingling, numbness in the hands or feet -unusually weak or tired This list may not describe all possible side effects. Call your doctor for medical advice about side effects. You may report side effects to FDA at 1-800-FDA-1088. Where should I keep my medicine? This drug is given in a hospital or clinic and will not be stored at home. NOTE: This sheet is a summary. It may not cover all possible information. If you have questions about this medicine, talk to your doctor, pharmacist, or health care provider.  2018 Elsevier/Gold  Standard (2016-01-13 18:51:46) Carboplatin injection What is this medicine? CARBOPLATIN (KAR boe pla tin) is a chemotherapy drug. It targets fast dividing cells, like cancer cells, and causes these cells to die. This medicine is used to treat ovarian cancer and many other cancers. This medicine may be used for other purposes; ask your health care provider or pharmacist if you have questions. COMMON BRAND NAME(S): Paraplatin What should I tell my health care provider before I take this medicine? They need to know if you have any of these conditions: -blood disorders -hearing problems -kidney disease -recent or ongoing radiation therapy -an unusual or allergic reaction to carboplatin, cisplatin, other chemotherapy, other medicines, foods, dyes, or preservatives -pregnant or trying to get pregnant -breast-feeding How should I use this medicine? This drug is usually given as an infusion into a vein. It is administered in a hospital or clinic by a specially trained health care professional. Talk to your pediatrician regarding the use of this medicine in children. Special care may be needed. Overdosage: If you think you have taken too much of this medicine contact a poison control center or emergency room at once. NOTE: This medicine is only for you. Do not share this medicine with others. What if I miss a dose? It is important not to miss a dose. Call your doctor or health care professional if you are   unable to keep an appointment. What may interact with this medicine? -medicines for seizures -medicines to increase blood counts like filgrastim, pegfilgrastim, sargramostim -some antibiotics like amikacin, gentamicin, neomycin, streptomycin, tobramycin -vaccines Talk to your doctor or health care professional before taking any of these medicines: -acetaminophen -aspirin -ibuprofen -ketoprofen -naproxen This list may not describe all possible interactions. Give your health care provider a  list of all the medicines, herbs, non-prescription drugs, or dietary supplements you use. Also tell them if you smoke, drink alcohol, or use illegal drugs. Some items may interact with your medicine. What should I watch for while using this medicine? Your condition will be monitored carefully while you are receiving this medicine. You will need important blood work done while you are taking this medicine. This drug may make you feel generally unwell. This is not uncommon, as chemotherapy can affect healthy cells as well as cancer cells. Report any side effects. Continue your course of treatment even though you feel ill unless your doctor tells you to stop. In some cases, you may be given additional medicines to help with side effects. Follow all directions for their use. Call your doctor or health care professional for advice if you get a fever, chills or sore throat, or other symptoms of a cold or flu. Do not treat yourself. This drug decreases your body's ability to fight infections. Try to avoid being around people who are sick. This medicine may increase your risk to bruise or bleed. Call your doctor or health care professional if you notice any unusual bleeding. Be careful brushing and flossing your teeth or using a toothpick because you may get an infection or bleed more easily. If you have any dental work done, tell your dentist you are receiving this medicine. Avoid taking products that contain aspirin, acetaminophen, ibuprofen, naproxen, or ketoprofen unless instructed by your doctor. These medicines may hide a fever. Do not become pregnant while taking this medicine. Women should inform their doctor if they wish to become pregnant or think they might be pregnant. There is a potential for serious side effects to an unborn child. Talk to your health care professional or pharmacist for more information. Do not breast-feed an infant while taking this medicine. What side effects may I notice from  receiving this medicine? Side effects that you should report to your doctor or health care professional as soon as possible: -allergic reactions like skin rash, itching or hives, swelling of the face, lips, or tongue -signs of infection - fever or chills, cough, sore throat, pain or difficulty passing urine -signs of decreased platelets or bleeding - bruising, pinpoint red spots on the skin, black, tarry stools, nosebleeds -signs of decreased red blood cells - unusually weak or tired, fainting spells, lightheadedness -breathing problems -changes in hearing -changes in vision -chest pain -high blood pressure -low blood counts - This drug may decrease the number of white blood cells, red blood cells and platelets. You may be at increased risk for infections and bleeding. -nausea and vomiting -pain, swelling, redness or irritation at the injection site -pain, tingling, numbness in the hands or feet -problems with balance, talking, walking -trouble passing urine or change in the amount of urine Side effects that usually do not require medical attention (report to your doctor or health care professional if they continue or are bothersome): -hair loss -loss of appetite -metallic taste in the mouth or changes in taste This list may not describe all possible side effects. Call your doctor for medical   advice about side effects. You may report side effects to FDA at 1-800-FDA-1088. Where should I keep my medicine? This drug is given in a hospital or clinic and will not be stored at home. NOTE: This sheet is a summary. It may not cover all possible information. If you have questions about this medicine, talk to your doctor, pharmacist, or health care provider.  2018 Elsevier/Gold Standard (2007-06-20 14:38:05)  

## 2017-06-10 ENCOUNTER — Inpatient Hospital Stay: Payer: Medicare Other

## 2017-06-10 ENCOUNTER — Ambulatory Visit (INDEPENDENT_AMBULATORY_CARE_PROVIDER_SITE_OTHER): Payer: Medicare Other | Admitting: Cardiothoracic Surgery

## 2017-06-10 ENCOUNTER — Encounter: Payer: Self-pay | Admitting: Cardiothoracic Surgery

## 2017-06-10 VITALS — BP 135/74 | HR 66 | Temp 98.0°F | Ht 72.0 in | Wt 207.0 lb

## 2017-06-10 DIAGNOSIS — R918 Other nonspecific abnormal finding of lung field: Secondary | ICD-10-CM

## 2017-06-10 NOTE — Patient Instructions (Signed)
We will contact you once you are done with your chemotherapy.

## 2017-06-10 NOTE — Progress Notes (Signed)
  Patient ID: Ruben Diaz, male   DOB: 23-Sep-1945, 72 y.o.   MRN: [id number]  HISTORY: He returns today in follow-up.  He did meet with Dr. Lynett Fish and Dr. Lynett Fish has agreed with preoperative chemotherapy.  The patient states that he is not short of breath.  He has no chest wall pain.   Vitals:   06/10/17 0818  BP: 135/74  Pulse: 66  Temp: 98 F (36.7 C)  SpO2: 96%     EXAM:    Resp: Lungs are clear bilaterally.  No respiratory distress, normal effort. Heart:  Regular without murmurs Abd:  Abdomen is soft, non distended and non tender. No masses are palpable.  There is no rebound and no guarding.  Neurological: Alert and oriented to person, place, and time. Coordination normal.  Skin: Skin is warm and dry. No rash noted. No diaphoretic. No erythema. No pallor.  Psychiatric: Normal mood and affect. Normal behavior. Judgment and thought content normal.    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  He understands and would like to proceed with preoperative chemotherapy.  He also understands that after the chemotherapy is completed a restaging CT and PET will be required.  He will have his chemotherapy classes today.  I told him that if he needed a port placed later on that we could always do this as well.   PLAN:   He will receive preoperative chemotherapy in preparation for possible right upper lobectomy in the future.    [name], MD

## 2017-06-14 ENCOUNTER — Ambulatory Visit: Payer: Medicare Other | Admitting: Internal Medicine

## 2017-06-14 ENCOUNTER — Telehealth: Payer: Self-pay | Admitting: *Deleted

## 2017-06-14 ENCOUNTER — Inpatient Hospital Stay: Payer: Medicare Other

## 2017-06-14 ENCOUNTER — Other Ambulatory Visit: Payer: Self-pay | Admitting: Internal Medicine

## 2017-06-14 ENCOUNTER — Encounter: Payer: Self-pay | Admitting: *Deleted

## 2017-06-14 VITALS — BP 128/71 | HR 59 | Temp 94.3°F | Resp 20

## 2017-06-14 DIAGNOSIS — C3411 Malignant neoplasm of upper lobe, right bronchus or lung: Secondary | ICD-10-CM | POA: Diagnosis not present

## 2017-06-14 LAB — COMPREHENSIVE METABOLIC PANEL
ALT: 14 U/L — ABNORMAL LOW (ref 17–63)
AST: 23 U/L (ref 15–41)
Albumin: 3.6 g/dL (ref 3.5–5.0)
Alkaline Phosphatase: 71 U/L (ref 38–126)
Anion gap: 10 (ref 5–15)
BILIRUBIN TOTAL: 0.7 mg/dL (ref 0.3–1.2)
BUN: 20 mg/dL (ref 6–20)
CHLORIDE: 97 mmol/L — AB (ref 101–111)
CO2: 29 mmol/L (ref 22–32)
Calcium: 9 mg/dL (ref 8.9–10.3)
Creatinine, Ser: 1.08 mg/dL (ref 0.61–1.24)
Glucose, Bld: 139 mg/dL — ABNORMAL HIGH (ref 65–99)
POTASSIUM: 3.1 mmol/L — AB (ref 3.5–5.1)
Sodium: 136 mmol/L (ref 135–145)
TOTAL PROTEIN: 7.9 g/dL (ref 6.5–8.1)

## 2017-06-14 LAB — CBC WITH DIFFERENTIAL/PLATELET
BASOS ABS: 0 10*3/uL (ref 0–0.1)
Basophils Relative: 1 %
EOS ABS: 0.2 10*3/uL (ref 0–0.7)
Eosinophils Relative: 4 %
HCT: 40.8 % (ref 40.0–52.0)
HEMOGLOBIN: 13.8 g/dL (ref 13.0–18.0)
LYMPHS ABS: 1.6 10*3/uL (ref 1.0–3.6)
LYMPHS PCT: 33 %
MCH: 28 pg (ref 26.0–34.0)
MCHC: 34 g/dL (ref 32.0–36.0)
MCV: 82.4 fL (ref 80.0–100.0)
Monocytes Absolute: 0.6 10*3/uL (ref 0.2–1.0)
Monocytes Relative: 13 %
NEUTROS PCT: 49 %
Neutro Abs: 2.4 10*3/uL (ref 1.4–6.5)
PLATELETS: 255 10*3/uL (ref 150–440)
RBC: 4.95 MIL/uL (ref 4.40–5.90)
RDW: 14.7 % — ABNORMAL HIGH (ref 11.5–14.5)
WBC: 4.8 10*3/uL (ref 3.8–10.6)

## 2017-06-14 MED ORDER — SODIUM CHLORIDE 0.9 % IV SOLN
Freq: Once | INTRAVENOUS | Status: AC
  Administered 2017-06-14: 10:00:00 via INTRAVENOUS
  Filled 2017-06-14: qty 10

## 2017-06-14 MED ORDER — SODIUM CHLORIDE 0.9 % IV SOLN
531.5000 mg | Freq: Once | INTRAVENOUS | Status: AC
  Administered 2017-06-14: 530 mg via INTRAVENOUS
  Filled 2017-06-14 (×3): qty 53

## 2017-06-14 MED ORDER — POTASSIUM CHLORIDE 20 MEQ/100ML IV SOLN
INTRAVENOUS | Status: AC
  Filled 2017-06-14: qty 100

## 2017-06-14 MED ORDER — SODIUM CHLORIDE 0.9 % IV SOLN
Freq: Once | INTRAVENOUS | Status: AC
  Administered 2017-06-14: 10:00:00 via INTRAVENOUS
  Filled 2017-06-14: qty 1000

## 2017-06-14 MED ORDER — DEXAMETHASONE SODIUM PHOSPHATE 100 MG/10ML IJ SOLN: 10.0000 mg | Freq: Once | INTRAMUSCULAR | Status: DC

## 2017-06-14 MED ORDER — DEXAMETHASONE SODIUM PHOSPHATE 10 MG/ML IJ SOLN
10.0000 mg | Freq: Once | INTRAMUSCULAR | Status: AC
  Administered 2017-06-14: 10 mg via INTRAVENOUS
  Filled 2017-06-14: qty 1

## 2017-06-14 MED ORDER — SODIUM CHLORIDE 0.9 % IV SOLN
1000.0000 mg | Freq: Once | INTRAVENOUS | Status: AC
  Administered 2017-06-14: 1000 mg via INTRAVENOUS
  Filled 2017-06-14 (×3): qty 40

## 2017-06-14 MED ORDER — PALONOSETRON HCL INJECTION 0.25 MG/5ML
0.2500 mg | Freq: Once | INTRAVENOUS | Status: AC
  Administered 2017-06-14: 0.25 mg via INTRAVENOUS
  Filled 2017-06-14: qty 5

## 2017-06-14 NOTE — Progress Notes (Signed)
Signed carbo Alimta; hypokalemia potassium 3.1 recommend potassium 20.

## 2017-06-14 NOTE — Patient Instructions (Addendum)
Carboplatin injection What is this medicine? CARBOPLATIN (KAR boe pla tin) is a chemotherapy drug. It targets fast dividing cells, like cancer cells, and causes these cells to die. This medicine is used to treat ovarian cancer and many other cancers. This medicine may be used for other purposes; ask your health care provider or pharmacist if you have questions. COMMON BRAND NAME(S): Paraplatin What should I tell my health care provider before I take this medicine? They need to know if you have any of these conditions: -blood disorders -hearing problems -kidney disease -recent or ongoing radiation therapy -an unusual or allergic reaction to carboplatin, cisplatin, other chemotherapy, other medicines, foods, dyes, or preservatives -pregnant or trying to get pregnant -breast-feeding How should I use this medicine? This drug is usually given as an infusion into a vein. It is administered in a hospital or clinic by a specially trained health care professional. Talk to your pediatrician regarding the use of this medicine in children. Special care may be needed. Overdosage: If you think you have taken too much of this medicine contact a poison control center or emergency room at once. NOTE: This medicine is only for you. Do not share this medicine with others. What if I miss a dose? It is important not to miss a dose. Call your doctor or health care professional if you are unable to keep an appointment. What may interact with this medicine? -medicines for seizures -medicines to increase blood counts like filgrastim, pegfilgrastim, sargramostim -some antibiotics like amikacin, gentamicin, neomycin, streptomycin, tobramycin -vaccines Talk to your doctor or health care professional before taking any of these medicines: -acetaminophen -aspirin -ibuprofen -ketoprofen -naproxen This list may not describe all possible interactions. Give your health care provider a list of all the medicines, herbs,  non-prescription drugs, or dietary supplements you use. Also tell them if you smoke, drink alcohol, or use illegal drugs. Some items may interact with your medicine. What should I watch for while using this medicine? Your condition will be monitored carefully while you are receiving this medicine. You will need important blood work done while you are taking this medicine. This drug may make you feel generally unwell. This is not uncommon, as chemotherapy can affect healthy cells as well as cancer cells. Report any side effects. Continue your course of treatment even though you feel ill unless your doctor tells you to stop. In some cases, you may be given additional medicines to help with side effects. Follow all directions for their use. Call your doctor or health care professional for advice if you get a fever, chills or sore throat, or other symptoms of a cold or flu. Do not treat yourself. This drug decreases your body's ability to fight infections. Try to avoid being around people who are sick. This medicine may increase your risk to bruise or bleed. Call your doctor or health care professional if you notice any unusual bleeding. Be careful brushing and flossing your teeth or using a toothpick because you may get an infection or bleed more easily. If you have any dental work done, tell your dentist you are receiving this medicine. Avoid taking products that contain aspirin, acetaminophen, ibuprofen, naproxen, or ketoprofen unless instructed by your doctor. These medicines may hide a fever. Do not become pregnant while taking this medicine. Women should inform their doctor if they wish to become pregnant or think they might be pregnant. There is a potential for serious side effects to an unborn child. Talk to your health care professional or  pharmacist for more information. Do not breast-feed an infant while taking this medicine. What side effects may I notice from receiving this medicine? Side effects  that you should report to your doctor or health care professional as soon as possible: -allergic reactions like skin rash, itching or hives, swelling of the face, lips, or tongue -signs of infection - fever or chills, cough, sore throat, pain or difficulty passing urine -signs of decreased platelets or bleeding - bruising, pinpoint red spots on the skin, black, tarry stools, nosebleeds -signs of decreased red blood cells - unusually weak or tired, fainting spells, lightheadedness -breathing problems -changes in hearing -changes in vision -chest pain -high blood pressure -low blood counts - This drug may decrease the number of white blood cells, red blood cells and platelets. You may be at increased risk for infections and bleeding. -nausea and vomiting -pain, swelling, redness or irritation at the injection site -pain, tingling, numbness in the hands or feet -problems with balance, talking, walking -trouble passing urine or change in the amount of urine Side effects that usually do not require medical attention (report to your doctor or health care professional if they continue or are bothersome): -hair loss -loss of appetite -metallic taste in the mouth or changes in taste This list may not describe all possible side effects. Call your doctor for medical advice about side effects. You may report side effects to FDA at 1-800-FDA-1088. Where should I keep my medicine? This drug is given in a hospital or clinic and will not be stored at home. NOTE: This sheet is a summary. It may not cover all possible information. If you have questions about this medicine, talk to your doctor, pharmacist, or health care provider.  2018 Elsevier/Gold Standard (2007-06-20 14:38:05) Pemetrexed injection What is this medicine? PEMETREXED (PEM e TREX ed) is a chemotherapy drug used to treat lung cancers like non-small cell lung cancer and mesothelioma. It may also be used to treat other cancers. This medicine  may be used for other purposes; ask your health care provider or pharmacist if you have questions. COMMON BRAND NAME(S): Alimta What should I tell my health care provider before I take this medicine? They need to know if you have any of these conditions: -infection (especially a virus infection such as chickenpox, cold sores, or herpes) -kidney disease -low blood counts, like low white cell, platelet, or red cell counts -lung or breathing disease, like asthma -radiation therapy -an unusual or allergic reaction to pemetrexed, other medicines, foods, dyes, or preservative -pregnant or trying to get pregnant -breast-feeding How should I use this medicine? This drug is given as an infusion into a vein. It is administered in a hospital or clinic by a specially trained health care professional. Talk to your pediatrician regarding the use of this medicine in children. Special care may be needed. Overdosage: If you think you have taken too much of this medicine contact a poison control center or emergency room at once. NOTE: This medicine is only for you. Do not share this medicine with others. What if I miss a dose? It is important not to miss your dose. Call your doctor or health care professional if you are unable to keep an appointment. What may interact with this medicine? This medicine may interact with the following medications: -Ibuprofen This list may not describe all possible interactions. Give your health care provider a list of all the medicines, herbs, non-prescription drugs, or dietary supplements you use. Also tell them if you smoke,  drink alcohol, or use illegal drugs. Some items may interact with your medicine. What should I watch for while using this medicine? Visit your doctor for checks on your progress. This drug may make you feel generally unwell. This is not uncommon, as chemotherapy can affect healthy cells as well as cancer cells. Report any side effects. Continue your course  of treatment even though you feel ill unless your doctor tells you to stop. In some cases, you may be given additional medicines to help with side effects. Follow all directions for their use. Call your doctor or health care professional for advice if you get a fever, chills or sore throat, or other symptoms of a cold or flu. Do not treat yourself. This drug decreases your body's ability to fight infections. Try to avoid being around people who are sick. This medicine may increase your risk to bruise or bleed. Call your doctor or health care professional if you notice any unusual bleeding. Be careful brushing and flossing your teeth or using a toothpick because you may get an infection or bleed more easily. If you have any dental work done, tell your dentist you are receiving this medicine. Avoid taking products that contain aspirin, acetaminophen, ibuprofen, naproxen, or ketoprofen unless instructed by your doctor. These medicines may hide a fever. Call your doctor or health care professional if you get diarrhea or mouth sores. Do not treat yourself. To protect your kidneys, drink water or other fluids as directed while you are taking this medicine. Do not become pregnant while taking this medicine or for 6 months after stopping it. Women should inform their doctor if they wish to become pregnant or think they might be pregnant. Men should not father a child while taking this medicine and for 3 months after stopping it. This may interfere with the ability to father a child. You should talk to your doctor or health care professional if you are concerned about your fertility. There is a potential for serious side effects to an unborn child. Talk to your health care professional or pharmacist for more information. Do not breast-feed an infant while taking this medicine or for 1 week after stopping it. What side effects may I notice from receiving this medicine? Side effects that you should report to your  doctor or health care professional as soon as possible: -allergic reactions like skin rash, itching or hives, swelling of the face, lips, or tongue -breathing problems -redness, blistering, peeling or loosening of the skin, including inside the mouth -signs and symptoms of bleeding such as bloody or black, tarry stools; red or dark-brown urine; spitting up blood or brown material that looks like coffee grounds; red spots on the skin; unusual bruising or bleeding from the eye, gums, or nose -signs and symptoms of infection like fever or chills; cough; sore throat; pain or trouble passing urine -signs and symptoms of kidney injury like trouble passing urine or change in the amount of urine -signs and symptoms of liver injury like dark yellow or brown urine; general ill feeling or flu-like symptoms; light-colored stools; loss of appetite; nausea; right upper belly pain; unusually weak or tired; yellowing of the eyes or skin Side effects that usually do not require medical attention (report to your doctor or health care professional if they continue or are bothersome): -constipation -dizziness -mouth sores -nausea, vomiting -pain, tingling, numbness in the hands or feet -unusually weak or tired This list may not describe all possible side effects. Call your doctor for medical  advice about side effects. You may report side effects to FDA at 1-800-FDA-1088. Where should I keep my medicine? This drug is given in a hospital or clinic and will not be stored at home. NOTE: This sheet is a summary. It may not cover all possible information. If you have questions about this medicine, talk to your doctor, pharmacist, or health care provider.  2018 Elsevier/Gold Standard (2016-01-13 18:51:46)  Hypokalemia Hypokalemia means that the amount of potassium in the blood is lower than normal.Potassium is a chemical that helps regulate the amount of fluid in the body (electrolyte). It also stimulates muscle  tightening (contraction) and helps nerves work properly.Normally, most of the body's potassium is inside of cells, and only a very small amount is in the blood. Because the amount in the blood is so small, minor changes to potassium levels in the blood can be life-threatening. What are the causes? This condition may be caused by:  Antibiotic medicine.  Diarrhea or vomiting. Taking too much of a medicine that helps you have a bowel movement (laxative) can cause diarrhea and lead to hypokalemia.  Chronic kidney disease (CKD).  Medicines that help the body get rid of excess fluid (diuretics).  Eating disorders, such as bulimia.  Low magnesium levels in the body.  Sweating a lot.  What are the signs or symptoms? Symptoms of this condition include:  Weakness.  Constipation.  Fatigue.  Muscle cramps.  Mental confusion.  Skipped heartbeats or irregular heartbeat (palpitations).  Tingling or numbness.  How is this diagnosed? This condition is diagnosed with a blood test. How is this treated? Hypokalemia can be treated by taking potassium supplements by mouth or adjusting the medicines that you take. Treatment may also include eating more foods that contain a lot of potassium. If your potassium level is very low, you may need to get potassium through an IV tube in one of your veins and be monitored in the hospital. Follow these instructions at home:  Take over-the-counter and prescription medicines only as told by your health care provider. This includes vitamins and supplements.  Eat a healthy diet. A healthy diet includes fresh fruits and vegetables, whole grains, healthy fats, and lean proteins.  If instructed, eat more foods that contain a lot of potassium, such as: ? Nuts, such as peanuts and pistachios. ? Seeds, such as sunflower seeds and pumpkin seeds. ? Peas, lentils, and lima beans. ? Whole grain and bran cereals and breads. ? Fresh fruits and vegetables, such as  apricots, avocado, bananas, cantaloupe, kiwi, oranges, tomatoes, asparagus, and potatoes. ? Orange juice. ? Tomato juice. ? Red meats. ? Yogurt.  Keep all follow-up visits as told by your health care provider. This is important. Contact a health care provider if:  You have weakness that gets worse.  You feel your heart pounding or racing.  You vomit.  You have diarrhea.  You have diabetes (diabetes mellitus) and you have trouble keeping your blood sugar (glucose) in your target range. Get help right away if:  You have chest pain.  You have shortness of breath.  You have vomiting or diarrhea that lasts for more than 2 days.  You faint. This information is not intended to replace advice given to you by your health care provider. Make sure you discuss any questions you have with your health care provider. Document Released: 03/15/2005 Document Revised: 11/01/2015 Document Reviewed: 11/01/2015 Elsevier Interactive Patient Education  2018 [employer].

## 2017-06-14 NOTE — Progress Notes (Signed)
Reviewed medications with patient and he reported that he did not start the decadron as instructed to start for yesterday.  He took a dose from his prescription this morning.  Patient also had labs drawn and potassium was 3.1.  Dr. Rogue Bussing gave orders to give potassium prior to chemotherapy.   Consent for carboplatin and alimta was obtained and noted in the chart.  Questions reviewed and answered.    Patient has been instructed to return tomorrow for injection and verbalized understanding.

## 2017-06-14 NOTE — Telephone Encounter (Signed)
Patient called to inquire why he has to take folic acid. I explained to him that the folic acid prevents harmful metabolic ide effects from his chemotherapy.  He gave verbal understanding.

## 2017-06-15 ENCOUNTER — Other Ambulatory Visit: Payer: Self-pay | Admitting: Internal Medicine

## 2017-06-15 ENCOUNTER — Inpatient Hospital Stay: Payer: Medicare Other

## 2017-06-15 VITALS — BP 136/70 | HR 81 | Temp 96.9°F | Resp 20

## 2017-06-15 DIAGNOSIS — C3411 Malignant neoplasm of upper lobe, right bronchus or lung: Secondary | ICD-10-CM

## 2017-06-15 MED ORDER — PEGFILGRASTIM-CBQV 6 MG/0.6ML ~~LOC~~ SOSY
6.0000 mg | PREFILLED_SYRINGE | Freq: Once | SUBCUTANEOUS | Status: AC
  Administered 2017-06-15: 6 mg via SUBCUTANEOUS
  Filled 2017-06-15: qty 0.6

## 2017-06-15 NOTE — Progress Notes (Signed)
Spoke to [place] in pharmacy will add [place].

## 2017-06-20 ENCOUNTER — Telehealth: Payer: Self-pay | Admitting: *Deleted

## 2017-06-20 NOTE — Telephone Encounter (Signed)
RN attempted for the 5'th time to call patient. Phone continues to remain busy. No other alt phone # available

## 2017-06-20 NOTE — Telephone Encounter (Signed)
-----   Message from Secundino Ginger sent at 06/17/2017  3:12 PM EDT ----- Regarding: medication Contact: [phone] He wants to know when he starts taking his dexamehtasone?

## 2017-06-20 NOTE — Telephone Encounter (Signed)
Call attempt x 3 today. Phone lines continues to be busy.

## 2017-06-21 ENCOUNTER — Encounter: Payer: Self-pay | Admitting: Internal Medicine

## 2017-06-21 ENCOUNTER — Inpatient Hospital Stay (HOSPITAL_BASED_OUTPATIENT_CLINIC_OR_DEPARTMENT_OTHER): Payer: Medicare Other | Admitting: Internal Medicine

## 2017-06-21 ENCOUNTER — Telehealth: Payer: Self-pay | Admitting: *Deleted

## 2017-06-21 ENCOUNTER — Inpatient Hospital Stay: Payer: Medicare Other

## 2017-06-21 ENCOUNTER — Other Ambulatory Visit: Payer: Self-pay

## 2017-06-21 VITALS — BP 99/62 | HR 87 | Temp 96.2°F | Resp 22

## 2017-06-21 VITALS — BP 158/68 | HR 76 | Resp 20

## 2017-06-21 DIAGNOSIS — J029 Acute pharyngitis, unspecified: Secondary | ICD-10-CM

## 2017-06-21 DIAGNOSIS — C3411 Malignant neoplasm of upper lobe, right bronchus or lung: Secondary | ICD-10-CM | POA: Diagnosis not present

## 2017-06-21 DIAGNOSIS — I959 Hypotension, unspecified: Secondary | ICD-10-CM | POA: Diagnosis not present

## 2017-06-21 DIAGNOSIS — E785 Hyperlipidemia, unspecified: Secondary | ICD-10-CM | POA: Diagnosis not present

## 2017-06-21 DIAGNOSIS — N189 Chronic kidney disease, unspecified: Secondary | ICD-10-CM

## 2017-06-21 DIAGNOSIS — I739 Peripheral vascular disease, unspecified: Secondary | ICD-10-CM

## 2017-06-21 DIAGNOSIS — E876 Hypokalemia: Secondary | ICD-10-CM

## 2017-06-21 DIAGNOSIS — N400 Enlarged prostate without lower urinary tract symptoms: Secondary | ICD-10-CM

## 2017-06-21 DIAGNOSIS — I252 Old myocardial infarction: Secondary | ICD-10-CM

## 2017-06-21 DIAGNOSIS — K219 Gastro-esophageal reflux disease without esophagitis: Secondary | ICD-10-CM | POA: Diagnosis not present

## 2017-06-21 DIAGNOSIS — Z87891 Personal history of nicotine dependence: Secondary | ICD-10-CM

## 2017-06-21 DIAGNOSIS — K769 Liver disease, unspecified: Secondary | ICD-10-CM | POA: Diagnosis not present

## 2017-06-21 DIAGNOSIS — R42 Dizziness and giddiness: Secondary | ICD-10-CM

## 2017-06-21 DIAGNOSIS — E860 Dehydration: Secondary | ICD-10-CM | POA: Insufficient documentation

## 2017-06-21 DIAGNOSIS — I129 Hypertensive chronic kidney disease with stage 1 through stage 4 chronic kidney disease, or unspecified chronic kidney disease: Secondary | ICD-10-CM | POA: Diagnosis not present

## 2017-06-21 DIAGNOSIS — Z79899 Other long term (current) drug therapy: Secondary | ICD-10-CM

## 2017-06-21 DIAGNOSIS — Z7982 Long term (current) use of aspirin: Secondary | ICD-10-CM

## 2017-06-21 LAB — CBC WITH DIFFERENTIAL/PLATELET
BASOS PCT: 0 %
Basophils Absolute: 0 10*3/uL (ref 0–0.1)
EOS ABS: 0.3 10*3/uL (ref 0–0.7)
Eosinophils Relative: 10 %
HEMATOCRIT: 41.2 % (ref 40.0–52.0)
Hemoglobin: 13.7 g/dL (ref 13.0–18.0)
Lymphocytes Relative: 44 %
Lymphs Abs: 1.2 10*3/uL (ref 1.0–3.6)
MCH: 27.7 pg (ref 26.0–34.0)
MCHC: 33.3 g/dL (ref 32.0–36.0)
MCV: 83.1 fL (ref 80.0–100.0)
MONO ABS: 0.4 10*3/uL (ref 0.2–1.0)
MONOS PCT: 13 %
NEUTROS ABS: 0.9 10*3/uL — AB (ref 1.4–6.5)
Neutrophils Relative %: 33 %
Platelets: 119 10*3/uL — ABNORMAL LOW (ref 150–440)
RBC: 4.96 MIL/uL (ref 4.40–5.90)
RDW: 14.3 % (ref 11.5–14.5)
WBC: 2.8 10*3/uL — ABNORMAL LOW (ref 3.8–10.6)

## 2017-06-21 LAB — COMPREHENSIVE METABOLIC PANEL
ALBUMIN: 3.3 g/dL — AB (ref 3.5–5.0)
ALK PHOS: 52 U/L (ref 38–126)
ALT: 12 U/L — ABNORMAL LOW (ref 17–63)
ANION GAP: 10 (ref 5–15)
AST: 19 U/L (ref 15–41)
BILIRUBIN TOTAL: 1.1 mg/dL (ref 0.3–1.2)
BUN: 29 mg/dL — ABNORMAL HIGH (ref 6–20)
CALCIUM: 8.7 mg/dL — AB (ref 8.9–10.3)
CO2: 31 mmol/L (ref 22–32)
Chloride: 93 mmol/L — ABNORMAL LOW (ref 101–111)
Creatinine, Ser: 1.34 mg/dL — ABNORMAL HIGH (ref 0.61–1.24)
GFR calc non Af Amer: 51 mL/min — ABNORMAL LOW (ref >60)
GFR, EST AFRICAN AMERICAN: 59 mL/min — AB (ref >60)
GLUCOSE: 129 mg/dL — AB (ref 65–99)
Potassium: 3 mmol/L — ABNORMAL LOW (ref 3.5–5.1)
Sodium: 134 mmol/L — ABNORMAL LOW (ref 135–145)
TOTAL PROTEIN: 8 g/dL (ref 6.5–8.1)

## 2017-06-21 MED ORDER — SODIUM CHLORIDE 0.9 % IV SOLN
Freq: Once | INTRAVENOUS | Status: AC
  Administered 2017-06-21: 09:00:00 via INTRAVENOUS
  Filled 2017-06-21: qty 1000

## 2017-06-21 MED ORDER — SODIUM CHLORIDE 0.9 % IV SOLN: Freq: Once | INTRAVENOUS | Status: DC

## 2017-06-21 MED ORDER — POTASSIUM CHLORIDE 20 MEQ/100ML IV SOLN
20.0000 meq | Freq: Once | INTRAVENOUS | Status: AC
  Administered 2017-06-21: 20 meq via INTRAVENOUS

## 2017-06-21 MED ORDER — MAGIC MOUTHWASH: 5.0000 mL | Freq: Four times a day (QID) | ORAL | 3 refills | Status: DC | PRN

## 2017-06-21 MED ORDER — ONDANSETRON HCL 4 MG/2ML IJ SOLN
8.0000 mg | Freq: Once | INTRAMUSCULAR | Status: AC
  Administered 2017-06-21: 4 mg via INTRAVENOUS

## 2017-06-21 MED ORDER — POTASSIUM CHLORIDE 20 MEQ/100ML IV SOLN
INTRAVENOUS | Status: AC
  Filled 2017-06-21: qty 100

## 2017-06-21 MED ORDER — ONDANSETRON HCL 4 MG/2ML IJ SOLN
INTRAMUSCULAR | Status: AC
  Filled 2017-06-21: qty 4

## 2017-06-21 NOTE — Progress Notes (Signed)
[place] NOTE  Patient Care Team: Leonel Ramsay, MD as PCP - General (Infectious Diseases) Telford Nab, RN as Registered Nurse  CHIEF COMPLAINTS/PURPOSE OF CONSULTATION:     Oncology History   #  RUL Adeno Ca-~ 19 mm in size  with SUV of 21 [LDCT];- ADENOCARCINOMA, ACINAR AND SOLID PATTERN. Stage I [T1No] vs stage II [T3N0]   # 2019 Feb Liver lesion/uptake on PET - incidental- MRI liver -NEG     Primary cancer of right upper lobe of lung (HCC)     HISTORY OF PRESENTING ILLNESS:  Ruben Diaz 72 y.o.  male  Stage II adenoca- on neo-adj carbo-alimta s/p cylce #1 appx 2 weeks ago.  Patient denies any nausea vomiting.  However complains of sore throat difficulty swallowing the last 2 days.  Complains of poor p.o. Intake.  Complains of dizziness especially when standing.  Denies any constipation or diarrhea.  Has chronic mild shortness of breath not any worse.  Denies any headaches or chest pain  ROS: A complete 10 point review of system is done which is negative except mentioned above in history of present illness  MEDICAL HISTORY:  Past Medical History:  Diagnosis Date  . Chronic kidney disease   . Dysphagia   . GERD (gastroesophageal reflux disease)   . History of BPH   . Hyperlipidemia   . Hypertension   . Myocardial infarction ([place])   . Peripheral vascular disease ([place])     SURGICAL HISTORY: Past Surgical History:  Procedure Laterality Date  . APPENDECTOMY    . COLONOSCOPY WITH PROPOFOL N/A 09/16/2016   Procedure: COLONOSCOPY WITH PROPOFOL;  Surgeon: [name], MD;  Location: [facility] ENDOSCOPY;  Service: Endoscopy;  Laterality: N/A;  . ESOPHAGOGASTRODUODENOSCOPY (EGD) WITH PROPOFOL N/A 09/16/2016   Procedure: ESOPHAGOGASTRODUODENOSCOPY (EGD) WITH PROPOFOL;  Surgeon: [name], MD;  Location: [facility] ENDOSCOPY;  Service: Endoscopy;  Laterality: N/A;  . ESOPHAGOGASTRODUODENOSCOPY (EGD) WITH PROPOFOL N/A 12/16/2016    Procedure: ESOPHAGOGASTRODUODENOSCOPY (EGD) WITH PROPOFOL;  Surgeon: [name], MD;  Location: [facility] ENDOSCOPY;  Service: Endoscopy;  Laterality: N/A;  . STOMACH SURGERY      SOCIAL HISTORY: lives in [place]; friend- geraldine; has 1 daughter;  Smoker quit.  Used work hosiery; previous alcohol; currently none.  Social History   Socioeconomic History  . Marital status: Single    Spouse name: Not on file  . Number of children: Not on file  . Years of education: Not on file  . Highest education level: Not on file  Occupational History  . Not on file  Social Needs  . Financial resource strain: Not on file  . Food insecurity:    Worry: Not on file    Inability: Not on file  . Transportation needs:    Medical: Not on file    Non-medical: Not on file  Tobacco Use  . Smoking status: Former Smoker    Packs/day: 0.70    Years: 51.00    Pack years: 35.70    Last attempt to quit: 2013    Years since quitting: 6.2  . Smokeless tobacco: Never Used  Substance and Sexual Activity  . Alcohol use: No  . Drug use: No  . Sexual activity: Not on file  Lifestyle  . Physical activity:    Days per week: Not on file    Minutes per session: Not on file  . Stress: Not on file  Relationships  . Social connections:    Talks  on phone: Not on file    Gets together: Not on file    Attends religious service: Not on file    Active member of club or organization: Not on file    Attends meetings of clubs or organizations: Not on file    Relationship status: Not on file  . Intimate partner violence:    Fear of current or ex partner: Not on file    Emotionally abused: Not on file    Physically abused: Not on file    Forced sexual activity: Not on file  Other Topics Concern  . Not on file  Social History Narrative  . Not on file    FAMILY HISTORY: No history of cancer in the family. History reviewed. No pertinent family history.  ALLERGIES:  is allergic to  sucralfate.  MEDICATIONS:  Current Outpatient Medications  Medication Sig Dispense Refill  . amLODipine (NORVASC) 10 MG tablet Take 10 mg by mouth daily.    Marland Kitchen aspirin EC 81 MG tablet Take 81 mg by mouth daily.    Marland Kitchen atorvastatin (LIPITOR) 10 MG tablet Take 10 mg by mouth daily.    . folic acid (FOLVITE) 1 MG tablet Take 1 tablet (1 mg total) by mouth daily. 90 tablet 1  . hydrochlorothiazide (HYDRODIURIL) 25 MG tablet Take 25 mg by mouth daily.    . pantoprazole (PROTONIX) 40 MG tablet Take 1 tablet by mouth daily.    . sucralfate (CARAFATE) 1 g tablet Take by mouth.    . dexamethasone (DECADRON) 4 MG tablet Take one pill AM & PM x 3 days; start the day prior to chemo. (Patient not taking: Reported on 06/21/2017) 30 tablet 0  . magic mouthwash SOLN Take 5 mLs by mouth 4 (four) times daily as needed for mouth pain. Swish & swallow 240 mL 3  . ondansetron (ZOFRAN) 8 MG tablet One pill every 8 hours as needed for nausea/vomitting. (Patient not taking: Reported on 06/21/2017) 40 tablet 1  . prochlorperazine (COMPAZINE) 10 MG tablet Take 1 tablet (10 mg total) by mouth every 6 (six) hours as needed for nausea or vomiting. (Patient not taking: Reported on 06/21/2017) 40 tablet 1   No current facility-administered medications for this visit.       Marland Kitchen  PHYSICAL EXAMINATION: ECOG PERFORMANCE STATUS: 0 - Asymptomatic  Vitals:   06/21/17 0836 06/21/17 0837  BP: (!) 84/55 99/62  Pulse: 92 87  Resp:    Temp:     There were no vitals filed for this visit.  GENERAL: Well-nourished well-developed; Alert, no distress and comfortable.  He is accompanied by his family.  EYES: no pallor or icterus OROPHARYNX: no thrush or ulceration; good dentition  NECK: supple, no masses felt LYMPH:  no palpable lymphadenopathy in the cervical, axillary or inguinal regions LUNGS: clear to auscultation and  No wheeze or crackles HEART/CVS: regular rate & rhythm and no murmurs; No lower extremity edema ABDOMEN:  abdomen soft, non-tender and normal bowel sounds Musculoskeletal:no cyanosis of digits and no clubbing  PSYCH: alert & oriented x 3 with fluent speech NEURO: no focal motor/sensory deficits SKIN:  no rashes or significant lesions  LABORATORY DATA:  I have reviewed the data as listed Lab Results  Component Value Date   WBC 2.8 (L) 06/21/2017   HGB 13.7 06/21/2017   HCT 41.2 06/21/2017   MCV 83.1 06/21/2017   PLT 119 (L) 06/21/2017   Recent Labs    05/06/17 0950 06/14/17 0943 06/21/17 0814  NA 137  136 134*  K 3.0* 3.1* 3.0*  CL 98* 97* 93*  CO2 30 29 31   GLUCOSE 113* 139* 129*  BUN 20 20 29*  CREATININE 1.16 1.08 1.34*  CALCIUM 9.4 9.0 8.7*  GFRNONAA >60 >60 51*  GFRAA >60 >60 59*  PROT 8.3* 7.9 8.0  ALBUMIN 3.9 3.6 3.3*  AST 29 23 19   ALT 17 14* 12*  ALKPHOS 56 71 52  BILITOT 0.7 0.7 1.1    RADIOGRAPHIC STUDIES: I have personally reviewed the radiological images as listed and agreed with the findings in the report. Ct Biopsy  Result Date: 06/01/2017 CLINICAL DATA:  Anterior right upper lobe lung mass requiring image guided biopsy. EXAM: CT GUIDED CORE BIOPSY OF LUNG MASS ANESTHESIA/SEDATION: 2.5 mg IV Versed; 75 mcg IV Fentanyl Total Moderate Sedation Time:  24 minutes. The patient's level of consciousness and physiologic status were continuously monitored during the procedure by Radiology nursing. PROCEDURE: The procedure risks, benefits, and alternatives were explained to the patient. Questions regarding the procedure were encouraged and answered. The patient understands and consents to the procedure. The right anterior chest wall was prepped with chlorhexidine in a sterile fashion, and a sterile drape was applied covering the operative field. A sterile gown and sterile gloves were used for the procedure. Local anesthesia was provided with 1% Lidocaine. CT was performed in a supine position to localize a right upper lobe lung mass. Under CT guidance, a 17 gauge trocar  needle was advanced to the anterior margin of the mass. After confirming needle tip position, 2 separate 18 gauge core biopsy samples were obtained with an automated biopsy device. Core biopsy samples were submitted in formalin. The outer needle was removed as the Biosentry device was utilized in depositing a plug at the pleural entry site. Additional CT images were performed. COMPLICATIONS: Tiny right anterior pneumothorax. SIR level A: No therapy, no consequence. FINDINGS: Subpleural anterior right upper lobe lung mass immediately deep to the distal aspect of the first rib and juncture of the first rib with the manubrium measures approximately 1.8 x 2.1 cm. Solid core biopsy samples were obtained. After the second core biopsy sample, a tiny anterior pneumothorax was present anterior to the nodule. The patient was asymptomatic. This small pneumothorax will be followed by a chest x-ray during recovery. IMPRESSION: CT-guided core biopsy of anterior right upper lobe lung mass measuring approximately 2.1 cm in greatest diameter by CT today. The procedure was complicated by a tiny anterior pneumothorax adjacent to the lung nodule. This will be followed by chest x-ray during recovery. Electronically Signed   By: Aletta Edouard M.D.   On: 06/01/2017 13:06   Dg Chest Port 1 View  Result Date: 06/01/2017 CLINICAL DATA:  Status post percutaneous biopsy of right upper lobe lung nodule. Immediate post biopsy CT images showed evidence of a small localized pneumothorax anterior to the nodule. Follow-up chest x-ray is obtained 2 hours after the biopsy procedure. EXAM: PORTABLE CHEST 1 VIEW COMPARISON:  None. FINDINGS: The heart size and mediastinal contours are within normal limits. Nodular density of the right upper lobe projects immediately inferior to the tip of the right first rib and corresponds to the right upper lobe nodule which was sampled earlier today. There is no evidence of pulmonary edema, consolidation,  pneumothorax or pleural fluid. The visualized skeletal structures are unremarkable. IMPRESSION: No evidence of pneumothorax or other acute findings after biopsy of a right upper lobe lung nodule. Electronically Signed   By: Jenness Corner.D.  On: 06/01/2017 14:51    ASSESSMENT & PLAN:   Primary cancer of right upper lobe of lung ([place]) #Adenocarcinoma stage I [T1No] vs stage II [T3N0] ; right upper lobe lung nodule 19 mm lung nodule to be 21.    Currently on  neoadjuvant chemotherapy with carboplatinum-Alimta  S/p cyle #1 appx 2 weeks ago  # sore throat- recommend magic mouth wash; continue folic acid.   # Dizzyness/ relative hypotension- creat 1.3 / slightly up. STOP hydrochlorthiazide/ norvasc; check Blood pressure at home daily- call us if pressure is greater than 150/ 90; call on 3/29- Friday with update. If improved wlll re-start norvasc.  # Hypokalemia- K- 3.0; recommend IV potassium today.   # keep appt as planned.    All questions were answered. The patient knows to call the clinic with any problems, questions or concerns.   Cammie Sickle, MD 06/21/2017 1:27 PM

## 2017-06-21 NOTE — Patient Instructions (Signed)
STOP hydrochlorthiazide/ norvasc; check Blood pressure at home daily- call us if pressure is greater than 150/ 90; call on 3/29- Friday with update.

## 2017-06-21 NOTE — Progress Notes (Signed)
Patient presents to clinic today with multiple concerns. Reports dizziness, unproductive cough and sore throat. Orthostatic vitals taken- pt is hypotensive.  84/52-sitting in chair 104/68-standing 91/58 - laying down 94/61-sitting on exam table 84/55-standing up 99/62-sitting down in chair  Patient reports that he only drinks 16 ounces of water a day.  Pt denies any episodes of N&V or Diarrhea.   Last bp- medications- norvasc taking at 646 am. Per patient's bp med diary.

## 2017-06-21 NOTE — Assessment & Plan Note (Addendum)
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    Currently on  neoadjuvant chemotherapy with carboplatinum-Alimta  S/p cyle #1 appx 2 weeks ago  # sore throat- recommend magic mouth wash; continue folic acid.   # Dizzyness/ relative hypotension- creat 1.3 / slightly up. STOP hydrochlorthiazide/ norvasc; check Blood pressure at home daily- call us if pressure is greater than 150/ 90; call on 3/29- Friday with update. If improved wlll re-start norvasc.  # Hypokalemia- K- 3.0; recommend IV potassium today.   # keep appt as planned.

## 2017-06-21 NOTE — Telephone Encounter (Signed)
Returned phone call to [phone].  Voice mail box is not set up.  Patient should contact his pcp for renewal of routine medications such as Lipitor per Dr. Rogue Bussing.

## 2017-06-21 NOTE — Telephone Encounter (Signed)
-----   Message from Secundino Ginger sent at 06/21/2017  4:23 PM EDT ----- Regarding: cholesterol med Contact: [phone] He called and left a VM and wanted to know did you sent cholesterol med to pharmacy. He is hard to understand.

## 2017-06-21 NOTE — Patient Instructions (Signed)
Dehydration, Adult Dehydration is when there is not enough fluid or water in your body. This happens when you lose more fluids than you take in. Dehydration can range from mild to very bad. It should be treated right away to keep it from getting very bad. Symptoms of mild dehydration may include:  Thirst.  Dry lips.  Slightly dry mouth.  Dry, warm skin.  Dizziness. Symptoms of moderate dehydration may include:  Very dry mouth.  Muscle cramps.  Dark pee (urine). Pee may be the color of tea.  Your body making less pee.  Your eyes making fewer tears.  Heartbeat that is uneven or faster than normal (palpitations).  Headache.  Light-headedness, especially when you stand up from sitting.  Fainting (syncope). Symptoms of very bad dehydration may include:  Changes in skin, such as: ? Cold and clammy skin. ? Blotchy (mottled) or pale skin. ? Skin that does not quickly return to normal after being lightly pinched and let go (poor skin turgor).  Changes in body fluids, such as: ? Feeling very thirsty. ? Your eyes making fewer tears. ? Not sweating when body temperature is high, such as in hot weather. ? Your body making very little pee.  Changes in vital signs, such as: ? Weak pulse. ? Pulse that is more than 100 beats a minute when you are sitting still. ? Fast breathing. ? Low blood pressure.  Other changes, such as: ? Sunken eyes. ? Cold hands and feet. ? Confusion. ? Lack of energy (lethargy). ? Trouble waking up from sleep. ? Short-term weight loss. ? Unconsciousness. Follow these instructions at home:  If told by your doctor, drink an ORS: ? Make an ORS by using instructions on the package. ? Start by drinking small amounts, about  cup (120 mL) every 5-10 minutes. ? Slowly drink more until you have had the amount that your doctor said to have.  Drink enough clear fluid to keep your pee clear or pale yellow. If you were told to drink an ORS, finish the ORS  first, then start slowly drinking clear fluids. Drink fluids such as: ? Water. Do not drink only water by itself. Doing that can make the salt (sodium) level in your body get too low (hyponatremia). ? Ice chips. ? Fruit juice that you have added water to (diluted). ? Low-calorie sports drinks.  Avoid: ? Alcohol. ? Drinks that have a lot of sugar. These include high-calorie sports drinks, fruit juice that does not have water added, and soda. ? Caffeine. ? Foods that are greasy or have a lot of fat or sugar.  Take over-the-counter and prescription medicines only as told by your doctor.  Do not take salt tablets. Doing that can make the salt level in your body get too high (hypernatremia).  Eat foods that have minerals (electrolytes). Examples include bananas, oranges, potatoes, tomatoes, and spinach.  Keep all follow-up visits as told by your doctor. This is important. Contact a doctor if:  You have belly (abdominal) pain that: ? Gets worse. ? Stays in one area (localizes).  You have a rash.  You have a stiff neck.  You get angry or annoyed more easily than normal (irritability).  You are more sleepy than normal.  You have a harder time waking up than normal.  You feel: ? Weak. ? Dizzy. ? Very thirsty.  You have peed (urinated) only a small amount of very dark pee during 6-8 hours. Get help right away if:  You have symptoms of   very bad dehydration.  You cannot drink fluids without throwing up (vomiting).  Your symptoms get worse with treatment.  You have a fever.  You have a very bad headache.  You are throwing up or having watery poop (diarrhea) and it: ? Gets worse. ? Does not go away.  You have blood or something green (bile) in your throw-up.  You have blood in your poop (stool). This may cause poop to look black and tarry.  You have not peed in 6-8 hours.  You pass out (faint).  Your heart rate when you are sitting still is more than 100 beats a  minute.  You have trouble breathing. This information is not intended to replace advice given to you by your health care provider. Make sure you discuss any questions you have with your health care provider. Document Released: 01/09/2009 Document Revised: 10/03/2015 Document Reviewed: 05/09/2015 Elsevier Interactive Patient Education  2018 [employer]  

## 2017-06-23 ENCOUNTER — Other Ambulatory Visit: Payer: Self-pay | Admitting: Internal Medicine

## 2017-06-23 DIAGNOSIS — R911 Solitary pulmonary nodule: Secondary | ICD-10-CM

## 2017-06-28 ENCOUNTER — Other Ambulatory Visit: Payer: Self-pay

## 2017-06-28 ENCOUNTER — Inpatient Hospital Stay: Payer: Medicare Other | Attending: Internal Medicine | Admitting: Internal Medicine

## 2017-06-28 ENCOUNTER — Inpatient Hospital Stay: Payer: Medicare Other

## 2017-06-28 VITALS — BP 142/71 | HR 77 | Temp 97.5°F | Resp 20 | Wt 202.4 lb

## 2017-06-28 DIAGNOSIS — R42 Dizziness and giddiness: Secondary | ICD-10-CM

## 2017-06-28 DIAGNOSIS — N189 Chronic kidney disease, unspecified: Secondary | ICD-10-CM | POA: Diagnosis not present

## 2017-06-28 DIAGNOSIS — I739 Peripheral vascular disease, unspecified: Secondary | ICD-10-CM | POA: Diagnosis not present

## 2017-06-28 DIAGNOSIS — N179 Acute kidney failure, unspecified: Secondary | ICD-10-CM | POA: Diagnosis not present

## 2017-06-28 DIAGNOSIS — Z5111 Encounter for antineoplastic chemotherapy: Secondary | ICD-10-CM | POA: Insufficient documentation

## 2017-06-28 DIAGNOSIS — Z7982 Long term (current) use of aspirin: Secondary | ICD-10-CM | POA: Diagnosis not present

## 2017-06-28 DIAGNOSIS — R630 Anorexia: Secondary | ICD-10-CM | POA: Diagnosis not present

## 2017-06-28 DIAGNOSIS — E785 Hyperlipidemia, unspecified: Secondary | ICD-10-CM | POA: Insufficient documentation

## 2017-06-28 DIAGNOSIS — R0602 Shortness of breath: Secondary | ICD-10-CM | POA: Insufficient documentation

## 2017-06-28 DIAGNOSIS — Z79899 Other long term (current) drug therapy: Secondary | ICD-10-CM | POA: Insufficient documentation

## 2017-06-28 DIAGNOSIS — R7989 Other specified abnormal findings of blood chemistry: Secondary | ICD-10-CM | POA: Diagnosis not present

## 2017-06-28 DIAGNOSIS — C3411 Malignant neoplasm of upper lobe, right bronchus or lung: Secondary | ICD-10-CM

## 2017-06-28 DIAGNOSIS — I129 Hypertensive chronic kidney disease with stage 1 through stage 4 chronic kidney disease, or unspecified chronic kidney disease: Secondary | ICD-10-CM | POA: Diagnosis not present

## 2017-06-28 DIAGNOSIS — N400 Enlarged prostate without lower urinary tract symptoms: Secondary | ICD-10-CM | POA: Diagnosis not present

## 2017-06-28 DIAGNOSIS — I252 Old myocardial infarction: Secondary | ICD-10-CM

## 2017-06-28 DIAGNOSIS — J302 Other seasonal allergic rhinitis: Secondary | ICD-10-CM | POA: Diagnosis not present

## 2017-06-28 DIAGNOSIS — K1231 Oral mucositis (ulcerative) due to antineoplastic therapy: Secondary | ICD-10-CM | POA: Insufficient documentation

## 2017-06-28 DIAGNOSIS — I959 Hypotension, unspecified: Secondary | ICD-10-CM | POA: Insufficient documentation

## 2017-06-28 DIAGNOSIS — K219 Gastro-esophageal reflux disease without esophagitis: Secondary | ICD-10-CM | POA: Insufficient documentation

## 2017-06-28 DIAGNOSIS — R911 Solitary pulmonary nodule: Secondary | ICD-10-CM

## 2017-06-28 DIAGNOSIS — Z87891 Personal history of nicotine dependence: Secondary | ICD-10-CM | POA: Diagnosis not present

## 2017-06-28 DIAGNOSIS — H5789 Other specified disorders of eye and adnexa: Secondary | ICD-10-CM | POA: Diagnosis not present

## 2017-06-28 LAB — COMPREHENSIVE METABOLIC PANEL
ALBUMIN: 3.5 g/dL (ref 3.5–5.0)
ALT: 14 U/L — ABNORMAL LOW (ref 17–63)
AST: 24 U/L (ref 15–41)
Alkaline Phosphatase: 75 U/L (ref 38–126)
Anion gap: 6 (ref 5–15)
BILIRUBIN TOTAL: 0.3 mg/dL (ref 0.3–1.2)
BUN: 19 mg/dL (ref 6–20)
CHLORIDE: 96 mmol/L — AB (ref 101–111)
CO2: 31 mmol/L (ref 22–32)
Calcium: 8.6 mg/dL — ABNORMAL LOW (ref 8.9–10.3)
Creatinine, Ser: 1.07 mg/dL (ref 0.61–1.24)
GFR calc Af Amer: >60 mL/min (ref >60)
GFR calc non Af Amer: >60 mL/min (ref >60)
GLUCOSE: 136 mg/dL — AB (ref 65–99)
POTASSIUM: 3.5 mmol/L (ref 3.5–5.1)
Sodium: 133 mmol/L — ABNORMAL LOW (ref 135–145)
Total Protein: 8.3 g/dL — ABNORMAL HIGH (ref 6.5–8.1)

## 2017-06-28 LAB — CBC WITH DIFFERENTIAL/PLATELET
Basophils Absolute: 0.1 10*3/uL (ref 0–0.1)
Basophils Relative: 0 %
EOS PCT: 2 %
Eosinophils Absolute: 0.2 10*3/uL (ref 0–0.7)
HEMATOCRIT: 40.1 % (ref 40.0–52.0)
HEMOGLOBIN: 13.3 g/dL (ref 13.0–18.0)
LYMPHS ABS: 1.6 10*3/uL (ref 1.0–3.6)
LYMPHS PCT: 14 %
MCH: 27.7 pg (ref 26.0–34.0)
MCHC: 33.3 g/dL (ref 32.0–36.0)
MCV: 83.1 fL (ref 80.0–100.0)
Monocytes Absolute: 0.6 10*3/uL (ref 0.2–1.0)
Monocytes Relative: 5 %
NEUTROS ABS: 9 10*3/uL — AB (ref 1.4–6.5)
Neutrophils Relative %: 79 %
PLATELETS: 258 10*3/uL (ref 150–440)
RBC: 4.83 MIL/uL (ref 4.40–5.90)
RDW: 14.6 % — ABNORMAL HIGH (ref 11.5–14.5)
WBC: 11.5 10*3/uL — AB (ref 3.8–10.6)

## 2017-06-28 NOTE — Progress Notes (Signed)
[facility] [facility] NOTE  Patient Care Team: Leonel Ramsay, MD as PCP - General (Infectious Diseases) Telford Nab, RN as Registered Nurse  CHIEF COMPLAINTS/PURPOSE OF CONSULTATION:     Oncology History   #  RUL [place] Ca-~ 19 mm in size  with SUV of 21 [LDCT];- ADENOCARCINOMA, ACINAR AND SOLID PATTERN. Stage I [T1No] vs stage II [T3N0]   # 2019 Feb Liver lesion/uptake on PET - incidental- MRI liver -NEG     Primary cancer of right upper lobe of lung (HCC)     HISTORY OF PRESENTING ILLNESS:  Ruben Diaz 72 y.o.  male  Stage II adenoca- on neo-adj carbo-alimta s/p cylce #1 appx 2 weeks ago.  Patient was evaluated in the clinic last week because of difficulty swallowing dehydration poor p.o. Intake.   Patient has been using [employer] Magic mouthwash; soreness in the throat improved.  His appetite is improving.  He has been drinking fluids at this time.  Dizziness resolved.  Denies any constipation or diarrhea.  Has chronic mild shortness of breath not any worse.  Denies any headaches or chest pain  ROS: A complete 10 point review of system is done which is negative except mentioned above in history of present illness  MEDICAL HISTORY:  Past Medical History:  Diagnosis Date  . Chronic kidney disease   . Dysphagia   . GERD (gastroesophageal reflux disease)   . History of BPH   . Hyperlipidemia   . Hypertension   . Myocardial infarction ([place])   . Peripheral vascular disease ([place])     SURGICAL HISTORY: Past Surgical History:  Procedure Laterality Date  . APPENDECTOMY    . COLONOSCOPY WITH PROPOFOL N/A 09/16/2016   Procedure: COLONOSCOPY WITH PROPOFOL;  Surgeon: [name], MD;  Location: [facility] ENDOSCOPY;  Service: Endoscopy;  Laterality: N/A;  . ESOPHAGOGASTRODUODENOSCOPY (EGD) WITH PROPOFOL N/A 09/16/2016   Procedure: ESOPHAGOGASTRODUODENOSCOPY (EGD) WITH PROPOFOL;  Surgeon: [name], MD;  Location: [facility] ENDOSCOPY;  Service:  Endoscopy;  Laterality: N/A;  . ESOPHAGOGASTRODUODENOSCOPY (EGD) WITH PROPOFOL N/A 12/16/2016   Procedure: ESOPHAGOGASTRODUODENOSCOPY (EGD) WITH PROPOFOL;  Surgeon: [name], MD;  Location: [facility] ENDOSCOPY;  Service: Endoscopy;  Laterality: N/A;  . STOMACH SURGERY      SOCIAL HISTORY: lives in [place]; friend- geraldine; has 1 daughter;  Smoker quit.  Used work hosiery; previous alcohol; currently none.  Social History   Socioeconomic History  . Marital status: Single    Spouse name: Not on file  . Number of children: Not on file  . Years of education: Not on file  . Highest education level: Not on file  Occupational History  . Not on file  Social Needs  . Financial resource strain: Not on file  . Food insecurity:    Worry: Not on file    Inability: Not on file  . Transportation needs:    Medical: Not on file    Non-medical: Not on file  Tobacco Use  . Smoking status: Former Smoker    Packs/day: 0.70    Years: 51.00    Pack years: 35.70    Last attempt to quit: 2013    Years since quitting: 6.2  . Smokeless tobacco: Never Used  Substance and Sexual Activity  . Alcohol use: No  . Drug use: No  . Sexual activity: Not on file  Lifestyle  . Physical activity:    Days per week: Not on file    Minutes per session: Not on  file  . Stress: Not on file  Relationships  . Social connections:    Talks on phone: Not on file    Gets together: Not on file    Attends religious service: Not on file    Active member of club or organization: Not on file    Attends meetings of clubs or organizations: Not on file    Relationship status: Not on file  . Intimate partner violence:    Fear of current or ex partner: Not on file    Emotionally abused: Not on file    Physically abused: Not on file    Forced sexual activity: Not on file  Other Topics Concern  . Not on file  Social History Narrative  . Not on file    FAMILY HISTORY: No history of cancer in the family. No  family history on file.  ALLERGIES:  is allergic to sucralfate.  MEDICATIONS:  Current Outpatient Medications  Medication Sig Dispense Refill  . amLODipine (NORVASC) 10 MG tablet Take 10 mg by mouth daily.    Marland Kitchen aspirin EC 81 MG tablet Take 81 mg by mouth daily.    Marland Kitchen atorvastatin (LIPITOR) 10 MG tablet Take 10 mg by mouth daily.    Marland Kitchen dexamethasone (DECADRON) 4 MG tablet Take one pill AM & PM x 3 days; start the day prior to chemo. 30 tablet 0  . folic acid (FOLVITE) 1 MG tablet Take 1 tablet (1 mg total) by mouth daily. 90 tablet 1  . hydrochlorothiazide (HYDRODIURIL) 25 MG tablet Take 25 mg by mouth daily.    . magic mouthwash SOLN Take 5 mLs by mouth 4 (four) times daily as needed for mouth pain. Swish & swallow 240 mL 3  . pantoprazole (PROTONIX) 40 MG tablet Take 1 tablet by mouth daily.    . sucralfate (CARAFATE) 1 g tablet Take by mouth.    . ondansetron (ZOFRAN) 8 MG tablet One pill every 8 hours as needed for nausea/vomitting. (Patient not taking: Reported on 06/21/2017) 40 tablet 1  . prochlorperazine (COMPAZINE) 10 MG tablet Take 1 tablet (10 mg total) by mouth every 6 (six) hours as needed for nausea or vomiting. (Patient not taking: Reported on 06/21/2017) 40 tablet 1   No current facility-administered medications for this visit.       Marland Kitchen  PHYSICAL EXAMINATION: ECOG PERFORMANCE STATUS: 0 - Asymptomatic  Vitals:   06/28/17 1041  BP: (!) 142/71  Pulse: 77  Resp: 20  Temp: (!) 97.5 F (36.4 C)   Filed Weights   06/28/17 1041  Weight: 202 lb 6.1 oz (91.8 kg)    GENERAL: Well-nourished well-developed; Alert, no distress and comfortable.  He is accompanied by his family.  EYES: no pallor or icterus OROPHARYNX: no thrush or ulceration; poor dentition  NECK: supple, no masses felt LYMPH:  no palpable lymphadenopathy in the cervical, axillary or inguinal regions LUNGS: clear to auscultation and  No wheeze or crackles HEART/CVS: regular rate & rhythm and no murmurs; No  lower extremity edema ABDOMEN: abdomen soft, non-tender and normal bowel sounds Musculoskeletal:no cyanosis of digits and no clubbing  PSYCH: alert & oriented x 3 with fluent speech NEURO: no focal motor/sensory deficits SKIN:  no rashes or significant lesions  LABORATORY DATA:  I have reviewed the data as listed Lab Results  Component Value Date   WBC 11.5 (H) 06/28/2017   HGB 13.3 06/28/2017   HCT 40.1 06/28/2017   MCV 83.1 06/28/2017   PLT 258 06/28/2017  Recent Labs    06/14/17 0943 06/21/17 0814 06/28/17 1008  NA 136 134* 133*  K 3.1* 3.0* 3.5  CL 97* 93* 96*  CO2 29 31 31   GLUCOSE 139* 129* 136*  BUN 20 29* 19  CREATININE 1.08 1.34* 1.07  CALCIUM 9.0 8.7* 8.6*  GFRNONAA >60 51* >60  GFRAA >60 59* >60  PROT 7.9 8.0 8.3*  ALBUMIN 3.6 3.3* 3.5  AST 23 19 24   ALT 14* 12* 14*  ALKPHOS 71 52 75  BILITOT 0.7 1.1 0.3    RADIOGRAPHIC STUDIES: I have personally reviewed the radiological images as listed and agreed with the findings in the report. Ct Biopsy  Result Date: 06/01/2017 CLINICAL DATA:  Anterior right upper lobe lung mass requiring image guided biopsy. EXAM: CT GUIDED CORE BIOPSY OF LUNG MASS ANESTHESIA/SEDATION: 2.5 mg IV Versed; 75 mcg IV Fentanyl Total Moderate Sedation Time:  24 minutes. The patient's level of consciousness and physiologic status were continuously monitored during the procedure by Radiology nursing. PROCEDURE: The procedure risks, benefits, and alternatives were explained to the patient. Questions regarding the procedure were encouraged and answered. The patient understands and consents to the procedure. The right anterior chest wall was prepped with chlorhexidine in a sterile fashion, and a sterile drape was applied covering the operative field. A sterile gown and sterile gloves were used for the procedure. Local anesthesia was provided with 1% Lidocaine. CT was performed in a supine position to localize a right upper lobe lung mass. Under CT  guidance, a 17 gauge trocar needle was advanced to the anterior margin of the mass. After confirming needle tip position, 2 separate 18 gauge core biopsy samples were obtained with an automated biopsy device. Core biopsy samples were submitted in formalin. The outer needle was removed as the Biosentry device was utilized in depositing a plug at the pleural entry site. Additional CT images were performed. COMPLICATIONS: Tiny right anterior pneumothorax. SIR level A: No therapy, no consequence. FINDINGS: Subpleural anterior right upper lobe lung mass immediately deep to the distal aspect of the first rib and juncture of the first rib with the manubrium measures approximately 1.8 x 2.1 cm. Solid core biopsy samples were obtained. After the second core biopsy sample, a tiny anterior pneumothorax was present anterior to the nodule. The patient was asymptomatic. This small pneumothorax will be followed by a chest x-ray during recovery. IMPRESSION: CT-guided core biopsy of anterior right upper lobe lung mass measuring approximately 2.1 cm in greatest diameter by CT today. The procedure was complicated by a tiny anterior pneumothorax adjacent to the lung nodule. This will be followed by chest x-ray during recovery. Electronically Signed   By: Aletta Edouard M.D.   On: 06/01/2017 13:06   Dg Chest Port 1 View  Result Date: 06/01/2017 CLINICAL DATA:  Status post percutaneous biopsy of right upper lobe lung nodule. Immediate post biopsy CT images showed evidence of a small localized pneumothorax anterior to the nodule. Follow-up chest x-ray is obtained 2 hours after the biopsy procedure. EXAM: PORTABLE CHEST 1 VIEW COMPARISON:  None. FINDINGS: The heart size and mediastinal contours are within normal limits. Nodular density of the right upper lobe projects immediately inferior to the tip of the right first rib and corresponds to the right upper lobe nodule which was sampled earlier today. There is no evidence of pulmonary  edema, consolidation, pneumothorax or pleural fluid. The visualized skeletal structures are unremarkable. IMPRESSION: No evidence of pneumothorax or other acute findings after biopsy of a  right upper lobe lung nodule. Electronically Signed   By: Aletta Edouard M.D.   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    Currently on  neoadjuvant chemotherapy with carboplatinum-Alimta  S/p cyle #1 appx 2 weeks ago; mild to moderate side effects from chemotherapy [see discussion below].  #Proceed with cycle #2 of neoadjuvant chemotherapy carbo Alimta in 1 week.   #Mucositis from chemotherapy s/p magic mouth wash; improved; continue folic acid/magic mouth wash.    # Dizzyness/ relative hypotension- creat 1.3; improved; Blood pressure- 140s; recommend re-stating norvasc/ stop HCTZ.  Written instructions given to the patient and family.  # follow up in 1 week/labs/carbo-alitma/ udenyca.  Also reminded taking dexamethasone prior to starting chemotherapy next week   All questions were answered. The patient knows to call the clinic with any problems, questions or concerns.   Cammie Sickle, MD 06/28/2017 12:30 PM

## 2017-06-28 NOTE — Progress Notes (Signed)
Here for follow up .stated " im feeling alright " per pt forgot to take dexamethasone yeaterday. Medication teaching done w various meds this am.

## 2017-06-28 NOTE — Patient Instructions (Signed)
#  Do not start hydrochlorothiazide.  #Start taking your Norvasc/amlodipine 10 mg

## 2017-06-28 NOTE — Assessment & Plan Note (Addendum)
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    Currently on  neoadjuvant chemotherapy with carboplatinum-Alimta  S/p cyle #1 appx 2 weeks ago; mild to moderate side effects from chemotherapy [see discussion below].  #Proceed with cycle #2 of neoadjuvant chemotherapy carbo Alimta in 1 week.   #Mucositis from chemotherapy s/p magic mouth wash; improved; continue folic acid/magic mouth wash.    # Dizzyness/ relative hypotension- creat 1.3; improved; Blood pressure- 140s; recommend re-stating norvasc/ stop HCTZ.  Written instructions given to the patient and family.  # follow up in 1 week/labs/carbo-alitma/ udenyca.  Also reminded taking dexamethasone prior to starting chemotherapy next week

## 2017-06-29 ENCOUNTER — Ambulatory Visit: Payer: Medicare Other

## 2017-07-05 ENCOUNTER — Inpatient Hospital Stay (HOSPITAL_BASED_OUTPATIENT_CLINIC_OR_DEPARTMENT_OTHER): Payer: Medicare Other | Admitting: Internal Medicine

## 2017-07-05 ENCOUNTER — Other Ambulatory Visit: Payer: Self-pay

## 2017-07-05 ENCOUNTER — Inpatient Hospital Stay: Payer: Medicare Other

## 2017-07-05 VITALS — BP 122/48 | HR 75 | Resp 20

## 2017-07-05 VITALS — BP 108/51 | HR 70 | Temp 98.6°F | Resp 20 | Ht 72.0 in | Wt 203.0 lb

## 2017-07-05 DIAGNOSIS — Z79899 Other long term (current) drug therapy: Secondary | ICD-10-CM

## 2017-07-05 DIAGNOSIS — I129 Hypertensive chronic kidney disease with stage 1 through stage 4 chronic kidney disease, or unspecified chronic kidney disease: Secondary | ICD-10-CM

## 2017-07-05 DIAGNOSIS — C3411 Malignant neoplasm of upper lobe, right bronchus or lung: Secondary | ICD-10-CM | POA: Diagnosis not present

## 2017-07-05 DIAGNOSIS — K219 Gastro-esophageal reflux disease without esophagitis: Secondary | ICD-10-CM

## 2017-07-05 DIAGNOSIS — R7989 Other specified abnormal findings of blood chemistry: Secondary | ICD-10-CM | POA: Diagnosis not present

## 2017-07-05 DIAGNOSIS — Z7982 Long term (current) use of aspirin: Secondary | ICD-10-CM | POA: Diagnosis not present

## 2017-07-05 DIAGNOSIS — N400 Enlarged prostate without lower urinary tract symptoms: Secondary | ICD-10-CM

## 2017-07-05 DIAGNOSIS — N189 Chronic kidney disease, unspecified: Secondary | ICD-10-CM

## 2017-07-05 DIAGNOSIS — I252 Old myocardial infarction: Secondary | ICD-10-CM

## 2017-07-05 DIAGNOSIS — E785 Hyperlipidemia, unspecified: Secondary | ICD-10-CM | POA: Diagnosis not present

## 2017-07-05 DIAGNOSIS — R630 Anorexia: Secondary | ICD-10-CM

## 2017-07-05 DIAGNOSIS — I739 Peripheral vascular disease, unspecified: Secondary | ICD-10-CM

## 2017-07-05 DIAGNOSIS — Z87891 Personal history of nicotine dependence: Secondary | ICD-10-CM

## 2017-07-05 DIAGNOSIS — K1231 Oral mucositis (ulcerative) due to antineoplastic therapy: Secondary | ICD-10-CM | POA: Diagnosis not present

## 2017-07-05 LAB — CBC WITH DIFFERENTIAL/PLATELET
BASOS ABS: 0.1 10*3/uL (ref 0–0.1)
BASOS PCT: 1 %
Eosinophils Absolute: 0 10*3/uL (ref 0–0.7)
Eosinophils Relative: 0 %
HEMATOCRIT: 37.7 % — AB (ref 40.0–52.0)
HEMOGLOBIN: 12.8 g/dL — AB (ref 13.0–18.0)
LYMPHS PCT: 10 %
Lymphs Abs: 1.4 10*3/uL (ref 1.0–3.6)
MCH: 27.6 pg (ref 26.0–34.0)
MCHC: 33.8 g/dL (ref 32.0–36.0)
MCV: 81.8 fL (ref 80.0–100.0)
MONO ABS: 1 10*3/uL (ref 0.2–1.0)
MONOS PCT: 7 %
NEUTROS ABS: 11.5 10*3/uL — AB (ref 1.4–6.5)
NEUTROS PCT: 82 %
Platelets: 564 10*3/uL — ABNORMAL HIGH (ref 150–440)
RBC: 4.61 MIL/uL (ref 4.40–5.90)
RDW: 14.9 % — AB (ref 11.5–14.5)
WBC: 14 10*3/uL — ABNORMAL HIGH (ref 3.8–10.6)

## 2017-07-05 LAB — COMPREHENSIVE METABOLIC PANEL
ALBUMIN: 3.3 g/dL — AB (ref 3.5–5.0)
ALT: 14 U/L — ABNORMAL LOW (ref 17–63)
ANION GAP: 11 (ref 5–15)
AST: 27 U/L (ref 15–41)
Alkaline Phosphatase: 65 U/L (ref 38–126)
BUN: 33 mg/dL — ABNORMAL HIGH (ref 6–20)
CALCIUM: 9.4 mg/dL (ref 8.9–10.3)
CO2: 27 mmol/L (ref 22–32)
Chloride: 99 mmol/L — ABNORMAL LOW (ref 101–111)
Creatinine, Ser: 1.4 mg/dL — ABNORMAL HIGH (ref 0.61–1.24)
GFR calc non Af Amer: 49 mL/min — ABNORMAL LOW (ref >60)
GFR, EST AFRICAN AMERICAN: 56 mL/min — AB (ref >60)
GLUCOSE: 162 mg/dL — AB (ref 65–99)
POTASSIUM: 3.4 mmol/L — AB (ref 3.5–5.1)
SODIUM: 137 mmol/L (ref 135–145)
TOTAL PROTEIN: 8.5 g/dL — AB (ref 6.5–8.1)
Total Bilirubin: 0.4 mg/dL (ref 0.3–1.2)

## 2017-07-05 MED ORDER — SODIUM CHLORIDE 0.9 % IV SOLN
Freq: Once | INTRAVENOUS | Status: AC
  Administered 2017-07-05: 10:00:00 via INTRAVENOUS
  Filled 2017-07-05 (×5): qty 1000

## 2017-07-05 MED ORDER — SODIUM CHLORIDE 0.9 % IV SOLN
1000.0000 mg | Freq: Once | INTRAVENOUS | Status: AC
  Administered 2017-07-05: 1000 mg via INTRAVENOUS
  Filled 2017-07-05 (×2): qty 40

## 2017-07-05 MED ORDER — DEXAMETHASONE SODIUM PHOSPHATE 10 MG/ML IJ SOLN
10.0000 mg | Freq: Once | INTRAMUSCULAR | Status: AC
  Administered 2017-07-05: 10 mg via INTRAVENOUS
  Filled 2017-07-05: qty 1

## 2017-07-05 MED ORDER — PALONOSETRON HCL INJECTION 0.25 MG/5ML
0.2500 mg | Freq: Once | INTRAVENOUS | Status: AC
  Administered 2017-07-05: 0.25 mg via INTRAVENOUS
  Filled 2017-07-05: qty 5

## 2017-07-05 MED ORDER — SODIUM CHLORIDE 0.9 % IV SOLN
438.5000 mg | Freq: Once | INTRAVENOUS | Status: AC
  Administered 2017-07-05: 440 mg via INTRAVENOUS
  Filled 2017-07-05: qty 44

## 2017-07-05 NOTE — Patient Instructions (Signed)
Carboplatin injection What is this medicine? CARBOPLATIN (KAR boe pla tin) is a chemotherapy drug. It targets fast dividing cells, like cancer cells, and causes these cells to die. This medicine is used to treat ovarian cancer and many other cancers. This medicine may be used for other purposes; ask your health care provider or pharmacist if you have questions. COMMON BRAND NAME(S): Paraplatin What should I tell my health care provider before I take this medicine? They need to know if you have any of these conditions: -blood disorders -hearing problems -kidney disease -recent or ongoing radiation therapy -an unusual or allergic reaction to carboplatin, cisplatin, other chemotherapy, other medicines, foods, dyes, or preservatives -pregnant or trying to get pregnant -breast-feeding How should I use this medicine? This drug is usually given as an infusion into a vein. It is administered in a hospital or clinic by a specially trained health care professional. Talk to your pediatrician regarding the use of this medicine in children. Special care may be needed. Overdosage: If you think you have taken too much of this medicine contact a poison control center or emergency room at once. NOTE: This medicine is only for you. Do not share this medicine with others. What if I miss a dose? It is important not to miss a dose. Call your doctor or health care professional if you are unable to keep an appointment. What may interact with this medicine? -medicines for seizures -medicines to increase blood counts like filgrastim, pegfilgrastim, sargramostim -some antibiotics like amikacin, gentamicin, neomycin, streptomycin, tobramycin -vaccines Talk to your doctor or health care professional before taking any of these medicines: -acetaminophen -aspirin -ibuprofen -ketoprofen -naproxen This list may not describe all possible interactions. Give your health care provider a list of all the medicines, herbs,  non-prescription drugs, or dietary supplements you use. Also tell them if you smoke, drink alcohol, or use illegal drugs. Some items may interact with your medicine. What should I watch for while using this medicine? Your condition will be monitored carefully while you are receiving this medicine. You will need important blood work done while you are taking this medicine. This drug may make you feel generally unwell. This is not uncommon, as chemotherapy can affect healthy cells as well as cancer cells. Report any side effects. Continue your course of treatment even though you feel ill unless your doctor tells you to stop. In some cases, you may be given additional medicines to help with side effects. Follow all directions for their use. Call your doctor or health care professional for advice if you get a fever, chills or sore throat, or other symptoms of a cold or flu. Do not treat yourself. This drug decreases your body's ability to fight infections. Try to avoid being around people who are sick. This medicine may increase your risk to bruise or bleed. Call your doctor or health care professional if you notice any unusual bleeding. Be careful brushing and flossing your teeth or using a toothpick because you may get an infection or bleed more easily. If you have any dental work done, tell your dentist you are receiving this medicine. Avoid taking products that contain aspirin, acetaminophen, ibuprofen, naproxen, or ketoprofen unless instructed by your doctor. These medicines may hide a fever. Do not become pregnant while taking this medicine. Women should inform their doctor if they wish to become pregnant or think they might be pregnant. There is a potential for serious side effects to an unborn child. Talk to your health care professional or  pharmacist for more information. Do not breast-feed an infant while taking this medicine. What side effects may I notice from receiving this medicine? Side effects  that you should report to your doctor or health care professional as soon as possible: -allergic reactions like skin rash, itching or hives, swelling of the face, lips, or tongue -signs of infection - fever or chills, cough, sore throat, pain or difficulty passing urine -signs of decreased platelets or bleeding - bruising, pinpoint red spots on the skin, black, tarry stools, nosebleeds -signs of decreased red blood cells - unusually weak or tired, fainting spells, lightheadedness -breathing problems -changes in hearing -changes in vision -chest pain -high blood pressure -low blood counts - This drug may decrease the number of white blood cells, red blood cells and platelets. You may be at increased risk for infections and bleeding. -nausea and vomiting -pain, swelling, redness or irritation at the injection site -pain, tingling, numbness in the hands or feet -problems with balance, talking, walking -trouble passing urine or change in the amount of urine Side effects that usually do not require medical attention (report to your doctor or health care professional if they continue or are bothersome): -hair loss -loss of appetite -metallic taste in the mouth or changes in taste This list may not describe all possible side effects. Call your doctor for medical advice about side effects. You may report side effects to FDA at 1-800-FDA-1088. Where should I keep my medicine? This drug is given in a hospital or clinic and will not be stored at home. NOTE: This sheet is a summary. It may not cover all possible information. If you have questions about this medicine, talk to your doctor, pharmacist, or health care provider.  2018 Elsevier/Gold Standard (2007-06-20 14:38:05) Pemetrexed injection What is this medicine? PEMETREXED (PEM e TREX ed) is a chemotherapy drug used to treat lung cancers like non-small cell lung cancer and mesothelioma. It may also be used to treat other cancers. This medicine  may be used for other purposes; ask your health care provider or pharmacist if you have questions. COMMON BRAND NAME(S): Alimta What should I tell my health care provider before I take this medicine? They need to know if you have any of these conditions: -infection (especially a virus infection such as chickenpox, cold sores, or herpes) -kidney disease -low blood counts, like low white cell, platelet, or red cell counts -lung or breathing disease, like asthma -radiation therapy -an unusual or allergic reaction to pemetrexed, other medicines, foods, dyes, or preservative -pregnant or trying to get pregnant -breast-feeding How should I use this medicine? This drug is given as an infusion into a vein. It is administered in a hospital or clinic by a specially trained health care professional. Talk to your pediatrician regarding the use of this medicine in children. Special care may be needed. Overdosage: If you think you have taken too much of this medicine contact a poison control center or emergency room at once. NOTE: This medicine is only for you. Do not share this medicine with others. What if I miss a dose? It is important not to miss your dose. Call your doctor or health care professional if you are unable to keep an appointment. What may interact with this medicine? This medicine may interact with the following medications: -Ibuprofen This list may not describe all possible interactions. Give your health care provider a list of all the medicines, herbs, non-prescription drugs, or dietary supplements you use. Also tell them if you smoke,  drink alcohol, or use illegal drugs. Some items may interact with your medicine. What should I watch for while using this medicine? Visit your doctor for checks on your progress. This drug may make you feel generally unwell. This is not uncommon, as chemotherapy can affect healthy cells as well as cancer cells. Report any side effects. Continue your course  of treatment even though you feel ill unless your doctor tells you to stop. In some cases, you may be given additional medicines to help with side effects. Follow all directions for their use. Call your doctor or health care professional for advice if you get a fever, chills or sore throat, or other symptoms of a cold or flu. Do not treat yourself. This drug decreases your body's ability to fight infections. Try to avoid being around people who are sick. This medicine may increase your risk to bruise or bleed. Call your doctor or health care professional if you notice any unusual bleeding. Be careful brushing and flossing your teeth or using a toothpick because you may get an infection or bleed more easily. If you have any dental work done, tell your dentist you are receiving this medicine. Avoid taking products that contain aspirin, acetaminophen, ibuprofen, naproxen, or ketoprofen unless instructed by your doctor. These medicines may hide a fever. Call your doctor or health care professional if you get diarrhea or mouth sores. Do not treat yourself. To protect your kidneys, drink water or other fluids as directed while you are taking this medicine. Do not become pregnant while taking this medicine or for 6 months after stopping it. Women should inform their doctor if they wish to become pregnant or think they might be pregnant. Men should not father a child while taking this medicine and for 3 months after stopping it. This may interfere with the ability to father a child. You should talk to your doctor or health care professional if you are concerned about your fertility. There is a potential for serious side effects to an unborn child. Talk to your health care professional or pharmacist for more information. Do not breast-feed an infant while taking this medicine or for 1 week after stopping it. What side effects may I notice from receiving this medicine? Side effects that you should report to your  doctor or health care professional as soon as possible: -allergic reactions like skin rash, itching or hives, swelling of the face, lips, or tongue -breathing problems -redness, blistering, peeling or loosening of the skin, including inside the mouth -signs and symptoms of bleeding such as bloody or black, tarry stools; red or dark-brown urine; spitting up blood or brown material that looks like coffee grounds; red spots on the skin; unusual bruising or bleeding from the eye, gums, or nose -signs and symptoms of infection like fever or chills; cough; sore throat; pain or trouble passing urine -signs and symptoms of kidney injury like trouble passing urine or change in the amount of urine -signs and symptoms of liver injury like dark yellow or brown urine; general ill feeling or flu-like symptoms; light-colored stools; loss of appetite; nausea; right upper belly pain; unusually weak or tired; yellowing of the eyes or skin Side effects that usually do not require medical attention (report to your doctor or health care professional if they continue or are bothersome): -constipation -dizziness -mouth sores -nausea, vomiting -pain, tingling, numbness in the hands or feet -unusually weak or tired This list may not describe all possible side effects. Call your doctor for medical  advice about side effects. You may report side effects to FDA at 1-800-FDA-1088. Where should I keep my medicine? This drug is given in a hospital or clinic and will not be stored at home. NOTE: This sheet is a summary. It may not cover all possible information. If you have questions about this medicine, talk to your doctor, pharmacist, or health care provider.  2018 Elsevier/Gold Standard (2016-01-13 18:51:46)

## 2017-07-05 NOTE — Progress Notes (Signed)
[place] NOTE  Patient Care Team: Leonel Ramsay, MD as PCP - General (Infectious Diseases) Telford Nab, RN as Registered Nurse  CHIEF COMPLAINTS/PURPOSE OF CONSULTATION:     Oncology History   #  RUL Adeno Ca-~ 19 mm in size  with SUV of 21 [LDCT];- ADENOCARCINOMA, ACINAR AND SOLID PATTERN. Stage I [T1No] vs stage II [T3N0]  # march 19th- CARBO-ALIMTA q 3 w  # 2019 Feb Liver lesion/uptake on PET - incidental- MRI liver -NEG     Primary cancer of right upper lobe of lung (HCC)     HISTORY OF PRESENTING ILLNESS:  Ruben Diaz 72 y.o.  male  Stage II adenoca- on neo-adj carbo-alimta s/p cylce #1 appx 3 weeks ago.  Patient was evaluated in the clinic last week because of difficulty swallowing dehydration poor p.o. Intake. Patient has been using [employer] Magic mouthwash; soreness in the throat improved.  His appetite is improving.  He has been drinking fluids at this time.   Denies any constipation or diarrhea.  Has chronic mild shortness of breath not any worse.  Denies any headaches or chest pain  ROS: A complete 10 point review of system is done which is negative except mentioned above in history of present illness  MEDICAL HISTORY:  Past Medical History:  Diagnosis Date  . Chronic kidney disease   . Dysphagia   . GERD (gastroesophageal reflux disease)   . History of BPH   . Hyperlipidemia   . Hypertension   . Myocardial infarction ([place])   . Peripheral vascular disease ([place])     SURGICAL HISTORY: Past Surgical History:  Procedure Laterality Date  . APPENDECTOMY    . COLONOSCOPY WITH PROPOFOL N/A 09/16/2016   Procedure: COLONOSCOPY WITH PROPOFOL;  Surgeon: [name], MD;  Location: [facility] ENDOSCOPY;  Service: Endoscopy;  Laterality: N/A;  . ESOPHAGOGASTRODUODENOSCOPY (EGD) WITH PROPOFOL N/A 09/16/2016   Procedure: ESOPHAGOGASTRODUODENOSCOPY (EGD) WITH PROPOFOL;  Surgeon: [name], MD;  Location: [facility] ENDOSCOPY;   Service: Endoscopy;  Laterality: N/A;  . ESOPHAGOGASTRODUODENOSCOPY (EGD) WITH PROPOFOL N/A 12/16/2016   Procedure: ESOPHAGOGASTRODUODENOSCOPY (EGD) WITH PROPOFOL;  Surgeon: [name], MD;  Location: [facility] ENDOSCOPY;  Service: Endoscopy;  Laterality: N/A;  . STOMACH SURGERY      SOCIAL HISTORY: [place]; friend- geraldine; has 1 daughter;  Smoker quit.  Used work hosiery; previous alcohol; currently none.  Social History   Socioeconomic History  . Marital status: Single    Spouse name: Not on file  . Number of children: Not on file  . Years of education: Not on file  . Highest education level: Not on file  Occupational History  . Not on file  Social Needs  . Financial resource strain: Not on file  . Food insecurity:    Worry: Not on file    Inability: Not on file  . Transportation needs:    Medical: Not on file    Non-medical: Not on file  Tobacco Use  . Smoking status: Former Smoker    Packs/day: 0.70    Years: 51.00    Pack years: 35.70    Last attempt to quit: 2013    Years since quitting: 6.2  . Smokeless tobacco: Never Used  Substance and Sexual Activity  . Alcohol use: No  . Drug use: No  . Sexual activity: Not on file  Lifestyle  . Physical activity:    Days per week: Not on file    Minutes per  session: Not on file  . Stress: Not on file  Relationships  . Social connections:    Talks on phone: Not on file    Gets together: Not on file    Attends religious service: Not on file    Active member of club or organization: Not on file    Attends meetings of clubs or organizations: Not on file    Relationship status: Not on file  . Intimate partner violence:    Fear of current or ex partner: Not on file    Emotionally abused: Not on file    Physically abused: Not on file    Forced sexual activity: Not on file  Other Topics Concern  . Not on file  Social History Narrative  . Not on file    FAMILY HISTORY: No history of cancer in the  family. No family history on file.  ALLERGIES:  is allergic to sucralfate.  MEDICATIONS:  Current Outpatient Medications  Medication Sig Dispense Refill  . amLODipine (NORVASC) 10 MG tablet Take 10 mg by mouth daily.    Marland Kitchen aspirin EC 81 MG tablet Take 81 mg by mouth daily.    Marland Kitchen atorvastatin (LIPITOR) 10 MG tablet Take 10 mg by mouth daily.    Marland Kitchen dexamethasone (DECADRON) 4 MG tablet Take one pill AM & PM x 3 days; start the day prior to chemo. 30 tablet 0  . folic acid (FOLVITE) 1 MG tablet Take 1 tablet (1 mg total) by mouth daily. 90 tablet 1  . hydrochlorothiazide (HYDRODIURIL) 25 MG tablet Take 25 mg by mouth daily.    . magic mouthwash SOLN Take 5 mLs by mouth 4 (four) times daily as needed for mouth pain. Swish & swallow 240 mL 3  . ondansetron (ZOFRAN) 8 MG tablet One pill every 8 hours as needed for nausea/vomitting. 40 tablet 1  . pantoprazole (PROTONIX) 40 MG tablet Take 1 tablet by mouth daily.    . prochlorperazine (COMPAZINE) 10 MG tablet Take 1 tablet (10 mg total) by mouth every 6 (six) hours as needed for nausea or vomiting. 40 tablet 1  . sucralfate (CARAFATE) 1 g tablet Take by mouth.     No current facility-administered medications for this visit.       Marland Kitchen  PHYSICAL EXAMINATION: ECOG PERFORMANCE STATUS: 0 - Asymptomatic  Vitals:   07/05/17 0915  BP: (!) 108/51  Pulse: 70  Resp: 20  Temp: 98.6 F (37 C)   Filed Weights   07/05/17 0910  Weight: 203 lb (92.1 kg)    GENERAL: Well-nourished well-developed; Alert, no distress and comfortable.  He is accompanied by his family.  EYES: no pallor or icterus OROPHARYNX: no thrush or ulceration; poor dentition  NECK: supple, no masses felt LYMPH:  no palpable lymphadenopathy in the cervical, axillary or inguinal regions LUNGS: clear to auscultation and  No wheeze or crackles HEART/CVS: regular rate & rhythm and no murmurs; No lower extremity edema ABDOMEN: abdomen soft, non-tender and normal bowel  sounds Musculoskeletal:no cyanosis of digits and no clubbing  PSYCH: alert & oriented x 3 with fluent speech NEURO: no focal motor/sensory deficits SKIN:  no rashes or significant lesions  LABORATORY DATA:  I have reviewed the data as listed Lab Results  Component Value Date   WBC 14.0 (H) 07/05/2017   HGB 12.8 (L) 07/05/2017   HCT 37.7 (L) 07/05/2017   MCV 81.8 07/05/2017   PLT 564 (H) 07/05/2017   Recent Labs    06/21/17 0814 06/28/17  1008 07/05/17 0828  NA 134* 133* 137  K 3.0* 3.5 3.4*  CL 93* 96* 99*  CO2 31 31 27   GLUCOSE 129* 136* 162*  BUN 29* 19 33*  CREATININE 1.34* 1.07 1.40*  CALCIUM 8.7* 8.6* 9.4  GFRNONAA 51* >60 49*  GFRAA 59* >60 56*  PROT 8.0 8.3* 8.5*  ALBUMIN 3.3* 3.5 3.3*  AST 19 24 27   ALT 12* 14* 14*  ALKPHOS 52 75 65  BILITOT 1.1 0.3 0.4    RADIOGRAPHIC STUDIES: I have personally reviewed the radiological images as listed and agreed with the findings in the report. No results found.  ASSESSMENT & PLAN:   Primary cancer of right upper lobe of lung ([place]) #Adenocarcinoma stage I [T1No] vs stage II [T3N0] ; right upper lobe lung nodule 19 mm lung nodule to be 21.    Currently on  neoadjuvant chemotherapy with carboplatinum-Alimta  S/p cyle #1 appx 3 weeks ago; tolerated with moderate side effects from chemotherapy [see discussion below].  #Proceed with cycle #2 of neoadjuvant chemotherapy carbo Alimta today.  Labs today reviewed;  acceptable for treatment today- except for slightly elevated creatinine-1.4.   #Mucositis from chemotherapy s/p magic mouth wash; improved; continue folic acid/magic mouth wash.    # elevated creat 1.4; improved; Blood pressure- 100s/ 70s; recommend holding  Blood pressure medications [norvasc/ HCTZ].   # follow up in 1 week/labs/possible fluids [given the poor social support/difficulty with the patient following recommendations/potential risk for side effects]  All questions were answered. The patient knows to  call the clinic with any problems, questions or concerns.   Cammie Sickle, MD 07/05/2017 1:41 PM

## 2017-07-05 NOTE — Patient Instructions (Signed)
STOP your Blood pressure medications [hydrochlorthiazinde/ HCTZ; and also amlodipine/norvasc ]- as you blood pressure is running low.

## 2017-07-05 NOTE — Assessment & Plan Note (Addendum)
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    Currently on  neoadjuvant chemotherapy with carboplatinum-Alimta  S/p cyle #1 appx 3 weeks ago; tolerated with moderate side effects from chemotherapy [see discussion below].  #Proceed with cycle #2 of neoadjuvant chemotherapy carbo Alimta today.  Labs today reviewed;  acceptable for treatment today- except for slightly elevated creatinine-1.4.   #Mucositis from chemotherapy s/p magic mouth wash; improved; continue folic acid/magic mouth wash.    # elevated creat 1.4; improved; Blood pressure- 100s/ 70s; recommend holding  Blood pressure medications [norvasc/ HCTZ].   # follow up in 1 week/labs/possible fluids [given the poor social support/difficulty with the patient following recommendations/potential risk for side effects]

## 2017-07-06 ENCOUNTER — Telehealth: Payer: Self-pay | Admitting: *Deleted

## 2017-07-06 ENCOUNTER — Inpatient Hospital Stay: Payer: Medicare Other

## 2017-07-06 VITALS — BP 97/57 | HR 82 | Temp 94.8°F | Resp 20

## 2017-07-06 DIAGNOSIS — C3411 Malignant neoplasm of upper lobe, right bronchus or lung: Secondary | ICD-10-CM | POA: Diagnosis not present

## 2017-07-06 MED ORDER — PEGFILGRASTIM-CBQV 6 MG/0.6ML ~~LOC~~ SOSY
6.0000 mg | PREFILLED_SYRINGE | Freq: Once | SUBCUTANEOUS | Status: AC
  Administered 2017-07-06: 6 mg via SUBCUTANEOUS
  Filled 2017-07-06: qty 0.6

## 2017-07-06 NOTE — Telephone Encounter (Signed)
Per Shirlean Mylar, patient contacted the patient [facility] today at 1539 to discuss his medications. "Ruben Diaz doesn't know when to take his pills." Returned phone call at [phone]. Pt's grand daughter answered the phone. I asked to speak to Ruben Reason. She states "he is not here." She could not tell me when he would return. I explained that I was calling from a doctor's office and returning his call. She states, "no one here made a phone call to any doctor's office."

## 2017-07-06 NOTE — Patient Instructions (Signed)
Pegfilgrastim injection What is this medicine? PEGFILGRASTIM (PEG fil gra stim) is a long-acting granulocyte colony-stimulating factor that stimulates the growth of neutrophils, a type of white blood cell important in the body's fight against infection. It is used to reduce the incidence of fever and infection in patients with certain types of cancer who are receiving chemotherapy that affects the bone marrow, and to increase survival after being exposed to high doses of radiation. This medicine may be used for other purposes; ask your health care provider or pharmacist if you have questions. COMMON BRAND NAME(S): Neulasta What should I tell my health care provider before I take this medicine? They need to know if you have any of these conditions: -kidney disease -latex allergy -ongoing radiation therapy -sickle cell disease -skin reactions to acrylic adhesives (On-Body Injector only) -an unusual or allergic reaction to pegfilgrastim, filgrastim, other medicines, foods, dyes, or preservatives -pregnant or trying to get pregnant -breast-feeding How should I use this medicine? This medicine is for injection under the skin. If you get this medicine at home, you will be taught how to prepare and give the pre-filled syringe or how to use the On-body Injector. Refer to the patient Instructions for Use for detailed instructions. Use exactly as directed. Tell your healthcare provider immediately if you suspect that the On-body Injector may not have performed as intended or if you suspect the use of the On-body Injector resulted in a missed or partial dose. It is important that you put your used needles and syringes in a special sharps container. Do not put them in a trash can. If you do not have a sharps container, call your pharmacist or healthcare provider to get one. Talk to your pediatrician regarding the use of this medicine in children. While this drug may be prescribed for selected conditions,  precautions do apply. Overdosage: If you think you have taken too much of this medicine contact a poison control center or emergency room at once. NOTE: This medicine is only for you. Do not share this medicine with others. What if I miss a dose? It is important not to miss your dose. Call your doctor or health care professional if you miss your dose. If you miss a dose due to an On-body Injector failure or leakage, a new dose should be administered as soon as possible using a single prefilled syringe for manual use. What may interact with this medicine? Interactions have not been studied. Give your health care provider a list of all the medicines, herbs, non-prescription drugs, or dietary supplements you use. Also tell them if you smoke, drink alcohol, or use illegal drugs. Some items may interact with your medicine. This list may not describe all possible interactions. Give your health care provider a list of all the medicines, herbs, non-prescription drugs, or dietary supplements you use. Also tell them if you smoke, drink alcohol, or use illegal drugs. Some items may interact with your medicine. What should I watch for while using this medicine? You may need blood work done while you are taking this medicine. If you are going to need a MRI, CT scan, or other procedure, tell your doctor that you are using this medicine (On-Body Injector only). What side effects may I notice from receiving this medicine? Side effects that you should report to your doctor or health care professional as soon as possible: -allergic reactions like skin rash, itching or hives, swelling of the face, lips, or tongue -dizziness -fever -pain, redness, or irritation at site   where injected -pinpoint red spots on the skin -red or dark-brown urine -shortness of breath or breathing problems -stomach or side pain, or pain at the shoulder -swelling -tiredness -trouble passing urine or change in the amount of urine Side  effects that usually do not require medical attention (report to your doctor or health care professional if they continue or are bothersome): -bone pain -muscle pain This list may not describe all possible side effects. Call your doctor for medical advice about side effects. You may report side effects to FDA at 1-800-FDA-1088. Where should I keep my medicine? Keep out of the reach of children. Store pre-filled syringes in a refrigerator between 2 and 8 degrees C (36 and 46 degrees F). Do not freeze. Keep in carton to protect from light. Throw away this medicine if it is left out of the refrigerator for more than 48 hours. Throw away any unused medicine after the expiration date. NOTE: This sheet is a summary. It may not cover all possible information. If you have questions about this medicine, talk to your doctor, pharmacist, or health care provider.  2018 Elsevier/Gold Standard (2016-03-11 12:58:03)  

## 2017-07-12 ENCOUNTER — Inpatient Hospital Stay (HOSPITAL_BASED_OUTPATIENT_CLINIC_OR_DEPARTMENT_OTHER): Payer: Medicare Other | Admitting: Internal Medicine

## 2017-07-12 ENCOUNTER — Inpatient Hospital Stay: Payer: Medicare Other

## 2017-07-12 VITALS — BP 124/68 | HR 60 | Temp 97.1°F | Resp 16 | Wt 201.5 lb

## 2017-07-12 DIAGNOSIS — I129 Hypertensive chronic kidney disease with stage 1 through stage 4 chronic kidney disease, or unspecified chronic kidney disease: Secondary | ICD-10-CM

## 2017-07-12 DIAGNOSIS — E785 Hyperlipidemia, unspecified: Secondary | ICD-10-CM | POA: Diagnosis not present

## 2017-07-12 DIAGNOSIS — N189 Chronic kidney disease, unspecified: Secondary | ICD-10-CM

## 2017-07-12 DIAGNOSIS — I252 Old myocardial infarction: Secondary | ICD-10-CM

## 2017-07-12 DIAGNOSIS — R0602 Shortness of breath: Secondary | ICD-10-CM

## 2017-07-12 DIAGNOSIS — C3411 Malignant neoplasm of upper lobe, right bronchus or lung: Secondary | ICD-10-CM

## 2017-07-12 DIAGNOSIS — N179 Acute kidney failure, unspecified: Secondary | ICD-10-CM | POA: Diagnosis not present

## 2017-07-12 DIAGNOSIS — Z7982 Long term (current) use of aspirin: Secondary | ICD-10-CM

## 2017-07-12 DIAGNOSIS — N400 Enlarged prostate without lower urinary tract symptoms: Secondary | ICD-10-CM

## 2017-07-12 DIAGNOSIS — K1231 Oral mucositis (ulcerative) due to antineoplastic therapy: Secondary | ICD-10-CM | POA: Diagnosis not present

## 2017-07-12 DIAGNOSIS — K219 Gastro-esophageal reflux disease without esophagitis: Secondary | ICD-10-CM

## 2017-07-12 DIAGNOSIS — Z79899 Other long term (current) drug therapy: Secondary | ICD-10-CM

## 2017-07-12 DIAGNOSIS — Z87891 Personal history of nicotine dependence: Secondary | ICD-10-CM

## 2017-07-12 DIAGNOSIS — R630 Anorexia: Secondary | ICD-10-CM

## 2017-07-12 DIAGNOSIS — I739 Peripheral vascular disease, unspecified: Secondary | ICD-10-CM

## 2017-07-12 LAB — CBC WITH DIFFERENTIAL/PLATELET
BLASTS: 0 %
Band Neutrophils: 3 %
Basophils Absolute: 0 10*3/uL (ref 0–0.1)
Basophils Relative: 0 %
EOS ABS: 0.2 10*3/uL (ref 0–0.7)
Eosinophils Relative: 4 %
HEMATOCRIT: 36.5 % — AB (ref 40.0–52.0)
HEMOGLOBIN: 12.3 g/dL — AB (ref 13.0–18.0)
LYMPHS PCT: 46 %
Lymphs Abs: 2.8 10*3/uL (ref 1.0–3.6)
MCH: 27.9 pg (ref 26.0–34.0)
MCHC: 33.7 g/dL (ref 32.0–36.0)
MCV: 82.7 fL (ref 80.0–100.0)
MYELOCYTES: 0 %
Metamyelocytes Relative: 0 %
Monocytes Absolute: 1.1 10*3/uL — ABNORMAL HIGH (ref 0.2–1.0)
Monocytes Relative: 18 %
NEUTROS PCT: 29 %
NRBC: 0 /100{WBCs}
Neutro Abs: 1.9 10*3/uL (ref 1.4–6.5)
OTHER: 0 %
PROMYELOCYTES RELATIVE: 0 %
Platelets: 197 10*3/uL (ref 150–440)
RBC: 4.41 MIL/uL (ref 4.40–5.90)
RDW: 14.9 % — AB (ref 11.5–14.5)
WBC: 6 10*3/uL (ref 3.8–10.6)

## 2017-07-12 LAB — BASIC METABOLIC PANEL
Anion gap: 6 (ref 5–15)
BUN: 20 mg/dL (ref 6–20)
CALCIUM: 8.8 mg/dL — AB (ref 8.9–10.3)
CO2: 27 mmol/L (ref 22–32)
CREATININE: 0.99 mg/dL (ref 0.61–1.24)
Chloride: 103 mmol/L (ref 101–111)
Glucose, Bld: 99 mg/dL (ref 65–99)
Potassium: 4 mmol/L (ref 3.5–5.1)
SODIUM: 136 mmol/L (ref 135–145)

## 2017-07-12 NOTE — Progress Notes (Signed)
[place] NOTE  Patient Care Team: Leonel Ramsay, MD as PCP - General (Infectious Diseases) Telford Nab, RN as Registered Nurse  CHIEF COMPLAINTS/PURPOSE OF CONSULTATION:     Oncology History   #  RUL Adeno Ca-~ 19 mm in size  with SUV of 21 [LDCT];- ADENOCARCINOMA, ACINAR AND SOLID PATTERN. Stage I [T1No] vs stage II [T3N0]  # march 19th- CARBO-ALIMTA q 3 w  # 2019 Feb Liver lesion/uptake on PET - incidental- MRI liver -NEG     Primary cancer of right upper lobe of lung ([place])     HISTORY OF PRESENTING ILLNESS:  Ruben Diaz 72 y.o.  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  Patient denies any significant difficulty with swallowing.  Denies any ulcers in the mouth.  His appetite is good.  Complains of mild shortness of breath otherwise no cough no fevers.  Denies any swelling in the legs.  Denies any dizzy spells.  ROS: A complete 10 point review of system is done which is negative except mentioned above in history of present illness  MEDICAL HISTORY:  Past Medical History:  Diagnosis Date  . Chronic kidney disease   . Dysphagia   . GERD (gastroesophageal reflux disease)   . History of BPH   . Hyperlipidemia   . Hypertension   . Myocardial infarction ([place])   . Peripheral vascular disease ([place])     SURGICAL HISTORY: Past Surgical History:  Procedure Laterality Date  . APPENDECTOMY    . COLONOSCOPY WITH PROPOFOL N/A 09/16/2016   Procedure: COLONOSCOPY WITH PROPOFOL;  Surgeon: [name], MD;  Location: [facility] ENDOSCOPY;  Service: Endoscopy;  Laterality: N/A;  . ESOPHAGOGASTRODUODENOSCOPY (EGD) WITH PROPOFOL N/A 09/16/2016   Procedure: ESOPHAGOGASTRODUODENOSCOPY (EGD) WITH PROPOFOL;  Surgeon: [name], MD;  Location: [facility] ENDOSCOPY;  Service: Endoscopy;  Laterality: N/A;  . ESOPHAGOGASTRODUODENOSCOPY (EGD) WITH PROPOFOL N/A 12/16/2016   Procedure: ESOPHAGOGASTRODUODENOSCOPY  (EGD) WITH PROPOFOL;  Surgeon: [name], MD;  Location: [facility] ENDOSCOPY;  Service: Endoscopy;  Laterality: N/A;  . STOMACH SURGERY      SOCIAL HISTORY: [place]; friend- geraldine; has 1 daughter;  Smoker quit.  Used work hosiery; previous alcohol; currently none.  Social History   Socioeconomic History  . Marital status: Single    Spouse name: Not on file  . Number of children: Not on file  . Years of education: Not on file  . Highest education level: Not on file  Occupational History  . Not on file  Social Needs  . Financial resource strain: Not on file  . Food insecurity:    Worry: Not on file    Inability: Not on file  . Transportation needs:    Medical: Not on file    Non-medical: Not on file  Tobacco Use  . Smoking status: Former Smoker    Packs/day: 0.70    Years: 51.00    Pack years: 35.70    Last attempt to quit: 2013    Years since quitting: 6.2  . Smokeless tobacco: Never Used  Substance and Sexual Activity  . Alcohol use: No  . Drug use: No  . Sexual activity: Not on file  Lifestyle  . Physical activity:    Days per week: Not on file    Minutes per session: Not on file  . Stress: Not on file  Relationships  . Social connections:    Talks on phone: Not  on file    Gets together: Not on file    Attends religious service: Not on file    Active member of club or organization: Not on file    Attends meetings of clubs or organizations: Not on file    Relationship status: Not on file  . Intimate partner violence:    Fear of current or ex partner: Not on file    Emotionally abused: Not on file    Physically abused: Not on file    Forced sexual activity: Not on file  Other Topics Concern  . Not on file  Social History Narrative  . Not on file    FAMILY HISTORY: No history of cancer in the family. No family history on file.  ALLERGIES:  is allergic to sucralfate.  MEDICATIONS:  Current Outpatient Medications  Medication Sig  Dispense Refill  . aspirin EC 81 MG tablet Take 81 mg by mouth daily.    Marland Kitchen dexamethasone (DECADRON) 4 MG tablet Take one pill AM & PM x 3 days; start the day prior to chemo. 30 tablet 0  . folic acid (FOLVITE) 1 MG tablet Take 1 tablet (1 mg total) by mouth daily. 90 tablet 1  . hydrochlorothiazide (HYDRODIURIL) 25 MG tablet Take 25 mg by mouth daily.    . magic mouthwash SOLN Take 5 mLs by mouth 4 (four) times daily as needed for mouth pain. Swish & swallow 240 mL 3  . pantoprazole (PROTONIX) 40 MG tablet Take 1 tablet by mouth daily.    . prochlorperazine (COMPAZINE) 10 MG tablet Take 1 tablet (10 mg total) by mouth every 6 (six) hours as needed for nausea or vomiting. 40 tablet 1  . sucralfate (CARAFATE) 1 g tablet Take by mouth.    Marland Kitchen amLODipine (NORVASC) 10 MG tablet Take 10 mg by mouth daily.    Marland Kitchen atorvastatin (LIPITOR) 10 MG tablet Take 10 mg by mouth daily.    . ondansetron (ZOFRAN) 8 MG tablet One pill every 8 hours as needed for nausea/vomitting. (Patient not taking: Reported on 07/12/2017) 40 tablet 1   No current facility-administered medications for this visit.       Marland Kitchen  PHYSICAL EXAMINATION: ECOG PERFORMANCE STATUS: 0 - Asymptomatic  Vitals:   07/12/17 0919  BP: 124/68  Pulse: 60  Resp: 16  Temp: (!) 97.1 F (36.2 C)   Filed Weights   07/12/17 0919  Weight: 201 lb 8 oz (91.4 kg)    GENERAL: Well-nourished well-developed; Alert, no distress and comfortable.  He is alone.  EYES: no pallor or icterus OROPHARYNX: no thrush or ulceration; poor dentition  NECK: supple, no masses felt LYMPH:  no palpable lymphadenopathy in the cervical, axillary or inguinal regions LUNGS: clear to auscultation and  No wheeze or crackles HEART/CVS: regular rate & rhythm and no murmurs; No lower extremity edema ABDOMEN: abdomen soft, non-tender and normal bowel sounds Musculoskeletal:no cyanosis of digits and no clubbing  PSYCH: alert & oriented x 3 with fluent speech NEURO: no focal  motor/sensory deficits SKIN:  no rashes or significant lesions  LABORATORY DATA:  I have reviewed the data as listed Lab Results  Component Value Date   WBC 6.0 07/12/2017   HGB 12.3 (L) 07/12/2017   HCT 36.5 (L) 07/12/2017   MCV 82.7 07/12/2017   PLT 197 07/12/2017   Recent Labs    06/21/17 0814 06/28/17 1008 07/05/17 0828 07/12/17 0900  NA 134* 133* 137 136  K 3.0* 3.5 3.4* 4.0  CL 93*  96* 99* 103  CO2 31 31 27 27   GLUCOSE 129* 136* 162* 99  BUN 29* 19 33* 20  CREATININE 1.34* 1.07 1.40* 0.99  CALCIUM 8.7* 8.6* 9.4 8.8*  GFRNONAA 51* >60 49* >60  GFRAA 59* >60 56* >60  PROT 8.0 8.3* 8.5*  --   ALBUMIN 3.3* 3.5 3.3*  --   AST 19 24 27   --   ALT 12* 14* 14*  --   ALKPHOS 52 75 65  --   BILITOT 1.1 0.3 0.4  --     RADIOGRAPHIC STUDIES: I have personally reviewed the radiological images as listed and agreed with the findings in the report. No results found.  ASSESSMENT & PLAN:   Primary cancer of right upper lobe of lung ([place]) #Adenocarcinoma stage I [T1No] vs stage II [T3N0] ; right upper lobe lung nodule 19 mm lung nodule to be 21.    Currently on  neoadjuvant chemotherapy with carboplatinum-Alimta  S/p cyle #2  appx 1 weeks ago.  Will reimage after 3 cycles.   # Acute renal failure- creatinine improved at 0.99 [from 1.4]; continue holding hydrochlorothiazide.   #Mucositis from chemotherapy s/p magic mouth wash; improved; continue folic acid/magic mouth wash.    # follow up in 2 weeks/labs/carbo-alimta; b12 injection.   All questions were answered. The patient knows to call the clinic with any problems, questions or concerns.   Cammie Sickle, MD 07/12/2017 10:29 AM

## 2017-07-12 NOTE — Assessment & Plan Note (Addendum)
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    Currently on  neoadjuvant chemotherapy with carboplatinum-Alimta  S/p cyle #2  appx 1 weeks ago.  Will reimage after 3 cycles.   # Acute renal failure- creatinine improved at 0.99 [from 1.4]; continue holding hydrochlorothiazide.   #Mucositis from chemotherapy s/p magic mouth wash; improved; continue folic acid/magic mouth wash.    # follow up in 2 weeks/labs/carbo-alimta; b12 injection.

## 2017-07-26 ENCOUNTER — Inpatient Hospital Stay: Payer: Medicare Other

## 2017-07-26 ENCOUNTER — Inpatient Hospital Stay (HOSPITAL_BASED_OUTPATIENT_CLINIC_OR_DEPARTMENT_OTHER): Payer: Medicare Other | Admitting: Internal Medicine

## 2017-07-26 VITALS — BP 171/92 | HR 73 | Temp 96.5°F | Resp 16

## 2017-07-26 DIAGNOSIS — K219 Gastro-esophageal reflux disease without esophagitis: Secondary | ICD-10-CM

## 2017-07-26 DIAGNOSIS — I129 Hypertensive chronic kidney disease with stage 1 through stage 4 chronic kidney disease, or unspecified chronic kidney disease: Secondary | ICD-10-CM

## 2017-07-26 DIAGNOSIS — C3411 Malignant neoplasm of upper lobe, right bronchus or lung: Secondary | ICD-10-CM

## 2017-07-26 DIAGNOSIS — N189 Chronic kidney disease, unspecified: Secondary | ICD-10-CM | POA: Diagnosis not present

## 2017-07-26 DIAGNOSIS — I252 Old myocardial infarction: Secondary | ICD-10-CM | POA: Diagnosis not present

## 2017-07-26 DIAGNOSIS — J302 Other seasonal allergic rhinitis: Secondary | ICD-10-CM | POA: Diagnosis not present

## 2017-07-26 DIAGNOSIS — R7989 Other specified abnormal findings of blood chemistry: Secondary | ICD-10-CM

## 2017-07-26 DIAGNOSIS — Z79899 Other long term (current) drug therapy: Secondary | ICD-10-CM

## 2017-07-26 DIAGNOSIS — H5789 Other specified disorders of eye and adnexa: Secondary | ICD-10-CM | POA: Diagnosis not present

## 2017-07-26 DIAGNOSIS — K1231 Oral mucositis (ulcerative) due to antineoplastic therapy: Secondary | ICD-10-CM | POA: Diagnosis not present

## 2017-07-26 DIAGNOSIS — Z7982 Long term (current) use of aspirin: Secondary | ICD-10-CM

## 2017-07-26 DIAGNOSIS — E785 Hyperlipidemia, unspecified: Secondary | ICD-10-CM

## 2017-07-26 DIAGNOSIS — I739 Peripheral vascular disease, unspecified: Secondary | ICD-10-CM | POA: Diagnosis not present

## 2017-07-26 DIAGNOSIS — Z87891 Personal history of nicotine dependence: Secondary | ICD-10-CM

## 2017-07-26 DIAGNOSIS — N400 Enlarged prostate without lower urinary tract symptoms: Secondary | ICD-10-CM | POA: Diagnosis not present

## 2017-07-26 LAB — COMPREHENSIVE METABOLIC PANEL
ALK PHOS: 59 U/L (ref 38–126)
ALT: 14 U/L — ABNORMAL LOW (ref 17–63)
AST: 26 U/L (ref 15–41)
Albumin: 3.4 g/dL — ABNORMAL LOW (ref 3.5–5.0)
Anion gap: 9 (ref 5–15)
BILIRUBIN TOTAL: 0.3 mg/dL (ref 0.3–1.2)
BUN: 19 mg/dL (ref 6–20)
CALCIUM: 8.6 mg/dL — AB (ref 8.9–10.3)
CO2: 25 mmol/L (ref 22–32)
Chloride: 102 mmol/L (ref 101–111)
Creatinine, Ser: 0.99 mg/dL (ref 0.61–1.24)
GFR calc non Af Amer: >60 mL/min (ref >60)
Glucose, Bld: 119 mg/dL — ABNORMAL HIGH (ref 65–99)
POTASSIUM: 3.3 mmol/L — AB (ref 3.5–5.1)
Sodium: 136 mmol/L (ref 135–145)
TOTAL PROTEIN: 7.4 g/dL (ref 6.5–8.1)

## 2017-07-26 LAB — CBC WITH DIFFERENTIAL/PLATELET
Basophils Absolute: 0.1 10*3/uL (ref 0–0.1)
Basophils Relative: 1 %
Eosinophils Absolute: 0 10*3/uL (ref 0–0.7)
Eosinophils Relative: 0 %
HEMATOCRIT: 35 % — AB (ref 40.0–52.0)
HEMOGLOBIN: 11.8 g/dL — AB (ref 13.0–18.0)
LYMPHS ABS: 1 10*3/uL (ref 1.0–3.6)
LYMPHS PCT: 11 %
MCH: 28 pg (ref 26.0–34.0)
MCHC: 33.6 g/dL (ref 32.0–36.0)
MCV: 83.5 fL (ref 80.0–100.0)
MONO ABS: 1.1 10*3/uL — AB (ref 0.2–1.0)
MONOS PCT: 13 %
NEUTROS ABS: 6.7 10*3/uL — AB (ref 1.4–6.5)
Neutrophils Relative %: 75 %
Platelets: 336 10*3/uL (ref 150–440)
RBC: 4.2 MIL/uL — ABNORMAL LOW (ref 4.40–5.90)
RDW: 16.7 % — AB (ref 11.5–14.5)
WBC: 8.9 10*3/uL (ref 3.8–10.6)

## 2017-07-26 MED ORDER — DEXAMETHASONE SODIUM PHOSPHATE 10 MG/ML IJ SOLN
10.0000 mg | Freq: Once | INTRAMUSCULAR | Status: AC
  Administered 2017-07-26: 10 mg via INTRAVENOUS
  Filled 2017-07-26: qty 1

## 2017-07-26 MED ORDER — PALONOSETRON HCL INJECTION 0.25 MG/5ML
0.2500 mg | Freq: Once | INTRAVENOUS | Status: AC
  Administered 2017-07-26: 0.25 mg via INTRAVENOUS
  Filled 2017-07-26: qty 5

## 2017-07-26 MED ORDER — SODIUM CHLORIDE 0.9 % IV SOLN
564.0000 mg | Freq: Once | INTRAVENOUS | Status: AC
  Administered 2017-07-26: 560 mg via INTRAVENOUS
  Filled 2017-07-26 (×3): qty 56

## 2017-07-26 MED ORDER — CYANOCOBALAMIN 1000 MCG/ML IJ SOLN
1000.0000 ug | Freq: Once | INTRAMUSCULAR | Status: AC
  Administered 2017-07-26: 1000 ug via INTRAMUSCULAR
  Filled 2017-07-26: qty 1

## 2017-07-26 MED ORDER — SODIUM CHLORIDE 0.9 % IV SOLN
Freq: Once | INTRAVENOUS | Status: AC
  Administered 2017-07-26: 10:00:00 via INTRAVENOUS
  Filled 2017-07-26: qty 1000

## 2017-07-26 MED ORDER — SODIUM CHLORIDE 0.9 % IV SOLN
1000.0000 mg | Freq: Once | INTRAVENOUS | Status: AC
  Administered 2017-07-26: 1000 mg via INTRAVENOUS
  Filled 2017-07-26 (×3): qty 40

## 2017-07-26 NOTE — Patient Instructions (Signed)
Recommend Zaditor eye drops [or similar anti-histamine eye drops- talk to phramacist] for itchy eyes.   # Take clairitin one pill a day [over the counter] for cough/allergies.   # start taking amlodipine [blood pressure medication]

## 2017-07-26 NOTE — Progress Notes (Signed)
[place] NOTE  Patient Care Team: Leonel Ramsay, MD as PCP - General (Infectious Diseases) Telford Nab, RN as Registered Nurse  CHIEF COMPLAINTS/PURPOSE OF CONSULTATION:     Oncology History   #  RUL [place] Ca-~ 19 mm in size  with SUV of 21 [LDCT];- ADENOCARCINOMA, ACINAR AND SOLID PATTERN. Stage I [T1No] vs stage II [T3N0]  # march 19th- CARBO-ALIMTA q 3 w  # 2019 Feb Liver lesion/uptake on PET - incidental- MRI liver -NEG     Primary cancer of right upper lobe of lung (HCC)     HISTORY OF PRESENTING ILLNESS:  Ruben Diaz 72 y.o.  male  Stage II adenoca- on neo-adj carbo-alimta s/p cylce #2 appx 3 weeks ago.  Patient complains of itchy eyes; runny nose and cough.  He continues to complain of mild difficulty swallowing which is currently improved.  He has been using Magic mouthwash as needed.  Complains of mild shortness of breath-which is not any worse.  Denies any swelling in the legs.  Denies any dizzy spells.  Denies any headaches.  ROS: A complete 10 point review of system is done which is negative except mentioned above in history of present illness  MEDICAL HISTORY:  Past Medical History:  Diagnosis Date  . Chronic kidney disease   . Dysphagia   . GERD (gastroesophageal reflux disease)   . History of BPH   . Hyperlipidemia   . Hypertension   . Myocardial infarction ([place])   . Peripheral vascular disease ([place])     SURGICAL HISTORY: Past Surgical History:  Procedure Laterality Date  . APPENDECTOMY    . COLONOSCOPY WITH PROPOFOL N/A 09/16/2016   Procedure: COLONOSCOPY WITH PROPOFOL;  Surgeon: [name], MD;  Location: [facility] ENDOSCOPY;  Service: Endoscopy;  Laterality: N/A;  . ESOPHAGOGASTRODUODENOSCOPY (EGD) WITH PROPOFOL N/A 09/16/2016   Procedure: ESOPHAGOGASTRODUODENOSCOPY (EGD) WITH PROPOFOL;  Surgeon: [name], MD;  Location: [facility] ENDOSCOPY;  Service: Endoscopy;  Laterality: N/A;  .  ESOPHAGOGASTRODUODENOSCOPY (EGD) WITH PROPOFOL N/A 12/16/2016   Procedure: ESOPHAGOGASTRODUODENOSCOPY (EGD) WITH PROPOFOL;  Surgeon: [name], MD;  Location: [facility] ENDOSCOPY;  Service: Endoscopy;  Laterality: N/A;  . STOMACH SURGERY      SOCIAL HISTORY: lives in [place]; friend- geraldine; has 1 daughter;  Smoker quit.  Used work hosiery; previous alcohol; currently none.  Social History   Socioeconomic History  . Marital status: Single    Spouse name: Not on file  . Number of children: Not on file  . Years of education: Not on file  . Highest education level: Not on file  Occupational History  . Not on file  Social Needs  . Financial resource strain: Not on file  . Food insecurity:    Worry: Not on file    Inability: Not on file  . Transportation needs:    Medical: Not on file    Non-medical: Not on file  Tobacco Use  . Smoking status: Former Smoker    Packs/day: 0.70    Years: 51.00    Pack years: 35.70    Last attempt to quit: 2013    Years since quitting: 6.3  . Smokeless tobacco: Never Used  Substance and Sexual Activity  . Alcohol use: No  . Drug use: No  . Sexual activity: Not on file  Lifestyle  . Physical activity:    Days per week: Not on file    Minutes per session: Not on file  . Stress: Not  on file  Relationships  . Social connections:    Talks on phone: Not on file    Gets together: Not on file    Attends religious service: Not on file    Active member of club or organization: Not on file    Attends meetings of clubs or organizations: Not on file    Relationship status: Not on file  . Intimate partner violence:    Fear of current or ex partner: Not on file    Emotionally abused: Not on file    Physically abused: Not on file    Forced sexual activity: Not on file  Other Topics Concern  . Not on file  Social History Narrative  . Not on file    FAMILY HISTORY: No history of cancer in the family. No family history on  file.  ALLERGIES:  is allergic to sucralfate.  MEDICATIONS:  Current Outpatient Medications  Medication Sig Dispense Refill  . aspirin EC 81 MG tablet Take 81 mg by mouth daily.    Marland Kitchen atorvastatin (LIPITOR) 10 MG tablet Take 10 mg by mouth daily.    Marland Kitchen dexamethasone (DECADRON) 4 MG tablet Take one pill AM & PM x 3 days; start the day prior to chemo. 30 tablet 0  . folic acid (FOLVITE) 1 MG tablet Take 1 tablet (1 mg total) by mouth daily. 90 tablet 1  . magic mouthwash SOLN Take 5 mLs by mouth 4 (four) times daily as needed for mouth pain. Swish & swallow 240 mL 3  . pantoprazole (PROTONIX) 40 MG tablet Take 1 tablet by mouth daily.    Marland Kitchen amLODipine (NORVASC) 10 MG tablet Take 10 mg by mouth daily.    . hydrochlorothiazide (HYDRODIURIL) 25 MG tablet Take 25 mg by mouth daily.    . ondansetron (ZOFRAN) 8 MG tablet One pill every 8 hours as needed for nausea/vomitting. (Patient not taking: Reported on 07/12/2017) 40 tablet 1  . prochlorperazine (COMPAZINE) 10 MG tablet Take 1 tablet (10 mg total) by mouth every 6 (six) hours as needed for nausea or vomiting. (Patient not taking: Reported on 07/26/2017) 40 tablet 1  . sucralfate (CARAFATE) 1 g tablet Take by mouth.     No current facility-administered medications for this visit.    Facility-Administered Medications Ordered in Other Visits  Medication Dose Route Frequency Provider Last Rate Last Dose  . CARBOplatin (PARAPLATIN) 560 mg in sodium chloride 0.9 % 250 mL chemo infusion  560 mg Intravenous Once Charlaine Dalton R, MD      . cyanocobalamin ((VITAMIN B-12)) injection 1,000 mcg  1,000 mcg Intramuscular Once Charlaine Dalton R, MD      . PEMEtrexed (ALIMTA) 1,000 mg in sodium chloride 0.9 % 100 mL chemo infusion  1,000 mg Intravenous Once Cammie Sickle, MD          .  PHYSICAL EXAMINATION: ECOG PERFORMANCE STATUS: 0 - Asymptomatic  Vitals:   07/26/17 0937  BP: (!) 158/69  Pulse: 73  Resp: 16  Temp: (!) 97.3 F (36.3  C)   Filed Weights   07/26/17 0937  Weight: 204 lb 4.1 oz (92.7 kg)    GENERAL: Well-nourished well-developed; Alert, no distress and comfortable.  He is alone.  EYES: no pallor or icterus OROPHARYNX: no thrush or ulceration; poor dentition  NECK: supple, no masses felt LYMPH:  no palpable lymphadenopathy in the cervical, axillary or inguinal regions LUNGS: clear to auscultation and  No wheeze or crackles HEART/CVS: regular rate & rhythm and  no murmurs; No lower extremity edema ABDOMEN: abdomen soft, non-tender and normal bowel sounds Musculoskeletal:no cyanosis of digits and no clubbing  PSYCH: alert & oriented x 3 with fluent speech NEURO: no focal motor/sensory deficits SKIN:  no rashes or significant lesions  LABORATORY DATA:  I have reviewed the data as listed Lab Results  Component Value Date   WBC 8.9 07/26/2017   HGB 11.8 (L) 07/26/2017   HCT 35.0 (L) 07/26/2017   MCV 83.5 07/26/2017   PLT 336 07/26/2017   Recent Labs    06/28/17 1008 07/05/17 0828 07/12/17 0900 07/26/17 0915  NA 133* 137 136 136  K 3.5 3.4* 4.0 3.3*  CL 96* 99* 103 102  CO2 31 27 27 25   GLUCOSE 136* 162* 99 119*  BUN 19 33* 20 19  CREATININE 1.07 1.40* 0.99 0.99  CALCIUM 8.6* 9.4 8.8* 8.6*  GFRNONAA >60 49* >60 >60  GFRAA >60 56* >60 >60  PROT 8.3* 8.5*  --  7.4  ALBUMIN 3.5 3.3*  --  3.4*  AST 24 27  --  26  ALT 14* 14*  --  14*  ALKPHOS 75 65  --  59  BILITOT 0.3 0.4  --  0.3    RADIOGRAPHIC STUDIES: I have personally reviewed the radiological images as listed and agreed with the findings in the report. No results found.  ASSESSMENT & PLAN:   Primary cancer of right upper lobe of lung ([place]) #Adenocarcinoma stage I [T1No] vs stage II [T3N0] ; right upper lobe lung nodule 19 mm lung nodule to be 21.  Currently on  neoadjuvant chemotherapy with carboplatinum-Alimta  S/p cyle #2 .  # we will proceed with cycle # 3 carbo-alimta today. Labs today reviewed;  acceptable for treatment  today.   Will reimage after this cycle.   # Acute renal failure-resolved.  Restart blood pressure medications [see discussion below]  # Elevated blood pressure-held blood pressure medications previously because of borderline blood pressures/and acute renal failure-today restart Norvasc only.  # Seasonal allergies- cough/itchy eyes-recommend Claritin/ketotifen eyedrops.  #Mucositis from chemotherapy s/p magic mouth wash; improved; continue folic acid/magic mouth wash.    #Follow-up in 1 week labs; possible IV fluids; will order CT scan at that visit  All questions were answered. The patient knows to call the clinic with any problems, questions or concerns.   Cammie Sickle, MD 07/26/2017 10:54 AM

## 2017-07-26 NOTE — Patient Instructions (Signed)
Cyanocobalamin, Vitamin B12 injection What is this medicine? CYANOCOBALAMIN (sye an oh koe BAL a min) is a man made form of vitamin B12. Vitamin B12 is used in the growth of healthy blood cells, nerve cells, and proteins in the body. It also helps with the metabolism of fats and carbohydrates. This medicine is used to treat people who can not absorb vitamin B12. This medicine may be used for other purposes; ask your health care provider or pharmacist if you have questions. COMMON BRAND NAME(S): B-12 Compliance Kit, B-12 Injection Kit, Cyomin, LA-12, Nutri-Twelve, Physicians EZ Use B-12, Primabalt What should I tell my health care provider before I take this medicine? They need to know if you have any of these conditions: -kidney disease -Leber's disease -megaloblastic anemia -an unusual or allergic reaction to cyanocobalamin, cobalt, other medicines, foods, dyes, or preservatives -pregnant or trying to get pregnant -breast-feeding How should I use this medicine? This medicine is injected into a muscle or deeply under the skin. It is usually given by a health care professional in a clinic or doctor's office. However, your doctor may teach you how to inject yourself. Follow all instructions. Talk to your pediatrician regarding the use of this medicine in children. Special care may be needed. Overdosage: If you think you have taken too much of this medicine contact a poison control center or emergency room at once. NOTE: This medicine is only for you. Do not share this medicine with others. What if I miss a dose? If you are given your dose at a clinic or doctor's office, call to reschedule your appointment. If you give your own injections and you miss a dose, take it as soon as you can. If it is almost time for your next dose, take only that dose. Do not take double or extra doses. What may interact with this medicine? -colchicine -heavy alcohol intake This list may not describe all possible  interactions. Give your health care provider a list of all the medicines, herbs, non-prescription drugs, or dietary supplements you use. Also tell them if you smoke, drink alcohol, or use illegal drugs. Some items may interact with your medicine. What should I watch for while using this medicine? Visit your doctor or health care professional regularly. You may need blood work done while you are taking this medicine. You may need to follow a special diet. Talk to your doctor. Limit your alcohol intake and avoid smoking to get the best benefit. What side effects may I notice from receiving this medicine? Side effects that you should report to your doctor or health care professional as soon as possible: -allergic reactions like skin rash, itching or hives, swelling of the face, lips, or tongue -blue tint to skin -chest tightness, pain -difficulty breathing, wheezing -dizziness -red, swollen painful area on the leg Side effects that usually do not require medical attention (report to your doctor or health care professional if they continue or are bothersome): -diarrhea -headache This list may not describe all possible side effects. Call your doctor for medical advice about side effects. You may report side effects to FDA at 1-800-FDA-1088. Where should I keep my medicine? Keep out of the reach of children. Store at room temperature between 15 and 30 degrees C (59 and 85 degrees F). Protect from light. Throw away any unused medicine after the expiration date. NOTE: This sheet is a summary. It may not cover all possible information. If you have questions about this medicine, talk to your doctor, pharmacist, or   health care provider.  2018 Elsevier/Gold Standard (2007-06-26 22:10:20) Carboplatin injection What is this medicine? CARBOPLATIN (KAR boe pla tin) is a chemotherapy drug. It targets fast dividing cells, like cancer cells, and causes these cells to die. This medicine is used to treat ovarian  cancer and many other cancers. This medicine may be used for other purposes; ask your health care provider or pharmacist if you have questions. COMMON BRAND NAME(S): Paraplatin What should I tell my health care provider before I take this medicine? They need to know if you have any of these conditions: -blood disorders -hearing problems -kidney disease -recent or ongoing radiation therapy -an unusual or allergic reaction to carboplatin, cisplatin, other chemotherapy, other medicines, foods, dyes, or preservatives -pregnant or trying to get pregnant -breast-feeding How should I use this medicine? This drug is usually given as an infusion into a vein. It is administered in a hospital or clinic by a specially trained health care professional. Talk to your pediatrician regarding the use of this medicine in children. Special care may be needed. Overdosage: If you think you have taken too much of this medicine contact a poison control center or emergency room at once. NOTE: This medicine is only for you. Do not share this medicine with others. What if I miss a dose? It is important not to miss a dose. Call your doctor or health care professional if you are unable to keep an appointment. What may interact with this medicine? -medicines for seizures -medicines to increase blood counts like filgrastim, pegfilgrastim, sargramostim -some antibiotics like amikacin, gentamicin, neomycin, streptomycin, tobramycin -vaccines Talk to your doctor or health care professional before taking any of these medicines: -acetaminophen -aspirin -ibuprofen -ketoprofen -naproxen This list may not describe all possible interactions. Give your health care provider a list of all the medicines, herbs, non-prescription drugs, or dietary supplements you use. Also tell them if you smoke, drink alcohol, or use illegal drugs. Some items may interact with your medicine. What should I watch for while using this  medicine? Your condition will be monitored carefully while you are receiving this medicine. You will need important blood work done while you are taking this medicine. This drug may make you feel generally unwell. This is not uncommon, as chemotherapy can affect healthy cells as well as cancer cells. Report any side effects. Continue your course of treatment even though you feel ill unless your doctor tells you to stop. In some cases, you may be given additional medicines to help with side effects. Follow all directions for their use. Call your doctor or health care professional for advice if you get a fever, chills or sore throat, or other symptoms of a cold or flu. Do not treat yourself. This drug decreases your body's ability to fight infections. Try to avoid being around people who are sick. This medicine may increase your risk to bruise or bleed. Call your doctor or health care professional if you notice any unusual bleeding. Be careful brushing and flossing your teeth or using a toothpick because you may get an infection or bleed more easily. If you have any dental work done, tell your dentist you are receiving this medicine. Avoid taking products that contain aspirin, acetaminophen, ibuprofen, naproxen, or ketoprofen unless instructed by your doctor. These medicines may hide a fever. Do not become pregnant while taking this medicine. Women should inform their doctor if they wish to become pregnant or think they might be pregnant. There is a potential for serious side effects to  an unborn child. Talk to your health care professional or pharmacist for more information. Do not breast-feed an infant while taking this medicine. What side effects may I notice from receiving this medicine? Side effects that you should report to your doctor or health care professional as soon as possible: -allergic reactions like skin rash, itching or hives, swelling of the face, lips, or tongue -signs of infection -  fever or chills, cough, sore throat, pain or difficulty passing urine -signs of decreased platelets or bleeding - bruising, pinpoint red spots on the skin, black, tarry stools, nosebleeds -signs of decreased red blood cells - unusually weak or tired, fainting spells, lightheadedness -breathing problems -changes in hearing -changes in vision -chest pain -high blood pressure -low blood counts - This drug may decrease the number of white blood cells, red blood cells and platelets. You may be at increased risk for infections and bleeding. -nausea and vomiting -pain, swelling, redness or irritation at the injection site -pain, tingling, numbness in the hands or feet -problems with balance, talking, walking -trouble passing urine or change in the amount of urine Side effects that usually do not require medical attention (report to your doctor or health care professional if they continue or are bothersome): -hair loss -loss of appetite -metallic taste in the mouth or changes in taste This list may not describe all possible side effects. Call your doctor for medical advice about side effects. You may report side effects to FDA at 1-800-FDA-1088. Where should I keep my medicine? This drug is given in a hospital or clinic and will not be stored at home. NOTE: This sheet is a summary. It may not cover all possible information. If you have questions about this medicine, talk to your doctor, pharmacist, or health care provider.  2018 Elsevier/Gold Standard (2007-06-20 14:38:05) Pemetrexed injection What is this medicine? PEMETREXED (PEM e TREX ed) is a chemotherapy drug used to treat lung cancers like non-small cell lung cancer and mesothelioma. It may also be used to treat other cancers. This medicine may be used for other purposes; ask your health care provider or pharmacist if you have questions. COMMON BRAND NAME(S): Alimta What should I tell my health care provider before I take this  medicine? They need to know if you have any of these conditions: -infection (especially a virus infection such as chickenpox, cold sores, or herpes) -kidney disease -low blood counts, like low white cell, platelet, or red cell counts -lung or breathing disease, like asthma -radiation therapy -an unusual or allergic reaction to pemetrexed, other medicines, foods, dyes, or preservative -pregnant or trying to get pregnant -breast-feeding How should I use this medicine? This drug is given as an infusion into a vein. It is administered in a hospital or clinic by a specially trained health care professional. Talk to your pediatrician regarding the use of this medicine in children. Special care may be needed. Overdosage: If you think you have taken too much of this medicine contact a poison control center or emergency room at once. NOTE: This medicine is only for you. Do not share this medicine with others. What if I miss a dose? It is important not to miss your dose. Call your doctor or health care professional if you are unable to keep an appointment. What may interact with this medicine? This medicine may interact with the following medications: -Ibuprofen This list may not describe all possible interactions. Give your health care provider a list of all the medicines, herbs, non-prescription drugs, or  dietary supplements you use. Also tell them if you smoke, drink alcohol, or use illegal drugs. Some items may interact with your medicine. What should I watch for while using this medicine? Visit your doctor for checks on your progress. This drug may make you feel generally unwell. This is not uncommon, as chemotherapy can affect healthy cells as well as cancer cells. Report any side effects. Continue your course of treatment even though you feel ill unless your doctor tells you to stop. In some cases, you may be given additional medicines to help with side effects. Follow all directions for their  use. Call your doctor or health care professional for advice if you get a fever, chills or sore throat, or other symptoms of a cold or flu. Do not treat yourself. This drug decreases your body's ability to fight infections. Try to avoid being around people who are sick. This medicine may increase your risk to bruise or bleed. Call your doctor or health care professional if you notice any unusual bleeding. Be careful brushing and flossing your teeth or using a toothpick because you may get an infection or bleed more easily. If you have any dental work done, tell your dentist you are receiving this medicine. Avoid taking products that contain aspirin, acetaminophen, ibuprofen, naproxen, or ketoprofen unless instructed by your doctor. These medicines may hide a fever. Call your doctor or health care professional if you get diarrhea or mouth sores. Do not treat yourself. To protect your kidneys, drink water or other fluids as directed while you are taking this medicine. Do not become pregnant while taking this medicine or for 6 months after stopping it. Women should inform their doctor if they wish to become pregnant or think they might be pregnant. Men should not father a child while taking this medicine and for 3 months after stopping it. This may interfere with the ability to father a child. You should talk to your doctor or health care professional if you are concerned about your fertility. There is a potential for serious side effects to an unborn child. Talk to your health care professional or pharmacist for more information. Do not breast-feed an infant while taking this medicine or for 1 week after stopping it. What side effects may I notice from receiving this medicine? Side effects that you should report to your doctor or health care professional as soon as possible: -allergic reactions like skin rash, itching or hives, swelling of the face, lips, or tongue -breathing problems -redness, blistering,  peeling or loosening of the skin, including inside the mouth -signs and symptoms of bleeding such as bloody or black, tarry stools; red or dark-brown urine; spitting up blood or brown material that looks like coffee grounds; red spots on the skin; unusual bruising or bleeding from the eye, gums, or nose -signs and symptoms of infection like fever or chills; cough; sore throat; pain or trouble passing urine -signs and symptoms of kidney injury like trouble passing urine or change in the amount of urine -signs and symptoms of liver injury like dark yellow or brown urine; general ill feeling or flu-like symptoms; light-colored stools; loss of appetite; nausea; right upper belly pain; unusually weak or tired; yellowing of the eyes or skin Side effects that usually do not require medical attention (report to your doctor or health care professional if they continue or are bothersome): -constipation -dizziness -mouth sores -nausea, vomiting -pain, tingling, numbness in the hands or feet -unusually weak or tired This list may not  describe all possible side effects. Call your doctor for medical advice about side effects. You may report side effects to FDA at 1-800-FDA-1088. Where should I keep my medicine? This drug is given in a hospital or clinic and will not be stored at home. NOTE: This sheet is a summary. It may not cover all possible information. If you have questions about this medicine, talk to your doctor, pharmacist, or health care provider.  2018 Elsevier/Gold Standard (2016-01-13 18:51:46)

## 2017-07-26 NOTE — Assessment & Plan Note (Addendum)
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  Currently on  neoadjuvant chemotherapy with carboplatinum-Alimta  S/p cyle #2 .  # we will proceed with cycle # 3 carbo-alimta today. Labs today reviewed;  acceptable for treatment today.   Will reimage after this cycle.   # Acute renal failure-resolved.  Restart blood pressure medications [see discussion below]  # Elevated blood pressure-held blood pressure medications previously because of borderline blood pressures/and acute renal failure-today restart Norvasc only.  # Seasonal allergies- cough/itchy eyes-recommend Claritin/ketotifen eyedrops.  #Mucositis from chemotherapy s/p magic mouth wash; improved; continue folic acid/magic mouth wash.    #Follow-up in 1 week labs; possible IV fluids; will order CT scan at that visit

## 2017-07-27 ENCOUNTER — Inpatient Hospital Stay: Payer: Medicare Other | Attending: Internal Medicine

## 2017-07-27 VITALS — BP 151/64 | HR 80 | Temp 98.6°F | Resp 16

## 2017-07-27 DIAGNOSIS — Z87891 Personal history of nicotine dependence: Secondary | ICD-10-CM | POA: Insufficient documentation

## 2017-07-27 DIAGNOSIS — R05 Cough: Secondary | ICD-10-CM | POA: Diagnosis not present

## 2017-07-27 DIAGNOSIS — Z7689 Persons encountering health services in other specified circumstances: Secondary | ICD-10-CM | POA: Insufficient documentation

## 2017-07-27 DIAGNOSIS — I739 Peripheral vascular disease, unspecified: Secondary | ICD-10-CM | POA: Diagnosis not present

## 2017-07-27 DIAGNOSIS — Z79899 Other long term (current) drug therapy: Secondary | ICD-10-CM | POA: Insufficient documentation

## 2017-07-27 DIAGNOSIS — N189 Chronic kidney disease, unspecified: Secondary | ICD-10-CM | POA: Insufficient documentation

## 2017-07-27 DIAGNOSIS — I252 Old myocardial infarction: Secondary | ICD-10-CM | POA: Diagnosis not present

## 2017-07-27 DIAGNOSIS — K219 Gastro-esophageal reflux disease without esophagitis: Secondary | ICD-10-CM | POA: Insufficient documentation

## 2017-07-27 DIAGNOSIS — I129 Hypertensive chronic kidney disease with stage 1 through stage 4 chronic kidney disease, or unspecified chronic kidney disease: Secondary | ICD-10-CM | POA: Insufficient documentation

## 2017-07-27 DIAGNOSIS — Z7982 Long term (current) use of aspirin: Secondary | ICD-10-CM | POA: Diagnosis not present

## 2017-07-27 DIAGNOSIS — Z5111 Encounter for antineoplastic chemotherapy: Secondary | ICD-10-CM | POA: Diagnosis not present

## 2017-07-27 DIAGNOSIS — C3411 Malignant neoplasm of upper lobe, right bronchus or lung: Secondary | ICD-10-CM | POA: Diagnosis not present

## 2017-07-27 DIAGNOSIS — K1231 Oral mucositis (ulcerative) due to antineoplastic therapy: Secondary | ICD-10-CM | POA: Diagnosis not present

## 2017-07-27 DIAGNOSIS — E785 Hyperlipidemia, unspecified: Secondary | ICD-10-CM | POA: Insufficient documentation

## 2017-07-27 DIAGNOSIS — N400 Enlarged prostate without lower urinary tract symptoms: Secondary | ICD-10-CM | POA: Insufficient documentation

## 2017-07-27 MED ORDER — PEGFILGRASTIM-CBQV 6 MG/0.6ML ~~LOC~~ SOSY
6.0000 mg | PREFILLED_SYRINGE | Freq: Once | SUBCUTANEOUS | Status: AC
  Administered 2017-07-27: 6 mg via SUBCUTANEOUS
  Filled 2017-07-27: qty 0.6

## 2017-07-27 NOTE — Patient Instructions (Signed)
Pegfilgrastim injection What is this medicine? PEGFILGRASTIM (PEG fil gra stim) is a long-acting granulocyte colony-stimulating factor that stimulates the growth of neutrophils, a type of white blood cell important in the body's fight against infection. It is used to reduce the incidence of fever and infection in patients with certain types of cancer who are receiving chemotherapy that affects the bone marrow, and to increase survival after being exposed to high doses of radiation. This medicine may be used for other purposes; ask your health care provider or pharmacist if you have questions. COMMON BRAND NAME(S): Neulasta What should I tell my health care provider before I take this medicine? They need to know if you have any of these conditions: -kidney disease -latex allergy -ongoing radiation therapy -sickle cell disease -skin reactions to acrylic adhesives (On-Body Injector only) -an unusual or allergic reaction to pegfilgrastim, filgrastim, other medicines, foods, dyes, or preservatives -pregnant or trying to get pregnant -breast-feeding How should I use this medicine? This medicine is for injection under the skin. If you get this medicine at home, you will be taught how to prepare and give the pre-filled syringe or how to use the On-body Injector. Refer to the patient Instructions for Use for detailed instructions. Use exactly as directed. Tell your healthcare provider immediately if you suspect that the On-body Injector may not have performed as intended or if you suspect the use of the On-body Injector resulted in a missed or partial dose. It is important that you put your used needles and syringes in a special sharps container. Do not put them in a trash can. If you do not have a sharps container, call your pharmacist or healthcare provider to get one. Talk to your pediatrician regarding the use of this medicine in children. While this drug may be prescribed for selected conditions,  precautions do apply. Overdosage: If you think you have taken too much of this medicine contact a poison control center or emergency room at once. NOTE: This medicine is only for you. Do not share this medicine with others. What if I miss a dose? It is important not to miss your dose. Call your doctor or health care professional if you miss your dose. If you miss a dose due to an On-body Injector failure or leakage, a new dose should be administered as soon as possible using a single prefilled syringe for manual use. What may interact with this medicine? Interactions have not been studied. Give your health care provider a list of all the medicines, herbs, non-prescription drugs, or dietary supplements you use. Also tell them if you smoke, drink alcohol, or use illegal drugs. Some items may interact with your medicine. This list may not describe all possible interactions. Give your health care provider a list of all the medicines, herbs, non-prescription drugs, or dietary supplements you use. Also tell them if you smoke, drink alcohol, or use illegal drugs. Some items may interact with your medicine. What should I watch for while using this medicine? You may need blood work done while you are taking this medicine. If you are going to need a MRI, CT scan, or other procedure, tell your doctor that you are using this medicine (On-Body Injector only). What side effects may I notice from receiving this medicine? Side effects that you should report to your doctor or health care professional as soon as possible: -allergic reactions like skin rash, itching or hives, swelling of the face, lips, or tongue -dizziness -fever -pain, redness, or irritation at site   where injected -pinpoint red spots on the skin -red or dark-brown urine -shortness of breath or breathing problems -stomach or side pain, or pain at the shoulder -swelling -tiredness -trouble passing urine or change in the amount of urine Side  effects that usually do not require medical attention (report to your doctor or health care professional if they continue or are bothersome): -bone pain -muscle pain This list may not describe all possible side effects. Call your doctor for medical advice about side effects. You may report side effects to FDA at 1-800-FDA-1088. Where should I keep my medicine? Keep out of the reach of children. Store pre-filled syringes in a refrigerator between 2 and 8 degrees C (36 and 46 degrees F). Do not freeze. Keep in carton to protect from light. Throw away this medicine if it is left out of the refrigerator for more than 48 hours. Throw away any unused medicine after the expiration date. NOTE: This sheet is a summary. It may not cover all possible information. If you have questions about this medicine, talk to your doctor, pharmacist, or health care provider.  2018 Elsevier/Gold Standard (2016-03-11 12:58:03)  

## 2017-08-02 ENCOUNTER — Inpatient Hospital Stay: Payer: Medicare Other

## 2017-08-02 ENCOUNTER — Inpatient Hospital Stay (HOSPITAL_BASED_OUTPATIENT_CLINIC_OR_DEPARTMENT_OTHER): Payer: Medicare Other | Admitting: Internal Medicine

## 2017-08-02 VITALS — BP 126/50 | HR 79 | Resp 16 | Wt 201.9 lb

## 2017-08-02 DIAGNOSIS — E785 Hyperlipidemia, unspecified: Secondary | ICD-10-CM

## 2017-08-02 DIAGNOSIS — I129 Hypertensive chronic kidney disease with stage 1 through stage 4 chronic kidney disease, or unspecified chronic kidney disease: Secondary | ICD-10-CM

## 2017-08-02 DIAGNOSIS — N400 Enlarged prostate without lower urinary tract symptoms: Secondary | ICD-10-CM

## 2017-08-02 DIAGNOSIS — Z79899 Other long term (current) drug therapy: Secondary | ICD-10-CM

## 2017-08-02 DIAGNOSIS — N189 Chronic kidney disease, unspecified: Secondary | ICD-10-CM | POA: Diagnosis not present

## 2017-08-02 DIAGNOSIS — I252 Old myocardial infarction: Secondary | ICD-10-CM

## 2017-08-02 DIAGNOSIS — C3411 Malignant neoplasm of upper lobe, right bronchus or lung: Secondary | ICD-10-CM | POA: Diagnosis not present

## 2017-08-02 DIAGNOSIS — I739 Peripheral vascular disease, unspecified: Secondary | ICD-10-CM

## 2017-08-02 DIAGNOSIS — K219 Gastro-esophageal reflux disease without esophagitis: Secondary | ICD-10-CM

## 2017-08-02 DIAGNOSIS — Z7982 Long term (current) use of aspirin: Secondary | ICD-10-CM

## 2017-08-02 DIAGNOSIS — K1231 Oral mucositis (ulcerative) due to antineoplastic therapy: Secondary | ICD-10-CM | POA: Diagnosis not present

## 2017-08-02 DIAGNOSIS — Z87891 Personal history of nicotine dependence: Secondary | ICD-10-CM

## 2017-08-02 LAB — CBC WITH DIFFERENTIAL/PLATELET
Basophils Absolute: 0 10*3/uL (ref 0–0.1)
Basophils Relative: 1 %
EOS PCT: 5 %
Eosinophils Absolute: 0.3 10*3/uL (ref 0–0.7)
HCT: 32.9 % — ABNORMAL LOW (ref 40.0–52.0)
Hemoglobin: 10.9 g/dL — ABNORMAL LOW (ref 13.0–18.0)
LYMPHS ABS: 1.4 10*3/uL (ref 1.0–3.6)
Lymphocytes Relative: 22 %
MCH: 27.9 pg (ref 26.0–34.0)
MCHC: 33.1 g/dL (ref 32.0–36.0)
MCV: 84.2 fL (ref 80.0–100.0)
MONO ABS: 0.8 10*3/uL (ref 0.2–1.0)
Monocytes Relative: 12 %
Neutro Abs: 4 10*3/uL (ref 1.4–6.5)
Neutrophils Relative %: 60 %
PLATELETS: 88 10*3/uL — AB (ref 150–440)
RBC: 3.91 MIL/uL — ABNORMAL LOW (ref 4.40–5.90)
RDW: 17.1 % — AB (ref 11.5–14.5)
WBC: 6.5 10*3/uL (ref 3.8–10.6)

## 2017-08-02 LAB — COMPREHENSIVE METABOLIC PANEL
ALBUMIN: 3.3 g/dL — AB (ref 3.5–5.0)
ALT: 14 U/L — AB (ref 17–63)
AST: 26 U/L (ref 15–41)
Alkaline Phosphatase: 68 U/L (ref 38–126)
Anion gap: 9 (ref 5–15)
BILIRUBIN TOTAL: 0.6 mg/dL (ref 0.3–1.2)
BUN: 17 mg/dL (ref 6–20)
CHLORIDE: 103 mmol/L (ref 101–111)
CO2: 27 mmol/L (ref 22–32)
CREATININE: 1.03 mg/dL (ref 0.61–1.24)
Calcium: 8.7 mg/dL — ABNORMAL LOW (ref 8.9–10.3)
GFR calc Af Amer: >60 mL/min (ref >60)
GFR calc non Af Amer: >60 mL/min (ref >60)
GLUCOSE: 134 mg/dL — AB (ref 65–99)
POTASSIUM: 3.1 mmol/L — AB (ref 3.5–5.1)
Sodium: 139 mmol/L (ref 135–145)
TOTAL PROTEIN: 7.4 g/dL (ref 6.5–8.1)

## 2017-08-02 NOTE — Progress Notes (Signed)
[place] OFFICE PROGRESS NOTE  Patient Care Team: Leonel Ramsay, MD as PCP - General (Infectious Diseases) Telford Nab, RN as Registered Nurse  Cancer Staging No matching staging information was found for the patient.   Oncology History   #  RUL Adeno Ca-~ 19 mm in size  with SUV of 21 [LDCT];- ADENOCARCINOMA, ACINAR AND SOLID PATTERN. Stage I [T1No] vs stage II [T3N0]  # march 19th- CARBO-ALIMTA q 3 w  # 2019 Feb Liver lesion/uptake on PET - incidental- MRI liver -NEG     Primary cancer of right upper lobe of lung ([place])      INTERVAL HISTORY:  Ruben Diaz 72 y.o.  male pleasant patient above history of stage II adenocarcinoma the lung/unresectable neoadjuvant chemotherapy Norma Fredrickson Alimta is here for follow-up.  Patient had cycle #3 carbo Alimta approximately 1 week ago.  Patient complains of sore mouth pain with swallowing.  Denies any food getting stuck.  No significant weight loss.  Has been drinking of fluids.  No dizziness no falls.  No swelling in the legs.  Patient has been using Magic mouthwash every other day.  No nausea vomiting.  Review of Systems  Constitutional: Negative for chills, diaphoresis, fever, malaise/fatigue and weight loss.  HENT: Negative for nosebleeds and sore throat.   Eyes: Negative for double vision.  Respiratory: Negative for cough, hemoptysis, sputum production, shortness of breath and wheezing.   Cardiovascular: Negative for chest pain, palpitations, orthopnea and leg swelling.  Gastrointestinal: Negative for abdominal pain, blood in stool, constipation, diarrhea, heartburn, melena, nausea and vomiting.  Genitourinary: Negative for dysuria, frequency and urgency.  Musculoskeletal: Negative for back pain and joint pain.  Skin: Negative.  Negative for itching and rash.  Neurological: Negative for dizziness, tingling, focal weakness, weakness and headaches.  Endo/Heme/Allergies: Does not bruise/bleed easily.   Psychiatric/Behavioral: Negative for depression. The patient is not nervous/anxious and does not have insomnia.       PAST MEDICAL HISTORY :  Past Medical History:  Diagnosis Date  . Chronic kidney disease   . Dysphagia   . GERD (gastroesophageal reflux disease)   . History of BPH   . Hyperlipidemia   . Hypertension   . Myocardial infarction ([place])   . Peripheral vascular disease ([place])     PAST SURGICAL HISTORY :   Past Surgical History:  Procedure Laterality Date  . APPENDECTOMY    . COLONOSCOPY WITH PROPOFOL N/A 09/16/2016   Procedure: COLONOSCOPY WITH PROPOFOL;  Surgeon: [name], MD;  Location: [facility] ENDOSCOPY;  Service: Endoscopy;  Laterality: N/A;  . ESOPHAGOGASTRODUODENOSCOPY (EGD) WITH PROPOFOL N/A 09/16/2016   Procedure: ESOPHAGOGASTRODUODENOSCOPY (EGD) WITH PROPOFOL;  Surgeon: [name], MD;  Location: [facility] ENDOSCOPY;  Service: Endoscopy;  Laterality: N/A;  . ESOPHAGOGASTRODUODENOSCOPY (EGD) WITH PROPOFOL N/A 12/16/2016   Procedure: ESOPHAGOGASTRODUODENOSCOPY (EGD) WITH PROPOFOL;  Surgeon: [name], MD;  Location: [facility] ENDOSCOPY;  Service: Endoscopy;  Laterality: N/A;  . STOMACH SURGERY      FAMILY HISTORY :  No family history on file.  SOCIAL HISTORY:   Social History   Tobacco Use  . Smoking status: Former Smoker    Packs/day: 0.70    Years: 51.00    Pack years: 35.70    Last attempt to quit: 2013    Years since quitting: 6.3  . Smokeless tobacco: Never Used  Substance Use Topics  . Alcohol use: No  . Drug use: No    ALLERGIES:  is allergic to sucralfate.  MEDICATIONS:  Current Outpatient Medications  Medication Sig Dispense Refill  . amLODipine (NORVASC) 10 MG tablet Take 10 mg by mouth daily.    Marland Kitchen aspirin EC 81 MG tablet Take 81 mg by mouth daily.    Marland Kitchen atorvastatin (LIPITOR) 10 MG tablet Take 10 mg by mouth daily.    Marland Kitchen dexamethasone (DECADRON) 4 MG tablet Take one pill AM & PM x 3 days; start the day prior to chemo. 30  tablet 0  . folic acid (FOLVITE) 1 MG tablet Take 1 tablet (1 mg total) by mouth daily. 90 tablet 1  . magic mouthwash SOLN Take 5 mLs by mouth 4 (four) times daily as needed for mouth pain. Swish & swallow 240 mL 3  . pantoprazole (PROTONIX) 40 MG tablet Take 1 tablet by mouth daily.    . prochlorperazine (COMPAZINE) 10 MG tablet Take 1 tablet (10 mg total) by mouth every 6 (six) hours as needed for nausea or vomiting. 40 tablet 1  . sucralfate (CARAFATE) 1 g tablet Take by mouth.    . hydrochlorothiazide (HYDRODIURIL) 25 MG tablet Take 25 mg by mouth daily.    . ondansetron (ZOFRAN) 8 MG tablet One pill every 8 hours as needed for nausea/vomitting. (Patient not taking: Reported on 08/02/2017) 40 tablet 1   No current facility-administered medications for this visit.     PHYSICAL EXAMINATION: ECOG PERFORMANCE STATUS: 1 - Symptomatic but completely ambulatory  BP (!) 126/50 (BP Location: Left Arm, Patient Position: Sitting)   Pulse 79   Resp 16   Wt 201 lb 15.1 oz (91.6 kg)   BMI 27.39 kg/m   Filed Weights   08/02/17 1414  Weight: 201 lb 15.1 oz (91.6 kg)    GENERAL: Well-nourished well-developed; Alert, no distress and comfortable.  Alone. EYES: no pallor or icterus OROPHARYNX: no thrush or ulceration; NECK: supple; no lymph nodes felt. LYMPH:  no palpable lymphadenopathy in the axillary or inguinal regions LUNGS: Decreased breath sounds auscultation bilaterally. No wheeze or crackles HEART/CVS: regular rate & rhythm and no murmurs; No lower extremity edema ABDOMEN:abdomen soft, non-tender and normal bowel sounds. No hepatomegaly or splenomegaly.  Musculoskeletal:no cyanosis of digits and no clubbing  PSYCH: alert & oriented x 3 with fluent speech NEURO: no focal motor/sensory deficits SKIN:  no rashes or significant lesions    LABORATORY DATA:  I have reviewed the data as listed    Component Value Date/Time   NA 139 08/02/2017 1352   NA 140 11/14/2013 2211   K 3.1 (L)  08/02/2017 1352   K 3.6 11/14/2013 2211   CL 103 08/02/2017 1352   CL 103 11/14/2013 2211   CO2 27 08/02/2017 1352   CO2 28 11/14/2013 2211   GLUCOSE 134 (H) 08/02/2017 1352   GLUCOSE 110 (H) 11/14/2013 2211   BUN 17 08/02/2017 1352   BUN 25 (H) 11/14/2013 2211   CREATININE 1.03 08/02/2017 1352   CREATININE 1.24 11/14/2013 2211   CALCIUM 8.7 (L) 08/02/2017 1352   CALCIUM 9.0 11/14/2013 2211   PROT 7.4 08/02/2017 1352   ALBUMIN 3.3 (L) 08/02/2017 1352   AST 26 08/02/2017 1352   ALT 14 (L) 08/02/2017 1352   ALKPHOS 68 08/02/2017 1352   BILITOT 0.6 08/02/2017 1352   GFRNONAA >60 08/02/2017 1352   GFRNONAA 59 (L) 11/14/2013 2211   GFRAA >60 08/02/2017 1352   GFRAA >60 11/14/2013 2211    No results found for: SPEP, UPEP  Lab Results  Component Value Date   WBC  6.5 08/02/2017   NEUTROABS 4.0 08/02/2017   HGB 10.9 (L) 08/02/2017   HCT 32.9 (L) 08/02/2017   MCV 84.2 08/02/2017   PLT 88 (L) 08/02/2017      Chemistry      Component Value Date/Time   NA 139 08/02/2017 1352   NA 140 11/14/2013 2211   K 3.1 (L) 08/02/2017 1352   K 3.6 11/14/2013 2211   CL 103 08/02/2017 1352   CL 103 11/14/2013 2211   CO2 27 08/02/2017 1352   CO2 28 11/14/2013 2211   BUN 17 08/02/2017 1352   BUN 25 (H) 11/14/2013 2211   CREATININE 1.03 08/02/2017 1352   CREATININE 1.24 11/14/2013 2211      Component Value Date/Time   CALCIUM 8.7 (L) 08/02/2017 1352   CALCIUM 9.0 11/14/2013 2211   ALKPHOS 68 08/02/2017 1352   AST 26 08/02/2017 1352   ALT 14 (L) 08/02/2017 1352   BILITOT 0.6 08/02/2017 1352       RADIOGRAPHIC STUDIES: I have personally reviewed the radiological images as listed and agreed with the findings in the report. No results found.   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  Currently on  neoadjuvant chemotherapy with  carboplatinum-Alimta  S/p cyle #3 today.  #Patient tolerating chemotherapy with mild to moderate difficulties-mucositis [see discussion below].  We will plan to get a CT scan after the cycle/in 2 weeks.  #Mucositis from chemotherapy- worsened; recommend magic mouth wash   #Elevated blood pressure-currently improved; continue to hold hydrochlorothiazide.  # follow up in 2 weeks/labs/ carbo-alimta; MD/ CT scan prior chest few days prior.    Orders Placed This Encounter  Procedures  . CT CHEST W CONTRAST    Standing Status:   Future    Standing Expiration Date:   08/03/2018    Order Specific Question:   If indicated for the ordered procedure, I authorize the administration of contrast media per Radiology protocol    Answer:   Yes    Order Specific Question:   Preferred imaging location?    Answer:   [facility]    Order Specific Question:   Radiology Contrast Protocol - do NOT remove file path    Answer:   [email or web address]  . CBC with Differential/Platelet    Standing Status:   Future    Standing Expiration Date:   08/03/2018  . Comprehensive metabolic panel    Standing Status:   Future    Standing Expiration Date:   08/03/2018  . CBC with Differential/Platelet    Standing Status:   Future    Standing Expiration Date:   08/03/2018  . Comprehensive metabolic panel    Standing Status:   Future    Standing Expiration Date:   08/03/2018   All questions were answered. The patient knows to call the clinic with any problems, questions or concerns.      Cammie Sickle, MD 08/02/2017 4:16 PM

## 2017-08-02 NOTE — Assessment & Plan Note (Addendum)
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  Currently on  neoadjuvant chemotherapy with carboplatinum-Alimta  S/p cyle #3 today.  #Patient tolerating chemotherapy with mild to moderate difficulties-mucositis [see discussion below].  We will plan to get a CT scan after the cycle/in 2 weeks.  #Mucositis from chemotherapy- worsened; recommend magic mouth wash   #Elevated blood pressure-currently improved; continue to hold hydrochlorothiazide.  # follow up in 2 weeks/labs/ carbo-alimta; MD/ CT scan prior chest few days prior. b12 at next visit.

## 2017-08-12 ENCOUNTER — Ambulatory Visit
Admission: RE | Admit: 2017-08-12 | Discharge: 2017-08-12 | Disposition: A | Discharge status: Home / Self Care | Payer: Medicare Other | Source: Ambulatory Visit | Attending: Internal Medicine | Admitting: Internal Medicine

## 2017-08-12 DIAGNOSIS — R918 Other nonspecific abnormal finding of lung field: Secondary | ICD-10-CM | POA: Diagnosis not present

## 2017-08-12 DIAGNOSIS — C3411 Malignant neoplasm of upper lobe, right bronchus or lung: Secondary | ICD-10-CM | POA: Diagnosis present

## 2017-08-12 DIAGNOSIS — I700 Atherosclerosis of aorta: Secondary | ICD-10-CM | POA: Diagnosis not present

## 2017-08-12 HISTORY — DX: Malignant (primary) neoplasm, unspecified: C80.1

## 2017-08-12 MED ORDER — IOPAMIDOL (ISOVUE-370) INJECTION 76%
75.0000 mL | Freq: Once | INTRAVENOUS | Status: AC | PRN
  Administered 2017-08-12: 75 mL via INTRAVENOUS

## 2017-08-16 ENCOUNTER — Inpatient Hospital Stay (HOSPITAL_BASED_OUTPATIENT_CLINIC_OR_DEPARTMENT_OTHER): Payer: Medicare Other | Admitting: Internal Medicine

## 2017-08-16 ENCOUNTER — Inpatient Hospital Stay: Payer: Medicare Other

## 2017-08-16 ENCOUNTER — Other Ambulatory Visit: Payer: Self-pay

## 2017-08-16 VITALS — BP 146/74 | HR 67 | Resp 20

## 2017-08-16 VITALS — BP 125/66 | HR 86 | Temp 98.0°F | Resp 20 | Ht 72.0 in | Wt 199.8 lb

## 2017-08-16 DIAGNOSIS — Z7982 Long term (current) use of aspirin: Secondary | ICD-10-CM

## 2017-08-16 DIAGNOSIS — I252 Old myocardial infarction: Secondary | ICD-10-CM | POA: Diagnosis not present

## 2017-08-16 DIAGNOSIS — C3411 Malignant neoplasm of upper lobe, right bronchus or lung: Secondary | ICD-10-CM | POA: Diagnosis not present

## 2017-08-16 DIAGNOSIS — Z79899 Other long term (current) drug therapy: Secondary | ICD-10-CM | POA: Diagnosis not present

## 2017-08-16 DIAGNOSIS — N189 Chronic kidney disease, unspecified: Secondary | ICD-10-CM

## 2017-08-16 DIAGNOSIS — Z87891 Personal history of nicotine dependence: Secondary | ICD-10-CM

## 2017-08-16 DIAGNOSIS — I739 Peripheral vascular disease, unspecified: Secondary | ICD-10-CM

## 2017-08-16 DIAGNOSIS — K219 Gastro-esophageal reflux disease without esophagitis: Secondary | ICD-10-CM

## 2017-08-16 DIAGNOSIS — E785 Hyperlipidemia, unspecified: Secondary | ICD-10-CM

## 2017-08-16 DIAGNOSIS — K1231 Oral mucositis (ulcerative) due to antineoplastic therapy: Secondary | ICD-10-CM | POA: Diagnosis not present

## 2017-08-16 DIAGNOSIS — R05 Cough: Secondary | ICD-10-CM | POA: Diagnosis not present

## 2017-08-16 DIAGNOSIS — N400 Enlarged prostate without lower urinary tract symptoms: Secondary | ICD-10-CM | POA: Diagnosis not present

## 2017-08-16 DIAGNOSIS — I129 Hypertensive chronic kidney disease with stage 1 through stage 4 chronic kidney disease, or unspecified chronic kidney disease: Secondary | ICD-10-CM

## 2017-08-16 LAB — COMPREHENSIVE METABOLIC PANEL
ALK PHOS: 59 U/L (ref 38–126)
ALT: 10 U/L — AB (ref 17–63)
ANION GAP: 12 (ref 5–15)
AST: 26 U/L (ref 15–41)
Albumin: 3.3 g/dL — ABNORMAL LOW (ref 3.5–5.0)
BILIRUBIN TOTAL: 0.4 mg/dL (ref 0.3–1.2)
BUN: 16 mg/dL (ref 6–20)
CALCIUM: 8.8 mg/dL — AB (ref 8.9–10.3)
CO2: 23 mmol/L (ref 22–32)
CREATININE: 0.9 mg/dL (ref 0.61–1.24)
Chloride: 101 mmol/L (ref 101–111)
GFR calc non Af Amer: >60 mL/min (ref >60)
Glucose, Bld: 174 mg/dL — ABNORMAL HIGH (ref 65–99)
Potassium: 3.2 mmol/L — ABNORMAL LOW (ref 3.5–5.1)
Sodium: 136 mmol/L (ref 135–145)
TOTAL PROTEIN: 7.9 g/dL (ref 6.5–8.1)

## 2017-08-16 LAB — CBC WITH DIFFERENTIAL/PLATELET
BASOS ABS: 0 10*3/uL (ref 0–0.1)
BASOS PCT: 0 %
EOS ABS: 0 10*3/uL (ref 0–0.7)
Eosinophils Relative: 0 %
HCT: 32.9 % — ABNORMAL LOW (ref 40.0–52.0)
HEMOGLOBIN: 10.9 g/dL — AB (ref 13.0–18.0)
Lymphocytes Relative: 11 %
Lymphs Abs: 1.2 10*3/uL (ref 1.0–3.6)
MCH: 28.3 pg (ref 26.0–34.0)
MCHC: 33.2 g/dL (ref 32.0–36.0)
MCV: 85.3 fL (ref 80.0–100.0)
MONO ABS: 0.9 10*3/uL (ref 0.2–1.0)
Monocytes Relative: 8 %
NEUTROS ABS: 9.2 10*3/uL — AB (ref 1.4–6.5)
NEUTROS PCT: 81 %
Platelets: 411 10*3/uL (ref 150–440)
RBC: 3.86 MIL/uL — ABNORMAL LOW (ref 4.40–5.90)
RDW: 17.5 % — AB (ref 11.5–14.5)
WBC: 11.3 10*3/uL — ABNORMAL HIGH (ref 3.8–10.6)

## 2017-08-16 MED ORDER — SODIUM CHLORIDE 0.9 % IV SOLN
Freq: Once | INTRAVENOUS | Status: AC
  Administered 2017-08-16: 11:00:00 via INTRAVENOUS
  Filled 2017-08-16: qty 1000

## 2017-08-16 MED ORDER — DEXAMETHASONE SODIUM PHOSPHATE 10 MG/ML IJ SOLN
10.0000 mg | Freq: Once | INTRAMUSCULAR | Status: AC
  Administered 2017-08-16: 10 mg via INTRAVENOUS

## 2017-08-16 MED ORDER — SODIUM CHLORIDE 0.9 % IV SOLN
1000.0000 mg | Freq: Once | INTRAVENOUS | Status: AC
  Administered 2017-08-16: 1000 mg via INTRAVENOUS
  Filled 2017-08-16: qty 40

## 2017-08-16 MED ORDER — SODIUM CHLORIDE 0.9 % IV SOLN
564.0000 mg | Freq: Once | INTRAVENOUS | Status: AC
  Administered 2017-08-16: 560 mg via INTRAVENOUS
  Filled 2017-08-16: qty 56

## 2017-08-16 MED ORDER — PALONOSETRON HCL INJECTION 0.25 MG/5ML
0.2500 mg | Freq: Once | INTRAVENOUS | Status: AC
  Administered 2017-08-16: 0.25 mg via INTRAVENOUS

## 2017-08-16 NOTE — Assessment & Plan Note (Addendum)
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    #Currently on neoadjuvant chemotherapy with [place] Alimta status post cycle #3; CT scan shows improved response.  We will also reviewed the tumor conference regarding further planning for surgery.   #Proceed with cycle #4 of carbo Alimta; Labs today reviewed;  acceptable for treatment today.   #Mucositis from chemotherapy- improved; continue magic mouth wash   #Elevated blood pressure-currently improved; continue to hold hydrochlorothiazide.  # follow up in 2 weeks/labs/ no treatment.   # I reviewed the blood work- with the patient in detail; also reviewed the imaging independently [as summarized above]; and with the patient in detail.

## 2017-08-16 NOTE — Progress Notes (Signed)
[place] OFFICE PROGRESS NOTE  Patient Care Team: Leonel Ramsay, MD as PCP - General (Infectious Diseases) Telford Nab, RN as Registered Nurse  Cancer Staging No matching staging information was found for the patient.   Oncology History   #  RUL Adeno Ca-~ 19 mm in size  with SUV of 21 [LDCT];- ADENOCARCINOMA, ACINAR AND SOLID PATTERN. Stage I [T1No] vs stage II [T3N0]  # march 19th- CARBO-ALIMTA q 3 w  # 2019 Feb Liver lesion/uptake on PET - incidental- MRI liver -NEG  DIAGNOSIS: [ MARCH 2019]  STAGE:  II  ;GOALS: curative  CURRENT/MOST RECENT THERAPY [ MARCH 2019] Neo-Adj carbo-alimta      Primary cancer of right upper lobe of lung ([place])      INTERVAL HISTORY:  Ruben Diaz 72 y.o.  male pleasant patient above history of adenocarcinoma the lung currently on neoadjuvant chemotherapy is here for follow-up.  Patient has chronic mild shortness of breath.  Chronic mild cough.  Sores in the mouth improved.  No swelling in the legs.  Review of Systems  Constitutional: Negative for chills, diaphoresis, fever, malaise/fatigue and weight loss.  HENT: Positive for sore throat. Negative for nosebleeds.   Eyes: Negative for double vision.  Respiratory: Positive for shortness of breath. Negative for cough, hemoptysis, sputum production and wheezing.   Cardiovascular: Negative for chest pain, palpitations, orthopnea and leg swelling.  Gastrointestinal: Negative for abdominal pain, blood in stool, constipation, diarrhea, heartburn, melena, nausea and vomiting.  Genitourinary: Negative for dysuria, frequency and urgency.  Musculoskeletal: Negative for back pain and joint pain.  Skin: Negative.  Negative for itching and rash.  Neurological: Negative for dizziness, tingling, focal weakness, weakness and headaches.  Endo/Heme/Allergies: Does not bruise/bleed easily.  Psychiatric/Behavioral: Negative for depression. The patient is not nervous/anxious  and does not have insomnia.       PAST MEDICAL HISTORY :  Past Medical History:  Diagnosis Date  . Cancer ([place])   . Chronic kidney disease   . Dysphagia   . GERD (gastroesophageal reflux disease)   . History of BPH   . Hyperlipidemia   . Hypertension   . Myocardial infarction ([place])   . Peripheral vascular disease ([place])     PAST SURGICAL HISTORY :   Past Surgical History:  Procedure Laterality Date  . APPENDECTOMY    . COLONOSCOPY WITH PROPOFOL N/A 09/16/2016   Procedure: COLONOSCOPY WITH PROPOFOL;  Surgeon: [name], MD;  Location: [facility] ENDOSCOPY;  Service: Endoscopy;  Laterality: N/A;  . ESOPHAGOGASTRODUODENOSCOPY (EGD) WITH PROPOFOL N/A 09/16/2016   Procedure: ESOPHAGOGASTRODUODENOSCOPY (EGD) WITH PROPOFOL;  Surgeon: [name], MD;  Location: [facility] ENDOSCOPY;  Service: Endoscopy;  Laterality: N/A;  . ESOPHAGOGASTRODUODENOSCOPY (EGD) WITH PROPOFOL N/A 12/16/2016   Procedure: ESOPHAGOGASTRODUODENOSCOPY (EGD) WITH PROPOFOL;  Surgeon: [name], MD;  Location: [facility] ENDOSCOPY;  Service: Endoscopy;  Laterality: N/A;  . STOMACH SURGERY      FAMILY HISTORY :  No family history on file.  SOCIAL HISTORY:   Social History   Tobacco Use  . Smoking status: Former Smoker    Packs/day: 0.70    Years: 51.00    Pack years: 35.70    Last attempt to quit: 2013    Years since quitting: 6.3  . Smokeless tobacco: Never Used  Substance Use Topics  . Alcohol use: No  . Drug use: No    ALLERGIES:  is allergic to sucralfate.  MEDICATIONS:  Current Outpatient Medications  Medication Sig Dispense Refill  .  amLODipine (NORVASC) 10 MG tablet Take 10 mg by mouth daily.    Marland Kitchen aspirin EC 81 MG tablet Take 81 mg by mouth daily.    Marland Kitchen atorvastatin (LIPITOR) 10 MG tablet Take 10 mg by mouth daily.    Marland Kitchen dexamethasone (DECADRON) 4 MG tablet Take one pill AM & PM x 3 days; start the day prior to chemo. 30 tablet 0  . folic acid (FOLVITE) 1 MG tablet Take 1 tablet (1 mg  total) by mouth daily. 90 tablet 1  . hydrochlorothiazide (HYDRODIURIL) 25 MG tablet Take 25 mg by mouth daily.    . pantoprazole (PROTONIX) 40 MG tablet Take 1 tablet by mouth daily.    . sucralfate (CARAFATE) 1 g tablet Take by mouth.    . magic mouthwash SOLN Take 5 mLs by mouth 4 (four) times daily as needed for mouth pain. Swish & swallow (Patient not taking: Reported on 08/16/2017) 240 mL 3  . ondansetron (ZOFRAN) 8 MG tablet One pill every 8 hours as needed for nausea/vomitting. (Patient not taking: Reported on 08/02/2017) 40 tablet 1  . prochlorperazine (COMPAZINE) 10 MG tablet Take 1 tablet (10 mg total) by mouth every 6 (six) hours as needed for nausea or vomiting. (Patient not taking: Reported on 08/16/2017) 40 tablet 1   No current facility-administered medications for this visit.     PHYSICAL EXAMINATION: ECOG PERFORMANCE STATUS: 1 - Symptomatic but completely ambulatory  BP 125/66 (BP Location: Left Arm, Patient Position: Sitting)   Pulse 86   Temp 98 F (36.7 C) (Tympanic)   Resp 20   Ht 6' (1.829 m)   Wt 199 lb 12.8 oz (90.6 kg)   BMI 27.10 kg/m   Filed Weights   08/16/17 0946  Weight: 199 lb 12.8 oz (90.6 kg)    GENERAL: Well-nourished well-developed; Alert, no distress and comfortable.  1. EYES: no pallor or icterus OROPHARYNX: no thrush or ulceration; NECK: supple; no lymph nodes felt. LYMPH:  no palpable lymphadenopathy in the axillary or inguinal regions LUNGS: Decreased breath sounds auscultation bilaterally. No wheeze or crackles HEART/CVS: regular rate & rhythm and no murmurs; No lower extremity edema ABDOMEN:abdomen soft, non-tender and normal bowel sounds. No hepatomegaly or splenomegaly.  Musculoskeletal:no cyanosis of digits and no clubbing  PSYCH: alert & oriented x 3 with fluent speech NEURO: no focal motor/sensory deficits SKIN:  no rashes or significant lesions    LABORATORY DATA:  I have reviewed the data as listed    Component Value  Date/Time   NA 136 08/16/2017 0929   NA 140 11/14/2013 2211   K 3.2 (L) 08/16/2017 0929   K 3.6 11/14/2013 2211   CL 101 08/16/2017 0929   CL 103 11/14/2013 2211   CO2 23 08/16/2017 0929   CO2 28 11/14/2013 2211   GLUCOSE 174 (H) 08/16/2017 0929   GLUCOSE 110 (H) 11/14/2013 2211   BUN 16 08/16/2017 0929   BUN 25 (H) 11/14/2013 2211   CREATININE 0.90 08/16/2017 0929   CREATININE 1.24 11/14/2013 2211   CALCIUM 8.8 (L) 08/16/2017 0929   CALCIUM 9.0 11/14/2013 2211   PROT 7.9 08/16/2017 0929   ALBUMIN 3.3 (L) 08/16/2017 0929   AST 26 08/16/2017 0929   ALT 10 (L) 08/16/2017 0929   ALKPHOS 59 08/16/2017 0929   BILITOT 0.4 08/16/2017 0929   GFRNONAA >60 08/16/2017 0929   GFRNONAA 59 (L) 11/14/2013 2211   GFRAA >60 08/16/2017 0929   GFRAA >60 11/14/2013 2211    No results found  for: SPEP, UPEP  Lab Results  Component Value Date   WBC 11.3 (H) 08/16/2017   NEUTROABS 9.2 (H) 08/16/2017   HGB 10.9 (L) 08/16/2017   HCT 32.9 (L) 08/16/2017   MCV 85.3 08/16/2017   PLT 411 08/16/2017      Chemistry      Component Value Date/Time   NA 136 08/16/2017 0929   NA 140 11/14/2013 2211   K 3.2 (L) 08/16/2017 0929   K 3.6 11/14/2013 2211   CL 101 08/16/2017 0929   CL 103 11/14/2013 2211   CO2 23 08/16/2017 0929   CO2 28 11/14/2013 2211   BUN 16 08/16/2017 0929   BUN 25 (H) 11/14/2013 2211   CREATININE 0.90 08/16/2017 0929   CREATININE 1.24 11/14/2013 2211      Component Value Date/Time   CALCIUM 8.8 (L) 08/16/2017 0929   CALCIUM 9.0 11/14/2013 2211   ALKPHOS 59 08/16/2017 0929   AST 26 08/16/2017 0929   ALT 10 (L) 08/16/2017 0929   BILITOT 0.4 08/16/2017 0929       RADIOGRAPHIC STUDIES: I have personally reviewed the radiological images as listed and agreed with the findings in the report. No results found.   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    #Currently on neoadjuvant chemotherapy with [place] Alimta status post cycle #3; CT scan shows improved response.  We will also reviewed the tumor conference regarding further planning for surgery.   #Proceed with cycle #4 of carbo Alimta; Labs today reviewed;  acceptable for treatment today.   #Mucositis from chemotherapy- improved; continue magic mouth wash   #Elevated blood pressure-currently improved; continue to hold hydrochlorothiazide.  # follow up in 2 weeks/labs/ no treatment.   # I reviewed the blood work- with the patient in detail; also reviewed the imaging independently [as summarized above]; and with the patient in detail.     Orders Placed This Encounter  Procedures  . CBC with Differential/Platelet    Standing Status:   Future    Standing Expiration Date:   08/17/2018  . Basic metabolic panel    Standing Status:   Future    Standing Expiration Date:   08/17/2018   All questions were answered. The patient knows to call the clinic with any problems, questions or concerns.      Cammie Sickle, MD 08/16/2017 3:38 PM

## 2017-08-16 NOTE — Patient Instructions (Signed)
Carboplatin injection What is this medicine? CARBOPLATIN (KAR boe pla tin) is a chemotherapy drug. It targets fast dividing cells, like cancer cells, and causes these cells to die. This medicine is used to treat ovarian cancer and many other cancers. This medicine may be used for other purposes; ask your health care provider or pharmacist if you have questions. COMMON BRAND NAME(S): Paraplatin What should I tell my health care provider before I take this medicine? They need to know if you have any of these conditions: -blood disorders -hearing problems -kidney disease -recent or ongoing radiation therapy -an unusual or allergic reaction to carboplatin, cisplatin, other chemotherapy, other medicines, foods, dyes, or preservatives -pregnant or trying to get pregnant -breast-feeding How should I use this medicine? This drug is usually given as an infusion into a vein. It is administered in a hospital or clinic by a specially trained health care professional. Talk to your pediatrician regarding the use of this medicine in children. Special care may be needed. Overdosage: If you think you have taken too much of this medicine contact a poison control center or emergency room at once. NOTE: This medicine is only for you. Do not share this medicine with others. What if I miss a dose? It is important not to miss a dose. Call your doctor or health care professional if you are unable to keep an appointment. What may interact with this medicine? -medicines for seizures -medicines to increase blood counts like filgrastim, pegfilgrastim, sargramostim -some antibiotics like amikacin, gentamicin, neomycin, streptomycin, tobramycin -vaccines Talk to your doctor or health care professional before taking any of these medicines: -acetaminophen -aspirin -ibuprofen -ketoprofen -naproxen This list may not describe all possible interactions. Give your health care provider a list of all the medicines, herbs,  non-prescription drugs, or dietary supplements you use. Also tell them if you smoke, drink alcohol, or use illegal drugs. Some items may interact with your medicine. What should I watch for while using this medicine? Your condition will be monitored carefully while you are receiving this medicine. You will need important blood work done while you are taking this medicine. This drug may make you feel generally unwell. This is not uncommon, as chemotherapy can affect healthy cells as well as cancer cells. Report any side effects. Continue your course of treatment even though you feel ill unless your doctor tells you to stop. In some cases, you may be given additional medicines to help with side effects. Follow all directions for their use. Call your doctor or health care professional for advice if you get a fever, chills or sore throat, or other symptoms of a cold or flu. Do not treat yourself. This drug decreases your body's ability to fight infections. Try to avoid being around people who are sick. This medicine may increase your risk to bruise or bleed. Call your doctor or health care professional if you notice any unusual bleeding. Be careful brushing and flossing your teeth or using a toothpick because you may get an infection or bleed more easily. If you have any dental work done, tell your dentist you are receiving this medicine. Avoid taking products that contain aspirin, acetaminophen, ibuprofen, naproxen, or ketoprofen unless instructed by your doctor. These medicines may hide a fever. Do not become pregnant while taking this medicine. Women should inform their doctor if they wish to become pregnant or think they might be pregnant. There is a potential for serious side effects to an unborn child. Talk to your health care professional or  pharmacist for more information. Do not breast-feed an infant while taking this medicine. What side effects may I notice from receiving this medicine? Side effects  that you should report to your doctor or health care professional as soon as possible: -allergic reactions like skin rash, itching or hives, swelling of the face, lips, or tongue -signs of infection - fever or chills, cough, sore throat, pain or difficulty passing urine -signs of decreased platelets or bleeding - bruising, pinpoint red spots on the skin, black, tarry stools, nosebleeds -signs of decreased red blood cells - unusually weak or tired, fainting spells, lightheadedness -breathing problems -changes in hearing -changes in vision -chest pain -high blood pressure -low blood counts - This drug may decrease the number of white blood cells, red blood cells and platelets. You may be at increased risk for infections and bleeding. -nausea and vomiting -pain, swelling, redness or irritation at the injection site -pain, tingling, numbness in the hands or feet -problems with balance, talking, walking -trouble passing urine or change in the amount of urine Side effects that usually do not require medical attention (report to your doctor or health care professional if they continue or are bothersome): -hair loss -loss of appetite -metallic taste in the mouth or changes in taste This list may not describe all possible side effects. Call your doctor for medical advice about side effects. You may report side effects to FDA at 1-800-FDA-1088. Where should I keep my medicine? This drug is given in a hospital or clinic and will not be stored at home. NOTE: This sheet is a summary. It may not cover all possible information. If you have questions about this medicine, talk to your doctor, pharmacist, or health care provider.  2018 Elsevier/Gold Standard (2007-06-20 14:38:05) Pemetrexed injection What is this medicine? PEMETREXED (PEM e TREX ed) is a chemotherapy drug used to treat lung cancers like non-small cell lung cancer and mesothelioma. It may also be used to treat other cancers. This medicine  may be used for other purposes; ask your health care provider or pharmacist if you have questions. COMMON BRAND NAME(S): Alimta What should I tell my health care provider before I take this medicine? They need to know if you have any of these conditions: -infection (especially a virus infection such as chickenpox, cold sores, or herpes) -kidney disease -low blood counts, like low white cell, platelet, or red cell counts -lung or breathing disease, like asthma -radiation therapy -an unusual or allergic reaction to pemetrexed, other medicines, foods, dyes, or preservative -pregnant or trying to get pregnant -breast-feeding How should I use this medicine? This drug is given as an infusion into a vein. It is administered in a hospital or clinic by a specially trained health care professional. Talk to your pediatrician regarding the use of this medicine in children. Special care may be needed. Overdosage: If you think you have taken too much of this medicine contact a poison control center or emergency room at once. NOTE: This medicine is only for you. Do not share this medicine with others. What if I miss a dose? It is important not to miss your dose. Call your doctor or health care professional if you are unable to keep an appointment. What may interact with this medicine? This medicine may interact with the following medications: -Ibuprofen This list may not describe all possible interactions. Give your health care provider a list of all the medicines, herbs, non-prescription drugs, or dietary supplements you use. Also tell them if you smoke,  drink alcohol, or use illegal drugs. Some items may interact with your medicine. What should I watch for while using this medicine? Visit your doctor for checks on your progress. This drug may make you feel generally unwell. This is not uncommon, as chemotherapy can affect healthy cells as well as cancer cells. Report any side effects. Continue your course  of treatment even though you feel ill unless your doctor tells you to stop. In some cases, you may be given additional medicines to help with side effects. Follow all directions for their use. Call your doctor or health care professional for advice if you get a fever, chills or sore throat, or other symptoms of a cold or flu. Do not treat yourself. This drug decreases your body's ability to fight infections. Try to avoid being around people who are sick. This medicine may increase your risk to bruise or bleed. Call your doctor or health care professional if you notice any unusual bleeding. Be careful brushing and flossing your teeth or using a toothpick because you may get an infection or bleed more easily. If you have any dental work done, tell your dentist you are receiving this medicine. Avoid taking products that contain aspirin, acetaminophen, ibuprofen, naproxen, or ketoprofen unless instructed by your doctor. These medicines may hide a fever. Call your doctor or health care professional if you get diarrhea or mouth sores. Do not treat yourself. To protect your kidneys, drink water or other fluids as directed while you are taking this medicine. Do not become pregnant while taking this medicine or for 6 months after stopping it. Women should inform their doctor if they wish to become pregnant or think they might be pregnant. Men should not father a child while taking this medicine and for 3 months after stopping it. This may interfere with the ability to father a child. You should talk to your doctor or health care professional if you are concerned about your fertility. There is a potential for serious side effects to an unborn child. Talk to your health care professional or pharmacist for more information. Do not breast-feed an infant while taking this medicine or for 1 week after stopping it. What side effects may I notice from receiving this medicine? Side effects that you should report to your  doctor or health care professional as soon as possible: -allergic reactions like skin rash, itching or hives, swelling of the face, lips, or tongue -breathing problems -redness, blistering, peeling or loosening of the skin, including inside the mouth -signs and symptoms of bleeding such as bloody or black, tarry stools; red or dark-brown urine; spitting up blood or brown material that looks like coffee grounds; red spots on the skin; unusual bruising or bleeding from the eye, gums, or nose -signs and symptoms of infection like fever or chills; cough; sore throat; pain or trouble passing urine -signs and symptoms of kidney injury like trouble passing urine or change in the amount of urine -signs and symptoms of liver injury like dark yellow or brown urine; general ill feeling or flu-like symptoms; light-colored stools; loss of appetite; nausea; right upper belly pain; unusually weak or tired; yellowing of the eyes or skin Side effects that usually do not require medical attention (report to your doctor or health care professional if they continue or are bothersome): -constipation -dizziness -mouth sores -nausea, vomiting -pain, tingling, numbness in the hands or feet -unusually weak or tired This list may not describe all possible side effects. Call your doctor for medical  advice about side effects. You may report side effects to FDA at 1-800-FDA-1088. Where should I keep my medicine? This drug is given in a hospital or clinic and will not be stored at home. NOTE: This sheet is a summary. It may not cover all possible information. If you have questions about this medicine, talk to your doctor, pharmacist, or health care provider.  2018 Elsevier/Gold Standard (2016-01-13 18:51:46)

## 2017-08-18 ENCOUNTER — Telehealth: Payer: Self-pay | Admitting: *Deleted

## 2017-08-18 NOTE — Telephone Encounter (Signed)
Patient came to the outpatient c.ctr in cancer center in [place] to discuss his care. He asked to speak to me. Shirlean Mylar contacted me in [place] and I was able to answer the patient's questions and concerns  Patient's next apt is on 6/4. Dr. Rogue Bussing will not be giving him any treatment on this day. Pt wanted to know if he needed to take his Decadron on this day. I explained to him that since he will not be having any treatment, he did need to take any decadron. Teach back process was performed with patient for these instructions. Pt gave verbal understanding of the plan of care.

## 2017-08-18 NOTE — Telephone Encounter (Signed)
-----   Message from Secundino Ginger sent at 08/18/2017  8:03 AM EDT ----- Regarding: pills Contact: [phone] Nathanuel called and said he didn't know if he was supposed to take all his pills. I don't know what he is talking about so if one of you will please cal him.

## 2017-08-30 ENCOUNTER — Inpatient Hospital Stay: Payer: Medicare Other

## 2017-08-30 ENCOUNTER — Inpatient Hospital Stay: Payer: Medicare Other | Attending: Internal Medicine | Admitting: Internal Medicine

## 2017-08-30 VITALS — BP 185/88 | HR 66 | Temp 97.8°F | Resp 20 | Ht 72.0 in | Wt 202.8 lb

## 2017-08-30 DIAGNOSIS — Z79899 Other long term (current) drug therapy: Secondary | ICD-10-CM | POA: Diagnosis not present

## 2017-08-30 DIAGNOSIS — I129 Hypertensive chronic kidney disease with stage 1 through stage 4 chronic kidney disease, or unspecified chronic kidney disease: Secondary | ICD-10-CM | POA: Insufficient documentation

## 2017-08-30 DIAGNOSIS — E785 Hyperlipidemia, unspecified: Secondary | ICD-10-CM | POA: Diagnosis not present

## 2017-08-30 DIAGNOSIS — R070 Pain in throat: Secondary | ICD-10-CM | POA: Insufficient documentation

## 2017-08-30 DIAGNOSIS — C3411 Malignant neoplasm of upper lobe, right bronchus or lung: Secondary | ICD-10-CM | POA: Diagnosis not present

## 2017-08-30 DIAGNOSIS — Z87891 Personal history of nicotine dependence: Secondary | ICD-10-CM | POA: Diagnosis not present

## 2017-08-30 DIAGNOSIS — Z7982 Long term (current) use of aspirin: Secondary | ICD-10-CM | POA: Diagnosis not present

## 2017-08-30 DIAGNOSIS — N400 Enlarged prostate without lower urinary tract symptoms: Secondary | ICD-10-CM | POA: Insufficient documentation

## 2017-08-30 DIAGNOSIS — R5381 Other malaise: Secondary | ICD-10-CM | POA: Diagnosis not present

## 2017-08-30 DIAGNOSIS — I739 Peripheral vascular disease, unspecified: Secondary | ICD-10-CM | POA: Diagnosis not present

## 2017-08-30 DIAGNOSIS — I252 Old myocardial infarction: Secondary | ICD-10-CM | POA: Insufficient documentation

## 2017-08-30 DIAGNOSIS — N189 Chronic kidney disease, unspecified: Secondary | ICD-10-CM

## 2017-08-30 DIAGNOSIS — K219 Gastro-esophageal reflux disease without esophagitis: Secondary | ICD-10-CM | POA: Diagnosis not present

## 2017-08-30 DIAGNOSIS — R5383 Other fatigue: Secondary | ICD-10-CM | POA: Diagnosis not present

## 2017-08-30 LAB — CBC WITH DIFFERENTIAL/PLATELET
BASOS ABS: 0.1 10*3/uL (ref 0–0.1)
Basophils Relative: 1 %
EOS PCT: 6 %
Eosinophils Absolute: 0.3 10*3/uL (ref 0–0.7)
HEMATOCRIT: 34.3 % — AB (ref 40.0–52.0)
Hemoglobin: 11.5 g/dL — ABNORMAL LOW (ref 13.0–18.0)
LYMPHS ABS: 1.4 10*3/uL (ref 1.0–3.6)
Lymphocytes Relative: 28 %
MCH: 28.7 pg (ref 26.0–34.0)
MCHC: 33.5 g/dL (ref 32.0–36.0)
MCV: 85.6 fL (ref 80.0–100.0)
MONO ABS: 1.2 10*3/uL — AB (ref 0.2–1.0)
MONOS PCT: 24 %
NEUTROS ABS: 2.1 10*3/uL (ref 1.4–6.5)
Neutrophils Relative %: 41 %
PLATELETS: 181 10*3/uL (ref 150–440)
RBC: 4 MIL/uL — ABNORMAL LOW (ref 4.40–5.90)
RDW: 18.3 % — AB (ref 11.5–14.5)
WBC: 5.2 10*3/uL (ref 3.8–10.6)

## 2017-08-30 LAB — BASIC METABOLIC PANEL
ANION GAP: 10 (ref 5–15)
BUN: 15 mg/dL (ref 6–20)
CHLORIDE: 101 mmol/L (ref 101–111)
CO2: 27 mmol/L (ref 22–32)
Calcium: 8.7 mg/dL — ABNORMAL LOW (ref 8.9–10.3)
Creatinine, Ser: 1.01 mg/dL (ref 0.61–1.24)
GFR calc Af Amer: >60 mL/min (ref >60)
GFR calc non Af Amer: >60 mL/min (ref >60)
GLUCOSE: 106 mg/dL — AB (ref 65–99)
POTASSIUM: 3.4 mmol/L — AB (ref 3.5–5.1)
Sodium: 138 mmol/L (ref 135–145)

## 2017-08-30 NOTE — Assessment & Plan Note (Addendum)
#  Adenocarcinoma stage II [T3N0] ; right upper lobe lung nodule 19 mm lung nodule to be 21.  S/p 4 cycles of carbo-alimta.  May 2019 CT scan- PR.  We reviewed the tumor conference regarding further planning for surgery; currently feasible; will refer back to Dr.Oaks.   # HTN-poorly controlled; re-start  HCTZ/ amlodipine.   # follow up in 6 weeks/labs; port flush. Follow up with Dr.Oaks.   # I reviewed the blood work- with the patient in detail; also reviewed the imaging independently [as summarized above]; and with the patient in detail.

## 2017-08-30 NOTE — Progress Notes (Signed)
[place] OFFICE PROGRESS NOTE  Patient Care Team: Leonel Ramsay, MD as PCP - General (Infectious Diseases) Telford Nab, RN as Registered Nurse  Cancer Staging No matching staging information was found for the patient.   Oncology History   #  RUL Adeno Ca-~ 19 mm in size  with SUV of 21 [LDCT];- ADENOCARCINOMA, ACINAR AND SOLID PATTERN. Stage I [T1No] vs stage II [T3N0]  # march 19th- CARBO-ALIMTA q 3 w  # 2019 Feb Liver lesion/uptake on PET - incidental- MRI liver -NEG  DIAGNOSIS: [ MARCH 2019]  STAGE:  II  ;GOALS: curative  CURRENT/MOST RECENT THERAPY [ MARCH 2019] Neo-Adj carbo-alimta      Primary cancer of right upper lobe of lung ([place])      INTERVAL HISTORY:  Ruben Diaz 72 y.o.  male pleasant patient above history of stage II adenocarcinoma the lung is here status post 4 cycles of carbo Alimta is here for follow-up.  Patient complains of mild sore throat.  Overall improved.  Complains of mild fatigue.  Otherwise his appetite improved.  No nausea vomiting.  No skin rash.  Review of Systems  Constitutional: Positive for malaise/fatigue. Negative for chills, diaphoresis, fever and weight loss.  HENT: Positive for sore throat. Negative for nosebleeds.   Eyes: Negative for double vision.  Respiratory: Negative for cough, hemoptysis, sputum production, shortness of breath and wheezing.   Cardiovascular: Negative for chest pain, palpitations, orthopnea and leg swelling.  Gastrointestinal: Negative for abdominal pain, blood in stool, constipation, diarrhea, heartburn, melena, nausea and vomiting.  Genitourinary: Negative for dysuria, frequency and urgency.  Musculoskeletal: Negative for back pain and joint pain.  Skin: Negative.  Negative for itching and rash.  Neurological: Negative for dizziness, tingling, focal weakness, weakness and headaches.  Endo/Heme/Allergies: Does not bruise/bleed easily.  Psychiatric/Behavioral: Negative for  depression. The patient is not nervous/anxious and does not have insomnia.       PAST MEDICAL HISTORY :  Past Medical History:  Diagnosis Date  . Cancer ([place])   . Chronic kidney disease   . Dysphagia   . GERD (gastroesophageal reflux disease)   . History of BPH   . Hyperlipidemia   . Hypertension   . Myocardial infarction ([place])   . Peripheral vascular disease ([place])     PAST SURGICAL HISTORY :   Past Surgical History:  Procedure Laterality Date  . APPENDECTOMY    . COLONOSCOPY WITH PROPOFOL N/A 09/16/2016   Procedure: COLONOSCOPY WITH PROPOFOL;  Surgeon: [name], MD;  Location[facility] ENDOSCOPY;  Service: Endoscopy;  Laterality: N/A;  . ESOPHAGOGASTRODUODENOSCOPY (EGD) WITH PROPOFOL N/A 09/16/2016   Procedure: ESOPHAGOGASTRODUODENOSCOPY (EGD) WITH PROPOFOL;  Surgeon: [name], MD;  Location: [facility] ENDOSCOPY;  Service: Endoscopy;  Laterality: N/A;  . ESOPHAGOGASTRODUODENOSCOPY (EGD) WITH PROPOFOL N/A 12/16/2016   Procedure: ESOPHAGOGASTRODUODENOSCOPY (EGD) WITH PROPOFOL;  Surgeon: [name], MD;  Location: [facility] ENDOSCOPY;  Service: Endoscopy;  Laterality: N/A;  . STOMACH SURGERY      FAMILY HISTORY :  No family history on file.  SOCIAL HISTORY:   Social History   Tobacco Use  . Smoking status: Former Smoker    Packs/day: 0.70    Years: 51.00    Pack years: 35.70    Last attempt to quit: 2013    Years since quitting: 6.4  . Smokeless tobacco: Never Used  Substance Use Topics  . Alcohol use: No  . Drug use: No    ALLERGIES:  is allergic to sucralfate.  MEDICATIONS:  Current Outpatient Medications  Medication Sig Dispense Refill  . amLODipine (NORVASC) 10 MG tablet Take 10 mg by mouth daily.    Marland Kitchen aspirin EC 81 MG tablet Take 81 mg by mouth daily.    Marland Kitchen atorvastatin (LIPITOR) 10 MG tablet Take 10 mg by mouth daily.    . folic acid (FOLVITE) 1 MG tablet Take 1 tablet (1 mg total) by mouth daily. 90 tablet 1  . hydrochlorothiazide (HYDRODIURIL)  25 MG tablet Take 25 mg by mouth daily.    . magic mouthwash SOLN Take 5 mLs by mouth 4 (four) times daily as needed for mouth pain. Swish & swallow 240 mL 3  . pantoprazole (PROTONIX) 40 MG tablet Take 1 tablet by mouth daily.    Marland Kitchen dexamethasone (DECADRON) 4 MG tablet Take one pill AM & PM x 3 days; start the day prior to chemo. (Patient not taking: Reported on 08/30/2017) 30 tablet 0  . ondansetron (ZOFRAN) 8 MG tablet One pill every 8 hours as needed for nausea/vomitting. (Patient not taking: Reported on 08/02/2017) 40 tablet 1  . prochlorperazine (COMPAZINE) 10 MG tablet Take 1 tablet (10 mg total) by mouth every 6 (six) hours as needed for nausea or vomiting. (Patient not taking: Reported on 08/16/2017) 40 tablet 1  . sucralfate (CARAFATE) 1 g tablet Take 1 g by mouth 4 (four) times daily -  with meals and at bedtime.      No current facility-administered medications for this visit.     PHYSICAL EXAMINATION: ECOG PERFORMANCE STATUS: 1 - Symptomatic but completely ambulatory  BP (!) 185/88 (Patient Position: Sitting)   Pulse 66   Temp 97.8 F (36.6 C) (Tympanic)   Resp 20   Ht 6' (1.829 m)   Wt 202 lb 13.2 oz (92 kg)   BMI 27.51 kg/m   Filed Weights   08/30/17 0922  Weight: 202 lb 13.2 oz (92 kg)    GENERAL: Well-nourished well-developed; Alert, no distress and comfortable.  He is alone. EYES: no pallor or icterus OROPHARYNX: no thrush or ulceration; NECK: supple; no lymph nodes felt. LYMPH:  no palpable lymphadenopathy in the axillary or inguinal regions LUNGS: Decreased breath sounds auscultation bilaterally. No wheeze or crackles HEART/CVS: regular rate & rhythm and no murmurs; No lower extremity edema ABDOMEN:abdomen soft, non-tender and normal bowel sounds. No hepatomegaly or splenomegaly.  Musculoskeletal:no cyanosis of digits and no clubbing  PSYCH: alert & oriented x 3 with fluent speech NEURO: no focal motor/sensory deficits SKIN:  no rashes or significant  lesions    LABORATORY DATA:  I have reviewed the data as listed    Component Value Date/Time   NA 138 08/30/2017 0856   NA 140 11/14/2013 2211   K 3.4 (L) 08/30/2017 0856   K 3.6 11/14/2013 2211   CL 101 08/30/2017 0856   CL 103 11/14/2013 2211   CO2 27 08/30/2017 0856   CO2 28 11/14/2013 2211   GLUCOSE 106 (H) 08/30/2017 0856   GLUCOSE 110 (H) 11/14/2013 2211   BUN 15 08/30/2017 0856   BUN 25 (H) 11/14/2013 2211   CREATININE 1.01 08/30/2017 0856   CREATININE 1.24 11/14/2013 2211   CALCIUM 8.7 (L) 08/30/2017 0856   CALCIUM 9.0 11/14/2013 2211   PROT 7.9 08/16/2017 0929   ALBUMIN 3.3 (L) 08/16/2017 0929   AST 26 08/16/2017 0929   ALT 10 (L) 08/16/2017 0929   ALKPHOS 59 08/16/2017 0929   BILITOT 0.4 08/16/2017 0929   GFRNONAA >60 08/30/2017 6834  GFRNONAA 59 (L) 11/14/2013 2211   GFRAA >60 08/30/2017 0856   GFRAA >60 11/14/2013 2211    No results found for: SPEP, UPEP  Lab Results  Component Value Date   WBC 5.2 08/30/2017   NEUTROABS 2.1 08/30/2017   HGB 11.5 (L) 08/30/2017   HCT 34.3 (L) 08/30/2017   MCV 85.6 08/30/2017   PLT 181 08/30/2017      Chemistry      Component Value Date/Time   NA 138 08/30/2017 0856   NA 140 11/14/2013 2211   K 3.4 (L) 08/30/2017 0856   K 3.6 11/14/2013 2211   CL 101 08/30/2017 0856   CL 103 11/14/2013 2211   CO2 27 08/30/2017 0856   CO2 28 11/14/2013 2211   BUN 15 08/30/2017 0856   BUN 25 (H) 11/14/2013 2211   CREATININE 1.01 08/30/2017 0856   CREATININE 1.24 11/14/2013 2211      Component Value Date/Time   CALCIUM 8.7 (L) 08/30/2017 0856   CALCIUM 9.0 11/14/2013 2211   ALKPHOS 59 08/16/2017 0929   AST 26 08/16/2017 0929   ALT 10 (L) 08/16/2017 0929   BILITOT 0.4 08/16/2017 0929       RADIOGRAPHIC STUDIES: I have personally reviewed the radiological images as listed and agreed with the findings in the report. No results found.   ASSESSMENT & PLAN:  Primary cancer of right upper lobe of lung  ([place]) #Adenocarcinoma stage II [T3N0] ; right upper lobe lung nodule 19 mm lung nodule to be 21.  S/p 4 cycles of carbo-alimta.  May 2019 CT scan- PR.  We reviewed the tumor conference regarding further planning for surgery; currently feasible; will refer back to Dr.Oaks.   # HTN-poorly controlled; re-start  HCTZ/ amlodipine.   # follow up in 6 weeks/labs; port flush. Follow up with Dr.Oaks.   # I reviewed the blood work- with the patient in detail; also reviewed the imaging independently [as summarized above]; and with the patient in detail.     Orders Placed This Encounter  Procedures  . Ambulatory referral to Cardiothoracic Surgery    Referral Priority:   Routine    Referral Type:   Surgical    Referral Reason:   Specialty Services Required    Referred to Provider:   [name], MD    Requested Specialty:   Cardiothoracic Surgery    Number of Visits Requested:   1   All questions were answered. The patient knows to call the clinic with any problems, questions or concerns.      Cammie Sickle, MD 08/31/2017 10:54 PM

## 2017-08-30 NOTE — Patient Instructions (Signed)
#  Start taking your blood pressure medications-hydrochlorothiazide once a day; and also Norvasc once a day.

## 2017-09-09 ENCOUNTER — Ambulatory Visit: Payer: Self-pay | Admitting: Cardiothoracic Surgery

## 2017-09-13 ENCOUNTER — Telehealth: Payer: Self-pay | Admitting: Cardiothoracic Surgery

## 2017-09-13 NOTE — Telephone Encounter (Signed)
Left a message for the patient to call the office, patient no showed an appointment with Dr. Genevive Bi on 09/09/17. Please r/s if patient calls back.

## 2017-09-23 ENCOUNTER — Ambulatory Visit (INDEPENDENT_AMBULATORY_CARE_PROVIDER_SITE_OTHER): Payer: Medicare Other | Admitting: Cardiothoracic Surgery

## 2017-09-23 ENCOUNTER — Encounter: Payer: Self-pay | Admitting: Cardiothoracic Surgery

## 2017-09-23 VITALS — BP 127/70 | HR 79 | Temp 98.2°F | Resp 18 | Ht 72.0 in | Wt 204.4 lb

## 2017-09-23 DIAGNOSIS — J984 Other disorders of lung: Secondary | ICD-10-CM

## 2017-09-23 NOTE — Progress Notes (Signed)
  Patient ID: Ruben Diaz, male   DOB: 1946-01-29, 72 y.o.   MRN: [id number]  HISTORY: This patient returns today in follow-up.  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  There is some questionable chest wall invasion and he therefore underwent chemotherapy.  A repeat CT of the chest confirmed that the lesion had gotten slightly smaller and he comes in today to discuss the option of surgery.  He had some pulmonary function studies made at the beginning of the year and they reveal an FEV1 and a DLCO of 60%.  The patient has not smoked in over 10 years.  He states that he does get short of breath and that when he does so he just slows way down but does not stop walking.  He is by himself today.  His daughter who usually comes with him to appointments could not make it.   Vitals:   09/23/17 1037  BP: 127/70  Pulse: 79  Resp: 18  Temp: 98.2 F (36.8 C)  SpO2: 96%     EXAM:    Resp: Lungs are clear bilaterally.  No respiratory distress, normal effort. Heart:  Regular without murmurs Abd:  Abdomen is soft, non distended and non tender. No masses are palpable.  There is no rebound and no guarding.  Neurological: Alert and oriented to person, place, and time. Coordination normal.  Skin: Skin is warm and dry. No rash noted. No diaphoretic. No erythema. No pallor.  Psychiatric: Normal mood and affect. Normal behavior. Judgment and thought content normal.    ASSESSMENT: I had a long discussion with him regarding the options.  We again reviewed his CT scans and his pulmonary function studies.  I explained to him the indications and risks of right thoracotomy and right upper lobectomy.  He understands that this is the standard of care but that radiation therapy is offered for patients who cannot or will not have surgery.  I reviewed with him the benefits and advantages and disadvantages of all the forms of treatment and he would like to  meet with Dr. Donella Stade to review the option of radiation.   PLAN:   After an extensive discussion with the patient he has declined surgical intervention.  He would like to meet with Dr. Donella Stade to discuss the possibility of undergoing radiation therapy.  I explained to him that we would set up that appointment and that if he had any further questions he could contact our office.    Ruben Lewandowsky, MD

## 2017-09-23 NOTE — Patient Instructions (Signed)
We will send the referral to Dr.Crystal's office for you to discuss Radiation therapy. Someone will call you within 5 days to schedule an appointment.   Please call our office if you have any questions or concerns.

## 2017-10-10 ENCOUNTER — Encounter: Payer: Self-pay | Admitting: Radiation Oncology

## 2017-10-10 ENCOUNTER — Ambulatory Visit
Admission: RE | Admit: 2017-10-10 | Discharge: 2017-10-10 | Disposition: A | Discharge status: Home / Self Care | Payer: Medicare Other | Source: Ambulatory Visit | Attending: Radiation Oncology | Admitting: Radiation Oncology

## 2017-10-10 ENCOUNTER — Other Ambulatory Visit: Payer: Self-pay

## 2017-10-10 VITALS — BP 128/67 | HR 52 | Temp 96.2°F | Resp 20 | Wt 206.6 lb

## 2017-10-10 DIAGNOSIS — I129 Hypertensive chronic kidney disease with stage 1 through stage 4 chronic kidney disease, or unspecified chronic kidney disease: Secondary | ICD-10-CM | POA: Insufficient documentation

## 2017-10-10 DIAGNOSIS — N189 Chronic kidney disease, unspecified: Secondary | ICD-10-CM | POA: Diagnosis not present

## 2017-10-10 DIAGNOSIS — Z9221 Personal history of antineoplastic chemotherapy: Secondary | ICD-10-CM | POA: Insufficient documentation

## 2017-10-10 DIAGNOSIS — R1310 Dysphagia, unspecified: Secondary | ICD-10-CM | POA: Insufficient documentation

## 2017-10-10 DIAGNOSIS — Z79899 Other long term (current) drug therapy: Secondary | ICD-10-CM | POA: Insufficient documentation

## 2017-10-10 DIAGNOSIS — Z87891 Personal history of nicotine dependence: Secondary | ICD-10-CM | POA: Diagnosis not present

## 2017-10-10 DIAGNOSIS — Z7982 Long term (current) use of aspirin: Secondary | ICD-10-CM | POA: Diagnosis not present

## 2017-10-10 DIAGNOSIS — I739 Peripheral vascular disease, unspecified: Secondary | ICD-10-CM | POA: Insufficient documentation

## 2017-10-10 DIAGNOSIS — C3411 Malignant neoplasm of upper lobe, right bronchus or lung: Secondary | ICD-10-CM

## 2017-10-10 DIAGNOSIS — I252 Old myocardial infarction: Secondary | ICD-10-CM | POA: Diagnosis not present

## 2017-10-10 DIAGNOSIS — E785 Hyperlipidemia, unspecified: Secondary | ICD-10-CM | POA: Diagnosis not present

## 2017-10-10 DIAGNOSIS — K219 Gastro-esophageal reflux disease without esophagitis: Secondary | ICD-10-CM | POA: Insufficient documentation

## 2017-10-10 NOTE — Consult Note (Signed)
NEW PATIENT EVALUATION  Name: Ruben Diaz  MRN: [id number]  Date:   10/10/2017     DOB: [birth year]   This 72 y.o. male patient presents to the clinic for initial evaluation of stage I adenocarcinoma of the right upper lobe.  REFERRING PHYSICIAN: [name], MD  CHIEF COMPLAINT:  Chief Complaint  Patient presents with  . Lung Cancer    Pt is here for initial consultation of lung cancer.      DIAGNOSIS: The encounter diagnosis was Primary cancer of right upper lobe of lung ([place]).   PREVIOUS INVESTIGATIONS:  Pathology reports reviewed Clinical notes reviewed CT scans and PET/CT scan reviewed  HPI: patient is a 72 year old male who was found to have a right upper lobe mass abutting the pleura him back in February 2019.patient underwent neoadjuvant chemotherapy with Alimta and carboplatin every 3 weeks.PET CT scan demonstrated microliter hypermetabolic 1.9 mm right upper lobe pulmonary lesion consistent with primary lung cancer no enlarged or hypermetabolic mediastinal or hilar nodes.Dr. Faith Rogue has been involved in the management. After 4 cycles repeat CT scans chest confirm the lesion had gotten slightly smaller. Patient is an FEV1 and DLCO of 60%. Surgery was offered although the patient has declined.Patient is doing fairly well specifically denies cough hemoptysis or chest tightness he is now referred to radiation oncology for opinion.  PLANNED TREATMENT REGIMEN: SB RT  PAST MEDICAL HISTORY:  has a past medical history of Cancer ([place]), Chronic kidney disease, Dysphagia, GERD (gastroesophageal reflux disease), History of BPH, Hyperlipidemia, Hypertension, Myocardial infarction ([place]), and Peripheral vascular disease ([place]).    PAST SURGICAL HISTORY:  Past Surgical History:  Procedure Laterality Date  . APPENDECTOMY    . COLONOSCOPY WITH PROPOFOL N/A 09/16/2016   Procedure: COLONOSCOPY WITH PROPOFOL;  Surgeon: [name], MD;  [facility] END[facility]COPY;  Service:  Endoscopy;  Laterality: N/A;  . ESOPHAGOGASTRODUODENOSCOPY (EGD) WITH PROPOFOL N/A 09/16/2016   Procedure: ESOPHAGOGASTRODUODENOSCOPY (EGD) WITH PROPOFOL;  Surgeon: [name], MD;  Location: [facility] ENDOSCOPY;  Service: Endoscopy;  Laterality: N/A;  . ESOPHAGOGASTRODUODENOSCOPY (EGD) WITH PROPOFOL N/A 12/16/2016   Procedure: ESOPHAGOGASTRODUODENOSCOPY (EGD) WITH PROPOFOL;  Surgeon: [name], MD;  Location: [facility] ENDOSCOPY;  Service: Endoscopy;  Laterality: N/A;  . STOMACH SURGERY      FAMILY HISTORY: family history is not on file.  SOCIAL HISTORY:  reports that he quit smoking about 6 years ago. He has a 35.70 pack-year smoking history. He has never used smokeless tobacco. He reports that he does not drink alcohol or use drugs.  ALLERGIES: Sucralfate  MEDICATIONS:  Current Outpatient Medications  Medication Sig Dispense Refill  . amLODipine (NORVASC) 10 MG tablet Take 10 mg by mouth daily.    Marland Kitchen aspirin EC 81 MG tablet Take 81 mg by mouth daily.    Marland Kitchen atorvastatin (LIPITOR) 10 MG tablet Take 10 mg by mouth daily.    Marland Kitchen dexamethasone (DECADRON) 4 MG tablet Take one pill AM & PM x 3 days; start the day prior to chemo. 30 tablet 0  . folic acid (FOLVITE) 1 MG tablet Take 1 tablet (1 mg total) by mouth daily. 90 tablet 1  . hydrochlorothiazide (HYDRODIURIL) 25 MG tablet Take 25 mg by mouth daily.    . magic mouthwash SOLN Take 5 mLs by mouth 4 (four) times daily as needed for mouth pain. Swish & swallow 240 mL 3  . pantoprazole (PROTONIX) 40 MG tablet Take 1 tablet by mouth daily.    . sucralfate (CARAFATE) 1 g  tablet Take 1 g by mouth 4 (four) times daily -  with meals and at bedtime.      No current facility-administered medications for this encounter.     ECOG PERFORMANCE STATUS:  0 - Asymptomatic  REVIEW OF SYSTEMS:  Patient denies any weight loss, fatigue, weakness, fever, chills or night sweats. Patient denies any loss of vision, blurred vision. Patient denies any ringing   of the ears or hearing loss. No irregular heartbeat. Patient denies heart murmur or history of fainting. Patient denies any chest pain or pain radiating to her upper extremities. Patient denies any shortness of breath, difficulty breathing at night, cough or hemoptysis. Patient denies any swelling in the lower legs. Patient denies any nausea vomiting, vomiting of blood, or coffee ground material in the vomitus. Patient denies any stomach pain. Patient states has had normal bowel movements no significant constipation or diarrhea. Patient denies any dysuria, hematuria or significant nocturia. Patient denies any problems walking, swelling in the joints or loss of balance. Patient denies any skin changes, loss of hair or loss of weight. Patient denies any excessive worrying or anxiety or significant depression. Patient denies any problems with insomnia. Patient denies excessive thirst, polyuria, polydipsia. Patient denies any swollen glands, patient denies easy bruising or easy bleeding. Patient denies any recent infections, allergies or URI. Patient "s visual fields have not changed significantly in recent time.    PHYSICAL EXAM: BP 128/67   Pulse (!) 52   Temp (!) 96.2 F (35.7 C)   Resp 20   Wt 206 lb 9.1 oz (93.7 kg)   BMI 28.02 kg/m  Well-developed well-nourished patient in NAD. HEENT reveals PERLA, EOMI, discs not visualized.  Oral cavity is clear. No oral mucosal lesions are identified. Neck is clear without evidence of cervical or supraclavicular adenopathy. Lungs are clear to A&P. Cardiac examination is essentially unremarkable with regular rate and rhythm without murmur rub or thrill. Abdomen is benign with no organomegaly or masses noted. Motor sensory and DTR levels are equal and symmetric in the upper and lower extremities. Cranial nerves II through XII are grossly intact. Proprioception is intact. No peripheral adenopathy or edema is identified. No motor or sensory levels are noted. Crude  visual fields are within normal range.  LABORATORY DATA: pathology reports reviewed    RADIOLOGY RESULTS:CT scans and PET/CT scans reviewed   IMPRESSION: stage I adenocarcinoma the right upper lobe status post neoadjuvant chemotherapy in 72 year old male  PLAN: present time I believe patient would be an excellent candidate for SB RT. Would plan on delivering 6000 cGy in 5 fractions. I personally set up and scheduled CT simulation with 4D treatment planning as well as PET CT fusion study.There will be extra effort by both professional staff as well as technical staff to coordinate and manage concurrent chemoradiation and ensuing side effects during his treatments.  Risks and benefits of treatment including possible fatigue skin reaction pleural pain secondary to the close approximation to his chest wall all were discussed in detail with the patient. Patient seems to compress my treatment plan well.  I would like to take this opportunity to thank you for allowing me to participate in the care of your patient.Noreene Filbert, MD

## 2017-10-11 ENCOUNTER — Inpatient Hospital Stay (HOSPITAL_BASED_OUTPATIENT_CLINIC_OR_DEPARTMENT_OTHER): Payer: Medicare Other | Admitting: Internal Medicine

## 2017-10-11 ENCOUNTER — Inpatient Hospital Stay: Payer: Medicare Other | Attending: Internal Medicine

## 2017-10-11 ENCOUNTER — Encounter: Payer: Self-pay | Admitting: Internal Medicine

## 2017-10-11 ENCOUNTER — Other Ambulatory Visit: Payer: Self-pay

## 2017-10-11 VITALS — BP 122/65 | HR 78 | Temp 97.0°F | Resp 20 | Ht 72.0 in | Wt 205.0 lb

## 2017-10-11 DIAGNOSIS — K219 Gastro-esophageal reflux disease without esophagitis: Secondary | ICD-10-CM

## 2017-10-11 DIAGNOSIS — I252 Old myocardial infarction: Secondary | ICD-10-CM | POA: Diagnosis not present

## 2017-10-11 DIAGNOSIS — N189 Chronic kidney disease, unspecified: Secondary | ICD-10-CM | POA: Insufficient documentation

## 2017-10-11 DIAGNOSIS — C3411 Malignant neoplasm of upper lobe, right bronchus or lung: Secondary | ICD-10-CM

## 2017-10-11 DIAGNOSIS — R5381 Other malaise: Secondary | ICD-10-CM | POA: Diagnosis not present

## 2017-10-11 DIAGNOSIS — E785 Hyperlipidemia, unspecified: Secondary | ICD-10-CM | POA: Insufficient documentation

## 2017-10-11 DIAGNOSIS — R5383 Other fatigue: Secondary | ICD-10-CM | POA: Insufficient documentation

## 2017-10-11 DIAGNOSIS — Z7982 Long term (current) use of aspirin: Secondary | ICD-10-CM

## 2017-10-11 DIAGNOSIS — I739 Peripheral vascular disease, unspecified: Secondary | ICD-10-CM | POA: Insufficient documentation

## 2017-10-11 DIAGNOSIS — I129 Hypertensive chronic kidney disease with stage 1 through stage 4 chronic kidney disease, or unspecified chronic kidney disease: Secondary | ICD-10-CM | POA: Insufficient documentation

## 2017-10-11 DIAGNOSIS — N400 Enlarged prostate without lower urinary tract symptoms: Secondary | ICD-10-CM | POA: Insufficient documentation

## 2017-10-11 DIAGNOSIS — Z87891 Personal history of nicotine dependence: Secondary | ICD-10-CM

## 2017-10-11 DIAGNOSIS — R0602 Shortness of breath: Secondary | ICD-10-CM | POA: Insufficient documentation

## 2017-10-11 DIAGNOSIS — R05 Cough: Secondary | ICD-10-CM | POA: Diagnosis not present

## 2017-10-11 DIAGNOSIS — Z79899 Other long term (current) drug therapy: Secondary | ICD-10-CM | POA: Insufficient documentation

## 2017-10-11 LAB — COMPREHENSIVE METABOLIC PANEL
ALBUMIN: 3.6 g/dL (ref 3.5–5.0)
ALK PHOS: 58 U/L (ref 38–126)
ALT: 13 U/L (ref 0–44)
ANION GAP: 12 (ref 5–15)
AST: 24 U/L (ref 15–41)
BILIRUBIN TOTAL: 0.5 mg/dL (ref 0.3–1.2)
BUN: 20 mg/dL (ref 8–23)
CALCIUM: 9.4 mg/dL (ref 8.9–10.3)
CO2: 27 mmol/L (ref 22–32)
CREATININE: 1.17 mg/dL (ref 0.61–1.24)
Chloride: 99 mmol/L (ref 98–111)
GFR calc Af Amer: >60 mL/min (ref >60)
GFR calc non Af Amer: >60 mL/min (ref >60)
GLUCOSE: 108 mg/dL — AB (ref 70–99)
Potassium: 3.4 mmol/L — ABNORMAL LOW (ref 3.5–5.1)
SODIUM: 138 mmol/L (ref 135–145)
TOTAL PROTEIN: 8.2 g/dL — AB (ref 6.5–8.1)

## 2017-10-11 LAB — CBC WITH DIFFERENTIAL/PLATELET
BASOS PCT: 2 %
Basophils Absolute: 0.1 10*3/uL (ref 0–0.1)
Eosinophils Absolute: 0.2 10*3/uL (ref 0–0.7)
Eosinophils Relative: 5 %
HEMATOCRIT: 38.8 % — AB (ref 40.0–52.0)
HEMOGLOBIN: 12.8 g/dL — AB (ref 13.0–18.0)
LYMPHS ABS: 1.9 10*3/uL (ref 1.0–3.6)
Lymphocytes Relative: 42 %
MCH: 28.8 pg (ref 26.0–34.0)
MCHC: 33 g/dL (ref 32.0–36.0)
MCV: 87.3 fL (ref 80.0–100.0)
MONOS PCT: 12 %
Monocytes Absolute: 0.5 10*3/uL (ref 0.2–1.0)
NEUTROS PCT: 41 %
Neutro Abs: 1.9 10*3/uL (ref 1.4–6.5)
Platelets: 214 10*3/uL (ref 150–440)
RBC: 4.44 MIL/uL (ref 4.40–5.90)
RDW: 15.4 % — ABNORMAL HIGH (ref 11.5–14.5)
WBC: 4.6 10*3/uL (ref 3.8–10.6)

## 2017-10-11 NOTE — Progress Notes (Signed)
[place] OFFICE PROGRESS NOTE  Patient Care Team: Patient, No Pcp Per as PCP - General ([place]) Telford Nab, RN as Registered Nurse  Cancer Staging No matching staging information was found for the patient.   Oncology History   #  RUL Adeno Ca-~ 19 mm in size  with SUV of 21 [LDCT];- ADENOCARCINOMA, ACINAR AND SOLID PATTERN. Stage I [T1No] vs stage II [T3N0]  # march 19th- CARBO-ALIMTA q 3 wx4; PR; declined surgery; Aug 2019- SBRT  # 2019 Feb Liver lesion/uptake on PET - incidental- MRI liver -NEG  DIAGNOSIS: [ MARCH 2019]  STAGE:  II  ;GOALS: curative  CURRENT/MOST RECENT THERAPY [ MARCH 2019] Neo-Adj carbo-alimta x4; declined Surgery; pla SBRT      Primary cancer of right upper lobe of lung ([place])      INTERVAL HISTORY:  Ruben Diaz 72 y.o.  male pleasant patient above history of stage II adenocarcinoma the lung status post neoadjuvant chemotherapy is here for follow-up.  In the interim patient had a posttreatment CT scan that showed partial response.  Patient was evaluated by thoracic surgery; Recommended surgery however patient declined.  Patient then was evaluated by radiation oncology; thought to be a good candidate for SBRT.  He is awaiting simulation next week.  Patient has chronic mild shortness of breath/chronic mild cough.  Chronic mild fatigue.  Denies any worsening cough or hemoptysis.  Review of Systems  Constitutional: Positive for malaise/fatigue. Negative for chills, diaphoresis, fever and weight loss.  HENT: Negative for nosebleeds and sore throat.   Eyes: Negative for double vision.  Respiratory: Positive for cough and shortness of breath. Negative for hemoptysis, sputum production and wheezing.   Cardiovascular: Negative for chest pain, palpitations, orthopnea and leg swelling.  Gastrointestinal: Negative for abdominal pain, blood in stool, constipation, diarrhea, heartburn, melena, nausea and vomiting.   Genitourinary: Negative for dysuria, frequency and urgency.  Musculoskeletal: Negative for back pain and joint pain.  Skin: Negative.  Negative for itching and rash.  Neurological: Negative for dizziness, tingling, focal weakness, weakness and headaches.  Endo/Heme/Allergies: Does not bruise/bleed easily.  Psychiatric/Behavioral: Negative for depression. The patient is not nervous/anxious and does not have insomnia.       PAST MEDICAL HISTORY :  Past Medical History:  Diagnosis Date  . Cancer ([place])   . Chronic kidney disease   . Dysphagia   . GERD (gastroesophageal reflux disease)   . History of BPH   . Hyperlipidemia   . Hypertension   . Myocardial infarction ([place])   . Peripheral vascular disease ([place])     PAST SURGICAL HISTORY :   Past Surgical History:  Procedure Laterality Date  . APPENDECTOMY    . COLONOSCOPY WITH PROPOFOL N/A 09/16/2016   Procedure: COLONOSCOPY WITH PROPOFOL;  Surgeon: [name], MD;  Location: [facility] ENDOSCOPY;  Service: Endoscopy;  Laterality: N/A;  . ESOPHAGOGASTRODUODENOSCOPY (EGD) WITH PROPOFOL N/A 09/16/2016   Procedure: ESOPHAGOGASTRODUODENOSCOPY (EGD) WITH PROPOFOL;  Surgeon: [name], MD;  Location: [facility] ENDOSCOPY;  Service: Endoscopy;  Laterality: N/A;  . ESOPHAGOGASTRODUODENOSCOPY (EGD) WITH PROPOFOL N/A 12/16/2016   Procedure: ESOPHAGOGASTRODUODENOSCOPY (EGD) WITH PROPOFOL;  Surgeon: [name], MD;  Location: [facility] ENDOSCOPY;  Service: Endoscopy;  Laterality: N/A;  . STOMACH SURGERY      FAMILY HISTORY :  History reviewed. No pertinent family history.  SOCIAL HISTORY:   Social History   Tobacco Use  . Smoking status: Former Smoker    Packs/day: 0.70    Years:  51.00    Pack years: 35.70    Last attempt to quit: 2013    Years since quitting: 6.5  . Smokeless tobacco: Never Used  Substance Use Topics  . Alcohol use: No  . Drug use: No    ALLERGIES:  is allergic to sucralfate.  MEDICATIONS:  Current  Outpatient Medications  Medication Sig Dispense Refill  . amLODipine (NORVASC) 10 MG tablet Take 10 mg by mouth daily.    Marland Kitchen aspirin EC 81 MG tablet Take 81 mg by mouth daily.    Marland Kitchen atorvastatin (LIPITOR) 10 MG tablet Take 10 mg by mouth daily.    . folic acid (FOLVITE) 1 MG tablet Take 1 tablet (1 mg total) by mouth daily. 90 tablet 1  . hydrochlorothiazide (HYDRODIURIL) 25 MG tablet Take 25 mg by mouth daily.    . pantoprazole (PROTONIX) 40 MG tablet Take 1 tablet by mouth daily.    . sucralfate (CARAFATE) 1 g tablet Take 1 g by mouth 4 (four) times daily -  with meals and at bedtime.     . magic mouthwash SOLN Take 5 mLs by mouth 4 (four) times daily as needed for mouth pain. Swish & swallow (Patient not taking: Reported on 10/11/2017) 240 mL 3   No current facility-administered medications for this visit.     PHYSICAL EXAMINATION: ECOG PERFORMANCE STATUS: 0 - Asymptomatic  BP 122/65 (BP Location: Left Arm, Patient Position: Sitting)   Pulse 78   Temp (!) 97 F (36.1 C) (Tympanic)   Resp 20   Ht 6' (1.829 m)   Wt 205 lb 0.4 oz (93 kg)   BMI 27.81 kg/m   Filed Weights   10/11/17 0947  Weight: 205 lb 0.4 oz (93 kg)    GENERAL: Well-nourished well-developed; Alert, no distress and comfortable.  Alone. EYES: no pallor or icterus OROPHARYNX: no thrush or ulceration; NECK: supple; no lymph nodes felt. LYMPH:  no palpable lymphadenopathy in the axillary or inguinal regions LUNGS: Decreased breath sounds auscultation bilaterally. No wheeze or crackles HEART/CVS: regular rate & rhythm and no murmurs; No lower extremity edema ABDOMEN:abdomen soft, non-tender and normal bowel sounds. No hepatomegaly or splenomegaly.  Musculoskeletal:no cyanosis of digits and no clubbing  PSYCH: alert & oriented x 3 with fluent speech NEURO: no focal motor/sensory deficits SKIN:  no rashes or significant lesions    LABORATORY DATA:  I have reviewed the data as listed    Component Value Date/Time    NA 138 10/11/2017 0932   NA 140 11/14/2013 2211   K 3.4 (L) 10/11/2017 0932   K 3.6 11/14/2013 2211   CL 99 10/11/2017 0932   CL 103 11/14/2013 2211   CO2 27 10/11/2017 0932   CO2 28 11/14/2013 2211   GLUCOSE 108 (H) 10/11/2017 0932   GLUCOSE 110 (H) 11/14/2013 2211   BUN 20 10/11/2017 0932   BUN 25 (H) 11/14/2013 2211   CREATININE 1.17 10/11/2017 0932   CREATININE 1.24 11/14/2013 2211   CALCIUM 9.4 10/11/2017 0932   CALCIUM 9.0 11/14/2013 2211   PROT 8.2 (H) 10/11/2017 0932   ALBUMIN 3.6 10/11/2017 0932   AST 24 10/11/2017 0932   ALT 13 10/11/2017 0932   ALKPHOS 58 10/11/2017 0932   BILITOT 0.5 10/11/2017 0932   GFRNONAA >60 10/11/2017 0932   GFRNONAA 59 (L) 11/14/2013 2211   GFRAA >60 10/11/2017 0932   GFRAA >60 11/14/2013 2211    No results found for: SPEP, UPEP  Lab Results  Component Value Date  WBC 4.6 10/11/2017   NEUTROABS 1.9 10/11/2017   HGB 12.8 (L) 10/11/2017   HCT 38.8 (L) 10/11/2017   MCV 87.3 10/11/2017   PLT 214 10/11/2017      Chemistry      Component Value Date/Time   NA 138 10/11/2017 0932   NA 140 11/14/2013 2211   K 3.4 (L) 10/11/2017 0932   K 3.6 11/14/2013 2211   CL 99 10/11/2017 0932   CL 103 11/14/2013 2211   CO2 27 10/11/2017 0932   CO2 28 11/14/2013 2211   BUN 20 10/11/2017 0932   BUN 25 (H) 11/14/2013 2211   CREATININE 1.17 10/11/2017 0932   CREATININE 1.24 11/14/2013 2211      Component Value Date/Time   CALCIUM 9.4 10/11/2017 0932   CALCIUM 9.0 11/14/2013 2211   ALKPHOS 58 10/11/2017 0932   AST 24 10/11/2017 0932   ALT 13 10/11/2017 0932   BILITOT 0.5 10/11/2017 0932       RADIOGRAPHIC STUDIES: I have personally reviewed the radiological images as listed and agreed with the findings in the report. No results found.   ASSESSMENT & PLAN:  Primary cancer of right upper lobe of lung ([place]) #Adenocarcinoma stage II [T3N0] ; right upper lobe lung nodule 19 mm lung nodule to be 21.  S/p 4 cycles of carbo-alimta.  May  2019 CT scan- PR.  #Long discussion with the patient regarding the optimal treatment would be surgery post chemotherapy.  However as patient declined surgery the next option would be radiation. Awaiting to start SBRT; simulation on July 24th.   # HTN-improved; continue current medications.   # in 6 weeks/follow up/labs.   # 25 minutes face-to-face with the patient discussing the above plan of care; more than 50% of time spent on prognosis/ natural history; counseling and coordination.   No orders of the defined types were placed in this encounter.  All questions were answered. The patient knows to call the clinic with any problems, questions or concerns.      Cammie Sickle, MD 10/11/2017 10:53 AM

## 2017-10-11 NOTE — Assessment & Plan Note (Addendum)
#  Adenocarcinoma stage II [T3N0] ; right upper lobe lung nodule 19 mm lung nodule to be 21.  S/p 4 cycles of carbo-alimta.  May 2019 CT scan- PR.  #Long discussion with the patient regarding the optimal treatment would be surgery post chemotherapy.  However as patient declined surgery the next option would be radiation. Awaiting to start SBRT; simulation on July 24th.   # HTN-improved; continue current medications.   # in 6 weeks/follow up/labs.   # 25 minutes face-to-face with the patient discussing the above plan of care; more than 50% of time spent on prognosis/ natural history; counseling and coordination.

## 2017-10-19 ENCOUNTER — Ambulatory Visit
Admission: RE | Admit: 2017-10-19 | Discharge: 2017-10-19 | Disposition: A | Discharge status: Home / Self Care | Payer: Medicare Other | Source: Ambulatory Visit | Attending: Radiation Oncology | Admitting: Radiation Oncology

## 2017-10-19 DIAGNOSIS — C3411 Malignant neoplasm of upper lobe, right bronchus or lung: Secondary | ICD-10-CM | POA: Insufficient documentation

## 2017-10-19 DIAGNOSIS — Z510 Encounter for antineoplastic radiation therapy: Secondary | ICD-10-CM | POA: Diagnosis not present

## 2017-10-25 DIAGNOSIS — C3411 Malignant neoplasm of upper lobe, right bronchus or lung: Secondary | ICD-10-CM | POA: Diagnosis not present

## 2017-11-01 ENCOUNTER — Ambulatory Visit
Admission: RE | Admit: 2017-11-01 | Discharge: 2017-11-01 | Disposition: A | Discharge status: Home / Self Care | Payer: Medicare Other | Source: Ambulatory Visit | Attending: Radiation Oncology | Admitting: Radiation Oncology

## 2017-11-01 DIAGNOSIS — Z510 Encounter for antineoplastic radiation therapy: Secondary | ICD-10-CM | POA: Diagnosis not present

## 2017-11-01 DIAGNOSIS — C3411 Malignant neoplasm of upper lobe, right bronchus or lung: Secondary | ICD-10-CM | POA: Insufficient documentation

## 2017-11-03 ENCOUNTER — Ambulatory Visit
Admission: RE | Admit: 2017-11-03 | Discharge: 2017-11-03 | Disposition: A | Discharge status: Home / Self Care | Payer: Medicare Other | Source: Ambulatory Visit | Attending: Radiation Oncology | Admitting: Radiation Oncology

## 2017-11-03 DIAGNOSIS — C3411 Malignant neoplasm of upper lobe, right bronchus or lung: Secondary | ICD-10-CM | POA: Diagnosis not present

## 2017-11-08 ENCOUNTER — Ambulatory Visit
Admission: RE | Admit: 2017-11-08 | Discharge: 2017-11-08 | Disposition: A | Discharge status: Home / Self Care | Payer: Medicare Other | Source: Ambulatory Visit | Attending: Radiation Oncology | Admitting: Radiation Oncology

## 2017-11-08 DIAGNOSIS — C3411 Malignant neoplasm of upper lobe, right bronchus or lung: Secondary | ICD-10-CM | POA: Diagnosis not present

## 2017-11-10 ENCOUNTER — Ambulatory Visit
Admission: RE | Admit: 2017-11-10 | Discharge: 2017-11-10 | Disposition: A | Discharge status: Home / Self Care | Payer: Medicare Other | Source: Ambulatory Visit | Attending: Radiation Oncology | Admitting: Radiation Oncology

## 2017-11-10 DIAGNOSIS — C3411 Malignant neoplasm of upper lobe, right bronchus or lung: Secondary | ICD-10-CM | POA: Diagnosis not present

## 2017-11-13 ENCOUNTER — Ambulatory Visit: Admission: RE | Admit: 2017-11-13 | Payer: Medicare Other | Source: Ambulatory Visit

## 2017-11-15 ENCOUNTER — Ambulatory Visit
Admission: RE | Admit: 2017-11-15 | Discharge: 2017-11-15 | Disposition: A | Discharge status: Home / Self Care | Payer: Medicare Other | Source: Ambulatory Visit | Attending: Radiation Oncology | Admitting: Radiation Oncology

## 2017-11-15 ENCOUNTER — Ambulatory Visit: Payer: Medicare Other

## 2017-11-15 DIAGNOSIS — C3411 Malignant neoplasm of upper lobe, right bronchus or lung: Secondary | ICD-10-CM | POA: Diagnosis not present

## 2017-11-21 ENCOUNTER — Other Ambulatory Visit: Payer: Self-pay | Admitting: *Deleted

## 2017-11-21 DIAGNOSIS — C3411 Malignant neoplasm of upper lobe, right bronchus or lung: Secondary | ICD-10-CM

## 2017-11-22 ENCOUNTER — Other Ambulatory Visit: Payer: Self-pay

## 2017-11-22 ENCOUNTER — Inpatient Hospital Stay: Payer: Medicare Other

## 2017-11-22 ENCOUNTER — Inpatient Hospital Stay: Payer: Medicare Other | Attending: Internal Medicine | Admitting: Internal Medicine

## 2017-11-22 DIAGNOSIS — N189 Chronic kidney disease, unspecified: Secondary | ICD-10-CM | POA: Diagnosis not present

## 2017-11-22 DIAGNOSIS — Z7982 Long term (current) use of aspirin: Secondary | ICD-10-CM

## 2017-11-22 DIAGNOSIS — I129 Hypertensive chronic kidney disease with stage 1 through stage 4 chronic kidney disease, or unspecified chronic kidney disease: Secondary | ICD-10-CM | POA: Insufficient documentation

## 2017-11-22 DIAGNOSIS — C3411 Malignant neoplasm of upper lobe, right bronchus or lung: Secondary | ICD-10-CM | POA: Insufficient documentation

## 2017-11-22 DIAGNOSIS — Z9221 Personal history of antineoplastic chemotherapy: Secondary | ICD-10-CM

## 2017-11-22 DIAGNOSIS — I739 Peripheral vascular disease, unspecified: Secondary | ICD-10-CM | POA: Insufficient documentation

## 2017-11-22 DIAGNOSIS — Z87891 Personal history of nicotine dependence: Secondary | ICD-10-CM

## 2017-11-22 DIAGNOSIS — R5383 Other fatigue: Secondary | ICD-10-CM | POA: Diagnosis not present

## 2017-11-22 DIAGNOSIS — K219 Gastro-esophageal reflux disease without esophagitis: Secondary | ICD-10-CM

## 2017-11-22 DIAGNOSIS — Z79899 Other long term (current) drug therapy: Secondary | ICD-10-CM | POA: Insufficient documentation

## 2017-11-22 DIAGNOSIS — R5381 Other malaise: Secondary | ICD-10-CM | POA: Diagnosis not present

## 2017-11-22 DIAGNOSIS — I252 Old myocardial infarction: Secondary | ICD-10-CM | POA: Diagnosis not present

## 2017-11-22 DIAGNOSIS — E785 Hyperlipidemia, unspecified: Secondary | ICD-10-CM | POA: Diagnosis not present

## 2017-11-22 LAB — COMPREHENSIVE METABOLIC PANEL
ALT: 10 U/L (ref 0–44)
AST: 21 U/L (ref 15–41)
Albumin: 3.7 g/dL (ref 3.5–5.0)
Alkaline Phosphatase: 63 U/L (ref 38–126)
Anion gap: 9 (ref 5–15)
BILIRUBIN TOTAL: 0.4 mg/dL (ref 0.3–1.2)
BUN: 17 mg/dL (ref 8–23)
CO2: 27 mmol/L (ref 22–32)
CREATININE: 0.95 mg/dL (ref 0.61–1.24)
Calcium: 9 mg/dL (ref 8.9–10.3)
Chloride: 100 mmol/L (ref 98–111)
GLUCOSE: 106 mg/dL — AB (ref 70–99)
Potassium: 3.9 mmol/L (ref 3.5–5.1)
Sodium: 136 mmol/L (ref 135–145)
Total Protein: 7.9 g/dL (ref 6.5–8.1)

## 2017-11-22 LAB — CBC WITH DIFFERENTIAL/PLATELET
BASOS PCT: 1 %
Basophils Absolute: 0 10*3/uL (ref 0–0.1)
EOS ABS: 0.2 10*3/uL (ref 0–0.7)
EOS PCT: 4 %
HEMATOCRIT: 42.9 % (ref 40.0–52.0)
Hemoglobin: 14.1 g/dL (ref 13.0–18.0)
Lymphocytes Relative: 36 %
Lymphs Abs: 1.5 10*3/uL (ref 1.0–3.6)
MCH: 28.5 pg (ref 26.0–34.0)
MCHC: 32.8 g/dL (ref 32.0–36.0)
MCV: 86.9 fL (ref 80.0–100.0)
MONO ABS: 0.7 10*3/uL (ref 0.2–1.0)
MONOS PCT: 18 %
Neutro Abs: 1.6 10*3/uL (ref 1.4–6.5)
Neutrophils Relative %: 41 %
Platelets: 214 10*3/uL (ref 150–440)
RBC: 4.94 MIL/uL (ref 4.40–5.90)
RDW: 14.6 % — AB (ref 11.5–14.5)
WBC: 4 10*3/uL (ref 3.8–10.6)

## 2017-11-22 NOTE — Assessment & Plan Note (Addendum)
#   Right upper lobe Adenocarcinoma stage II [T3N0]    S/p 4 cycles of carbo-alimta; followed by SBRT [finished August 20/2019]  #Clinically stable.  Recommend CT scan in 3 months.  # HTN poorly controlled; systolic 428; worse.  Recommend restarting Norvasc 10 mg and also hydrochlorothiazide 25 mg a day.  Written instructions were given.  Again discussed at length with the patient regarding I mportance of well-controlled blood pressures otherwise at risk for heart attack strokes.  #Mild fatigue-stable-multifactorial; recommend better pool of blood pressure.   # follow up in 3 months/labs/CT chest prior- Dr.B

## 2017-11-22 NOTE — Progress Notes (Signed)
[place] OFFICE PROGRESS NOTE  Patient Care Team: Patient, No Pcp Per as PCP - General ([place]) Telford Nab, RN as Registered Nurse  Cancer Staging No matching staging information was found for the patient.   Oncology History   #  RUL Adeno Ca-~ 19 mm in size  with SUV of 21 [LDCT];- ADENOCARCINOMA, ACINAR AND SOLID PATTERN. Stage I [T1No] vs stage II [T3N0]  # march 19th- CARBO-ALIMTA q 3 wx4; PR; declined surgery; Aug 2019- SBRT [finished aug 20th, 2019]  # 2019 Feb Liver lesion/uptake on PET - incidental- MRI liver -NEG  DIAGNOSIS: [ MARCH 2019]  STAGE:  II  ;GOALS: curative  CURRENT/MOST RECENT THERAPY [ MARCH 2019] Neo-Adj carbo-alimta x4; declined Surgery; SBRT [aug 20th 2019]      Primary cancer of right upper lobe of lung ([place])      INTERVAL HISTORY:  Ruben Diaz 72 y.o.  male pleasant patient above history of stage II right upper lobe lung cancer status post SBRT is here for follow-up.  Patient finished with patient on August 20.  Patient denies any worsening shortness of breath or cough.  Appetite is fair.  No headaches.  No nausea no vomiting.   Review of Systems  Constitutional: Positive for malaise/fatigue. Negative for chills, diaphoresis, fever and weight loss.  HENT: Negative for nosebleeds and sore throat.   Eyes: Negative for double vision.  Respiratory: Negative for cough, hemoptysis, sputum production, shortness of breath and wheezing.   Cardiovascular: Negative for chest pain, palpitations, orthopnea and leg swelling.  Gastrointestinal: Negative for abdominal pain, blood in stool, constipation, diarrhea, heartburn, melena, nausea and vomiting.  Genitourinary: Negative for dysuria, frequency and urgency.  Musculoskeletal: Negative for back pain and joint pain.  Skin: Negative.  Negative for itching and rash.  Neurological: Negative for dizziness, tingling, focal weakness, weakness and headaches.   Endo/Heme/Allergies: Does not bruise/bleed easily.  Psychiatric/Behavioral: Negative for depression. The patient is not nervous/anxious and does not have insomnia.       PAST MEDICAL HISTORY :  Past Medical History:  Diagnosis Date  . Cancer ([place])   . Chronic kidney disease   . Dysphagia   . GERD (gastroesophageal reflux disease)   . History of BPH   . Hyperlipidemia   . Hypertension   . Myocardial infarction ([place])   . Peripheral vascular disease ([place])     PAST SURGICAL HISTORY :   Past Surgical History:  Procedure Laterality Date  . APPENDECTOMY    . COLONOSCOPY WITH PROPOFOL N/A 09/16/2016   Procedure: COLONOSCOPY WITH PROPOFOL;  Surgeon: [name], MD;  Location: [facility] ENDOSCOPY;  Service: Endoscopy;  Laterality: N/A;  . ESOPHAGOGASTRODUODENOSCOPY (EGD) WITH PROPOFOL N/A 09/16/2016   Procedure: ESOPHAGOGASTRODUODENOSCOPY (EGD) WITH PROPOFOL;  Surgeon: [name], MD;  Location: [facility] ENDOSCOPY;  Service: Endoscopy;  Laterality: N/A;  . ESOPHAGOGASTRODUODENOSCOPY (EGD) WITH PROPOFOL N/A 12/16/2016   Procedure: ESOPHAGOGASTRODUODENOSCOPY (EGD) WITH PROPOFOL;  Surgeon: [name], MD;  Location: [facility] ENDOSCOPY;  Service: Endoscopy;  Laterality: N/A;  . STOMACH SURGERY      FAMILY HISTORY :  No family history on file.  SOCIAL HISTORY:   Social History   Tobacco Use  . Smoking status: Former Smoker    Packs/day: 0.70    Years: 51.00    Pack years: 35.70    Last attempt to quit: 2013    Years since quitting: 6.6  . Smokeless tobacco: Never Used  Substance Use Topics  . Alcohol use:  No  . Drug use: No    ALLERGIES:  is allergic to sucralfate.  MEDICATIONS:  Current Outpatient Medications  Medication Sig Dispense Refill  . aspirin EC 81 MG tablet Take 81 mg by mouth daily.    Marland Kitchen atorvastatin (LIPITOR) 10 MG tablet Take 10 mg by mouth daily.    . pantoprazole (PROTONIX) 40 MG tablet Take 1 tablet by mouth daily.    Marland Kitchen amLODipine (NORVASC) 10 MG  tablet Take 10 mg by mouth daily.    . hydrochlorothiazide (HYDRODIURIL) 25 MG tablet Take 25 mg by mouth daily.     No current facility-administered medications for this visit.     PHYSICAL EXAMINATION: ECOG PERFORMANCE STATUS: 0 - Asymptomatic  BP (!) 195/93   Pulse (!) 58   Temp 97.6 F (36.4 C) (Tympanic)   Resp 20   Ht 6' (1.829 m)   Wt 209 lb (94.8 kg)   BMI 28.35 kg/m   Filed Weights   11/22/17 1050  Weight: 209 lb (94.8 kg)   Physical Exam  Constitutional: He is oriented to person, place, and time and well-developed, well-nourished, and in no distress.  HENT:  Head: Normocephalic and atraumatic.  Mouth/Throat: Oropharynx is clear and moist. No oropharyngeal exudate.  Eyes: Pupils are equal, round, and reactive to light.  Neck: Normal range of motion. Neck supple.  Cardiovascular: Normal rate and regular rhythm.  Pulmonary/Chest: No respiratory distress. He has no wheezes.  Decreased air entry bilaterally.  Abdominal: Soft. Bowel sounds are normal. He exhibits no distension and no mass. There is no tenderness. There is no rebound and no guarding.  Musculoskeletal: Normal range of motion. He exhibits no edema or tenderness.  Neurological: He is alert and oriented to person, place, and time.  Skin: Skin is warm.  Psychiatric: Affect normal.     LABORATORY DATA:  I have reviewed the data as listed    Component Value Date/Time   NA 136 11/22/2017 1035   NA 140 11/14/2013 2211   K 3.9 11/22/2017 1035   K 3.6 11/14/2013 2211   CL 100 11/22/2017 1035   CL 103 11/14/2013 2211   CO2 27 11/22/2017 1035   CO2 28 11/14/2013 2211   GLUCOSE 106 (H) 11/22/2017 1035   GLUCOSE 110 (H) 11/14/2013 2211   BUN 17 11/22/2017 1035   BUN 25 (H) 11/14/2013 2211   CREATININE 0.95 11/22/2017 1035   CREATININE 1.24 11/14/2013 2211   CALCIUM 9.0 11/22/2017 1035   CALCIUM 9.0 11/14/2013 2211   PROT 7.9 11/22/2017 1035   ALBUMIN 3.7 11/22/2017 1035   AST 21 11/22/2017 1035    ALT 10 11/22/2017 1035   ALKPHOS 63 11/22/2017 1035   BILITOT 0.4 11/22/2017 1035   GFRNONAA >60 11/22/2017 1035   GFRNONAA 59 (L) 11/14/2013 2211   GFRAA >60 11/22/2017 1035   GFRAA >60 11/14/2013 2211    No results found for: SPEP, UPEP  Lab Results  Component Value Date   WBC 4.0 11/22/2017   NEUTROABS 1.6 11/22/2017   HGB 14.1 11/22/2017   HCT 42.9 11/22/2017   MCV 86.9 11/22/2017   PLT 214 11/22/2017      Chemistry      Component Value Date/Time   NA 136 11/22/2017 1035   NA 140 11/14/2013 2211   K 3.9 11/22/2017 1035   K 3.6 11/14/2013 2211   CL 100 11/22/2017 1035   CL 103 11/14/2013 2211   CO2 27 11/22/2017 1035   CO2 28 11/14/2013 2211  BUN 17 11/22/2017 1035   BUN 25 (H) 11/14/2013 2211   CREATININE 0.95 11/22/2017 1035   CREATININE 1.24 11/14/2013 2211      Component Value Date/Time   CALCIUM 9.0 11/22/2017 1035   CALCIUM 9.0 11/14/2013 2211   ALKPHOS 63 11/22/2017 1035   AST 21 11/22/2017 1035   ALT 10 11/22/2017 1035   BILITOT 0.4 11/22/2017 1035       RADIOGRAPHIC STUDIES: I have personally reviewed the radiological images as listed and agreed with the findings in the report. No results found.   ASSESSMENT & PLAN:  Primary cancer of right upper lobe of lung ([place]) # Right upper lobe Adenocarcinoma stage II [T3N0]    S/p 4 cycles of carbo-alimta; followed by SBRT [finished August 20/2019]  #Clinically stable.  Recommend CT scan in 3 months.  # HTN poorly controlled; systolic 774; worse.  Recommend restarting Norvasc 10 mg and also hydrochlorothiazide 25 mg a day.  Written instructions were given.  Again discussed at length with the patient regarding I mportance of well-controlled blood pressures otherwise at risk for heart attack strokes.  #Mild fatigue-stable-multifactorial; recommend better pool of blood pressure.   # follow up in 3 months/labs/CT chest prior- Dr.B    Orders Placed This Encounter  Procedures  . CT CHEST W CONTRAST     Standing Status:   Future    Standing Expiration Date:   11/23/2018    Order Specific Question:   If indicated for the ordered procedure, I authorize the administration of contrast media per Radiology protocol    Answer:   Yes    Order Specific Question:   Preferred imaging location?    Answer:   [facility] [place]    Order Specific Question:   Radiology Contrast Protocol - do NOT remove file path    Answer:   [email or web address]    Order Specific Question:   ** REASON FOR EXAM (FREE TEXT)    Answer:   lung cancer s/p RT  . CBC with Differential    Standing Status:   Future    Standing Expiration Date:   11/23/2018  . Comprehensive metabolic panel    Standing Status:   Future    Standing Expiration Date:   11/23/2018   All questions were answered. The patient knows to call the clinic with any problems, questions or concerns.      Cammie Sickle, MD 11/22/2017 2:39 PM

## 2017-12-26 ENCOUNTER — Ambulatory Visit: Payer: Medicare Other | Admitting: Radiation Oncology

## 2017-12-29 ENCOUNTER — Encounter: Payer: Self-pay | Admitting: Radiation Oncology

## 2017-12-29 ENCOUNTER — Other Ambulatory Visit: Payer: Self-pay

## 2017-12-29 ENCOUNTER — Ambulatory Visit
Admission: RE | Admit: 2017-12-29 | Discharge: 2017-12-29 | Disposition: A | Discharge status: Home / Self Care | Payer: Medicare Other | Source: Ambulatory Visit | Attending: Radiation Oncology | Admitting: Radiation Oncology

## 2017-12-29 VITALS — BP 123/74 | HR 66 | Temp 97.0°F | Resp 18 | Wt 209.2 lb

## 2017-12-29 DIAGNOSIS — Z9221 Personal history of antineoplastic chemotherapy: Secondary | ICD-10-CM | POA: Diagnosis not present

## 2017-12-29 DIAGNOSIS — C3411 Malignant neoplasm of upper lobe, right bronchus or lung: Secondary | ICD-10-CM

## 2017-12-29 NOTE — Progress Notes (Signed)
Radiation Oncology Follow up Note  Name: Ruben Diaz   Date:   12/29/2017 MRN:  [id number] DOB: 1945-06-26    This 72 y.o. male presents to the clinic today for one-month follow-up status post SB RT to his right upper lobe for stage I adenocarcinoma.  REFERRING PROVIDER: No ref. provider found  HPI: patient is a 72 year old male now out 1 month having completed SB RT to his right upper lobe status post neoadjuvant chemotherapy for stage I.adenocarcinoma.he is seen today in routine follow-up is doing well specifically denies dysphagia cough hemoptysis or chest tightness.has not had recent imaging is scheduled for CT scan in November.he has been having some troubles with regulating his hypertension.  COMPLICATIONS OF TREATMENT: none  FOLLOW UP COMPLIANCE: keeps appointments   PHYSICAL EXAM:  BP 123/74 (BP Location: Right Arm, Patient Position: Sitting)   Pulse 66   Temp (!) 97 F (36.1 C) (Tympanic)   Resp 18   Wt 209 lb 3.5 oz (94.9 kg)   SpO2 96% Comment: room air  BMI 28.37 kg/m  Well-developed well-nourished patient in NAD. HEENT reveals PERLA, EOMI, discs not visualized.  Oral cavity is clear. No oral mucosal lesions are identified. Neck is clear without evidence of cervical or supraclavicular adenopathy. Lungs are clear to A&P. Cardiac examination is essentially unremarkable with regular rate and rhythm without murmur rub or thrill. Abdomen is benign with no organomegaly or masses noted. Motor sensory and DTR levels are equal and symmetric in the upper and lower extremities. Cranial nerves II through XII are grossly intact. Proprioception is intact. No peripheral adenopathy or edema is identified. No motor or sensory levels are noted. Crude visual fields are within normal range.  RADIOLOGY RESULTS: no current films for review  PLAN: present time he is doing well. He is tolerated treatments with very little side effect profile. He is scheduled for a CT scan in November  I've asked to see him back in follow-up after that. Patient continues close follow-up care with medical oncology. He knows to call with any concerns.  I would like to take this opportunity to thank you for allowing me to participate in the care of your patient.Noreene Filbert, MD

## 2018-02-13 ENCOUNTER — Ambulatory Visit
Admission: RE | Admit: 2018-02-13 | Discharge: 2018-02-13 | Disposition: A | Discharge status: Home / Self Care | Payer: Medicare Other | Source: Ambulatory Visit | Attending: Internal Medicine | Admitting: Internal Medicine

## 2018-02-13 ENCOUNTER — Inpatient Hospital Stay: Payer: Medicare Other | Attending: Internal Medicine

## 2018-02-13 DIAGNOSIS — C3411 Malignant neoplasm of upper lobe, right bronchus or lung: Secondary | ICD-10-CM

## 2018-02-13 DIAGNOSIS — E785 Hyperlipidemia, unspecified: Secondary | ICD-10-CM | POA: Diagnosis not present

## 2018-02-13 DIAGNOSIS — I700 Atherosclerosis of aorta: Secondary | ICD-10-CM | POA: Diagnosis not present

## 2018-02-13 DIAGNOSIS — I2510 Atherosclerotic heart disease of native coronary artery without angina pectoris: Secondary | ICD-10-CM | POA: Insufficient documentation

## 2018-02-13 DIAGNOSIS — J439 Emphysema, unspecified: Secondary | ICD-10-CM | POA: Insufficient documentation

## 2018-02-13 DIAGNOSIS — Z7982 Long term (current) use of aspirin: Secondary | ICD-10-CM | POA: Diagnosis not present

## 2018-02-13 DIAGNOSIS — I129 Hypertensive chronic kidney disease with stage 1 through stage 4 chronic kidney disease, or unspecified chronic kidney disease: Secondary | ICD-10-CM | POA: Insufficient documentation

## 2018-02-13 DIAGNOSIS — N189 Chronic kidney disease, unspecified: Secondary | ICD-10-CM | POA: Insufficient documentation

## 2018-02-13 DIAGNOSIS — K219 Gastro-esophageal reflux disease without esophagitis: Secondary | ICD-10-CM | POA: Diagnosis not present

## 2018-02-13 DIAGNOSIS — Z87891 Personal history of nicotine dependence: Secondary | ICD-10-CM | POA: Insufficient documentation

## 2018-02-13 DIAGNOSIS — I252 Old myocardial infarction: Secondary | ICD-10-CM | POA: Diagnosis not present

## 2018-02-13 DIAGNOSIS — R5383 Other fatigue: Secondary | ICD-10-CM | POA: Diagnosis not present

## 2018-02-13 DIAGNOSIS — R5381 Other malaise: Secondary | ICD-10-CM | POA: Insufficient documentation

## 2018-02-13 DIAGNOSIS — N400 Enlarged prostate without lower urinary tract symptoms: Secondary | ICD-10-CM | POA: Insufficient documentation

## 2018-02-13 DIAGNOSIS — Z79899 Other long term (current) drug therapy: Secondary | ICD-10-CM | POA: Diagnosis not present

## 2018-02-13 DIAGNOSIS — I739 Peripheral vascular disease, unspecified: Secondary | ICD-10-CM | POA: Insufficient documentation

## 2018-02-13 LAB — COMPREHENSIVE METABOLIC PANEL WITH GFR
ALT: 13 U/L (ref 0–44)
AST: 23 U/L (ref 15–41)
Albumin: 3.9 g/dL (ref 3.5–5.0)
Alkaline Phosphatase: 51 U/L (ref 38–126)
Anion gap: 12 (ref 5–15)
BUN: 23 mg/dL (ref 8–23)
CO2: 28 mmol/L (ref 22–32)
Calcium: 9.4 mg/dL (ref 8.9–10.3)
Chloride: 94 mmol/L — ABNORMAL LOW (ref 98–111)
Creatinine, Ser: 1.19 mg/dL (ref 0.61–1.24)
GFR calc Af Amer: >60 mL/min
GFR calc non Af Amer: 59 mL/min — ABNORMAL LOW
Glucose, Bld: 94 mg/dL (ref 70–99)
Potassium: 3.5 mmol/L (ref 3.5–5.1)
Sodium: 134 mmol/L — ABNORMAL LOW (ref 135–145)
Total Bilirubin: 0.9 mg/dL (ref 0.3–1.2)
Total Protein: 8.1 g/dL (ref 6.5–8.1)

## 2018-02-13 LAB — CBC WITH DIFFERENTIAL/PLATELET
Abs Immature Granulocytes: 0.01 10*3/uL (ref 0.00–0.07)
BASOS ABS: 0.1 10*3/uL (ref 0.0–0.1)
BASOS PCT: 1 %
Eosinophils Absolute: 0.1 10*3/uL (ref 0.0–0.5)
Eosinophils Relative: 3 %
HCT: 41.6 % (ref 39.0–52.0)
Hemoglobin: 13.6 g/dL (ref 13.0–17.0)
Immature Granulocytes: 0 %
LYMPHS PCT: 34 %
Lymphs Abs: 1.5 10*3/uL (ref 0.7–4.0)
MCH: 27.8 pg (ref 26.0–34.0)
MCHC: 32.7 g/dL (ref 30.0–36.0)
MCV: 85.1 fL (ref 80.0–100.0)
MONO ABS: 0.7 10*3/uL (ref 0.1–1.0)
Monocytes Relative: 15 %
NRBC: 0 % (ref 0.0–0.2)
Neutro Abs: 2 10*3/uL (ref 1.7–7.7)
Neutrophils Relative %: 47 %
PLATELETS: 197 10*3/uL (ref 150–400)
RBC: 4.89 MIL/uL (ref 4.22–5.81)
RDW: 14.6 % (ref 11.5–15.5)
WBC: 4.4 10*3/uL (ref 4.0–10.5)

## 2018-02-13 MED ORDER — IOHEXOL 300 MG/ML  SOLN
75.0000 mL | Freq: Once | INTRAMUSCULAR | Status: AC | PRN
  Administered 2018-02-13: 75 mL via INTRAVENOUS

## 2018-02-21 ENCOUNTER — Encounter: Payer: Self-pay | Admitting: Internal Medicine

## 2018-02-21 ENCOUNTER — Inpatient Hospital Stay (HOSPITAL_BASED_OUTPATIENT_CLINIC_OR_DEPARTMENT_OTHER): Payer: Medicare Other | Admitting: Internal Medicine

## 2018-02-21 ENCOUNTER — Other Ambulatory Visit: Payer: Self-pay

## 2018-02-21 VITALS — BP 130/78 | HR 67 | Temp 97.6°F | Resp 20 | Ht 72.0 in | Wt 209.0 lb

## 2018-02-21 DIAGNOSIS — Z87891 Personal history of nicotine dependence: Secondary | ICD-10-CM

## 2018-02-21 DIAGNOSIS — Z7982 Long term (current) use of aspirin: Secondary | ICD-10-CM

## 2018-02-21 DIAGNOSIS — E785 Hyperlipidemia, unspecified: Secondary | ICD-10-CM

## 2018-02-21 DIAGNOSIS — C3411 Malignant neoplasm of upper lobe, right bronchus or lung: Secondary | ICD-10-CM

## 2018-02-21 DIAGNOSIS — I739 Peripheral vascular disease, unspecified: Secondary | ICD-10-CM

## 2018-02-21 DIAGNOSIS — N400 Enlarged prostate without lower urinary tract symptoms: Secondary | ICD-10-CM

## 2018-02-21 DIAGNOSIS — R5381 Other malaise: Secondary | ICD-10-CM

## 2018-02-21 DIAGNOSIS — R5383 Other fatigue: Secondary | ICD-10-CM

## 2018-02-21 DIAGNOSIS — Z79899 Other long term (current) drug therapy: Secondary | ICD-10-CM

## 2018-02-21 DIAGNOSIS — I129 Hypertensive chronic kidney disease with stage 1 through stage 4 chronic kidney disease, or unspecified chronic kidney disease: Secondary | ICD-10-CM | POA: Diagnosis not present

## 2018-02-21 DIAGNOSIS — I252 Old myocardial infarction: Secondary | ICD-10-CM

## 2018-02-21 DIAGNOSIS — K219 Gastro-esophageal reflux disease without esophagitis: Secondary | ICD-10-CM

## 2018-02-21 DIAGNOSIS — N189 Chronic kidney disease, unspecified: Secondary | ICD-10-CM

## 2018-02-21 MED ORDER — AMLODIPINE BESYLATE 10 MG PO TABS: 10.0000 mg | ORAL_TABLET | Freq: Every day | ORAL | 3 refills | Status: DC

## 2018-02-21 MED ORDER — HYDROCHLOROTHIAZIDE 25 MG PO TABS: 25.0000 mg | ORAL_TABLET | Freq: Every day | ORAL | 3 refills | Status: DC

## 2018-02-21 NOTE — Progress Notes (Signed)
[facility] OFFICE PROGRESS NOTE  Patient Care Team: Patient, No Pcp Per as PCP - General ([place]) Telford Nab, RN as Registered Nurse  Cancer Staging No matching staging information was found for the patient.   Oncology History   #  RUL [place] Ca-~ 19 mm in size  with SUV of 21 [LDCT];- ADENOCARCINOMA, ACINAR AND SOLID PATTERN. Stage I [T1No] vs stage II [T3N0]  # march 19th- CARBO-ALIMTA q 3 wx4; PR; declined surgery; Aug 2019- SBRT [finished aug 20th, 2019]  # 2019 Feb Liver lesion/uptake on PET - incidental- MRI liver -NEG  DIAGNOSIS: [ MARCH 2019]  STAGE:  II  ;GOALS: curative  CURRENT/MOST RECENT THERAPY: [facility]     Primary cancer of right upper lobe of lung ([place])      INTERVAL HISTORY:  Alhaji Mcneal 72 y.o.  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  Patient's appetite is good.  No weight loss.  No nausea no vomiting.  No headaches.  States that he is run out of his blood pressure medication.  Review of Systems  Constitutional: Positive for malaise/fatigue. Negative for chills, diaphoresis, fever and weight loss.  HENT: Negative for nosebleeds and sore throat.   Eyes: Negative for double vision.  Respiratory: Negative for cough, hemoptysis, sputum production, shortness of breath and wheezing.   Cardiovascular: Negative for chest pain, palpitations, orthopnea and leg swelling.  Gastrointestinal: Negative for abdominal pain, blood in stool, constipation, diarrhea, heartburn, melena, nausea and vomiting.  Genitourinary: Negative for dysuria, frequency and urgency.  Musculoskeletal: Negative for back pain and joint pain.  Skin: Negative.  Negative for itching and rash.  Neurological: Negative for dizziness, tingling, focal weakness, weakness and headaches.  Endo/Heme/Allergies: Does not bruise/bleed easily.   Psychiatric/Behavioral: Negative for depression. The patient is not nervous/anxious and does not have insomnia.       PAST MEDICAL HISTORY :  Past Medical History:  Diagnosis Date  . Cancer ([place])   . Chronic kidney disease   . Dysphagia   . GERD (gastroesophageal reflux disease)   . History of BPH   . Hyperlipidemia   . Hypertension   . Myocardial infarction ([place])   . Peripheral vascular disease ([place])     PAST SURGICAL HISTORY :   Past Surgical History:  Procedure Laterality Date  . APPENDECTOMY    . COLONOSCOPY WITH PROPOFOL N/A 09/16/2016   Procedure: COLONOSCOPY WITH PROPOFOL;  Surgeon: [name], MD;  Location[facility] ENDOSCOPY;  Service: Endoscopy;  Laterality: N/A;  . ESOPHAGOGASTRODUODENOSCOPY (EGD) WITH PROPOFOL N/A 09/16/2016   Procedure: ESOPHAGOGASTRODUODENOSCOPY (EGD) WITH PROPOFOL;  Surgeon: [name], MD;  Location: [facility] ENDOSCOPY;  Service: Endoscopy;  Laterality: N/A;  . ESOPHAGOGASTRODUODENOSCOPY (EGD) WITH PROPOFOL N/A 12/16/2016   Procedure: ESOPHAGOGASTRODUODENOSCOPY (EGD) WITH PROPOFOL;  Surgeon: [name], MD;  Location: [facility] ENDOSCOPY;  Service: Endoscopy;  Laterality: N/A;  . STOMACH SURGERY      FAMILY HISTORY :  History reviewed. No pertinent family history.  SOCIAL HISTORY:   Social History   Tobacco Use  . Smoking status: Former Smoker    Packs/day: 0.70    Years: 51.00    Pack years: 35.70    Last attempt to quit: 2013    Years since quitting: 6.9  . Smokeless tobacco: Never Used  Substance Use Topics  . Alcohol use: No  . Drug use: No  ALLERGIES:  is allergic to sucralfate.  MEDICATIONS:  Current Outpatient Medications  Medication Sig Dispense Refill  . amLODipine (NORVASC) 10 MG tablet Take 1 tablet (10 mg total) by mouth daily. 30 tablet 3  . aspirin EC 81 MG tablet Take 81 mg by mouth daily.    Marland Kitchen atorvastatin (LIPITOR) 10 MG tablet Take 10 mg by mouth daily.    . hydrochlorothiazide (HYDRODIURIL) 25 MG  tablet Take 1 tablet (25 mg total) by mouth daily. 30 tablet 3  . pantoprazole (PROTONIX) 40 MG tablet Take 1 tablet by mouth daily.     No current facility-administered medications for this visit.     PHYSICAL EXAMINATION: ECOG PERFORMANCE STATUS: 0 - Asymptomatic  BP 130/78 (Patient Position: Sitting)   Pulse 67   Temp 97.6 F (36.4 C) (Tympanic)   Resp 20   Ht 6' (1.829 m)   Wt 209 lb (94.8 kg)   BMI 28.35 kg/m   Filed Weights   02/21/18 1012  Weight: 209 lb (94.8 kg)   Physical Exam  Constitutional: He is oriented to person, place, and time and well-developed, well-nourished, and in no distress.  HENT:  Head: Normocephalic and atraumatic.  Mouth/Throat: Oropharynx is clear and moist. No oropharyngeal exudate.  Eyes: Pupils are equal, round, and reactive to light.  Neck: Normal range of motion. Neck supple.  Cardiovascular: Normal rate and regular rhythm.  Pulmonary/Chest: No respiratory distress. He has no wheezes.  Decreased air entry bilaterally.  Abdominal: Soft. Bowel sounds are normal. He exhibits no distension and no mass. There is no tenderness. There is no rebound and no guarding.  Musculoskeletal: Normal range of motion. He exhibits no edema or tenderness.  Neurological: He is alert and oriented to person, place, and time.  Skin: Skin is warm.  Psychiatric: Affect normal.     LABORATORY DATA:  I have reviewed the data as listed    Component Value Date/Time   NA 134 (L) 02/13/2018 1013   NA 140 11/14/2013 2211   K 3.5 02/13/2018 1013   K 3.6 11/14/2013 2211   CL 94 (L) 02/13/2018 1013   CL 103 11/14/2013 2211   CO2 28 02/13/2018 1013   CO2 28 11/14/2013 2211   GLUCOSE 94 02/13/2018 1013   GLUCOSE 110 (H) 11/14/2013 2211   BUN 23 02/13/2018 1013   BUN 25 (H) 11/14/2013 2211   CREATININE 1.19 02/13/2018 1013   CREATININE 1.24 11/14/2013 2211   CALCIUM 9.4 02/13/2018 1013   CALCIUM 9.0 11/14/2013 2211   PROT 8.1 02/13/2018 1013   ALBUMIN 3.9  02/13/2018 1013   AST 23 02/13/2018 1013   ALT 13 02/13/2018 1013   ALKPHOS 51 02/13/2018 1013   BILITOT 0.9 02/13/2018 1013   GFRNONAA 59 (L) 02/13/2018 1013   GFRNONAA 59 (L) 11/14/2013 2211   GFRAA >60 02/13/2018 1013   GFRAA >60 11/14/2013 2211    No results found for: SPEP, UPEP  Lab Results  Component Value Date   WBC 4.4 02/13/2018   NEUTROABS 2.0 02/13/2018   HGB 13.6 02/13/2018   HCT 41.6 02/13/2018   MCV 85.1 02/13/2018   PLT 197 02/13/2018      Chemistry      Component Value Date/Time   NA 134 (L) 02/13/2018 1013   NA 140 11/14/2013 2211   K 3.5 02/13/2018 1013   K 3.6 11/14/2013 2211   CL 94 (L) 02/13/2018 1013   CL 103 11/14/2013 2211   CO2 28 02/13/2018 1013  CO2 28 11/14/2013 2211   BUN 23 02/13/2018 1013   BUN 25 (H) 11/14/2013 2211   CREATININE 1.19 02/13/2018 1013   CREATININE 1.24 11/14/2013 2211      Component Value Date/Time   CALCIUM 9.4 02/13/2018 1013   CALCIUM 9.0 11/14/2013 2211   ALKPHOS 51 02/13/2018 1013   AST 23 02/13/2018 1013   ALT 13 02/13/2018 1013   BILITOT 0.9 02/13/2018 1013       RADIOGRAPHIC STUDIES: I have personally reviewed the radiological images as listed and agreed with the findings in the report. No results found.   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  # I would recommend continue surveillance imaging in 6 months.  Would defer further imaging to Dr. Mike Gip.  # HTN- improved; refilled anti-HTN medications. Follow up with PCP  #Discussed follow-up; wants to follow-up in [place]  # DISPOSITION: # Follow up in 3 months-MD-Dr.C/labs-cbc/cmp-Dr.B    No orders of the defined types were placed in this encounter.  All questions were answered. The patient knows to call the clinic  with any problems, questions or concerns.      Cammie Sickle, MD 02/21/2018 10:40 AM

## 2018-02-21 NOTE — Assessment & Plan Note (Addendum)
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  # I would recommend continue surveillance imaging in 6 months.  Would defer further imaging to Dr. Mike Gip.  # HTN- improved; refilled anti-HTN medications. Follow up with PCP  #Discussed follow-up; wants to follow-up in [place]  # DISPOSITION: # Follow up in 3 months-MD-Dr.C/labs-cbc/cmp-Dr.B

## 2018-05-17 ENCOUNTER — Other Ambulatory Visit: Payer: Self-pay

## 2018-05-17 DIAGNOSIS — C3411 Malignant neoplasm of upper lobe, right bronchus or lung: Secondary | ICD-10-CM

## 2018-05-20 ENCOUNTER — Other Ambulatory Visit: Payer: Self-pay | Admitting: Internal Medicine

## 2018-05-23 ENCOUNTER — Inpatient Hospital Stay: Payer: Medicare Other | Attending: Hematology and Oncology | Admitting: Hematology and Oncology

## 2018-05-23 ENCOUNTER — Inpatient Hospital Stay: Payer: Medicare Other

## 2018-05-23 ENCOUNTER — Other Ambulatory Visit: Payer: Self-pay | Admitting: Hematology and Oncology

## 2018-05-23 VITALS — BP 124/72 | HR 65 | Temp 97.7°F | Resp 16 | Wt 212.4 lb

## 2018-05-23 DIAGNOSIS — N189 Chronic kidney disease, unspecified: Secondary | ICD-10-CM | POA: Insufficient documentation

## 2018-05-23 DIAGNOSIS — R05 Cough: Secondary | ICD-10-CM | POA: Diagnosis not present

## 2018-05-23 DIAGNOSIS — R0602 Shortness of breath: Secondary | ICD-10-CM | POA: Diagnosis not present

## 2018-05-23 DIAGNOSIS — E785 Hyperlipidemia, unspecified: Secondary | ICD-10-CM | POA: Diagnosis not present

## 2018-05-23 DIAGNOSIS — R911 Solitary pulmonary nodule: Secondary | ICD-10-CM

## 2018-05-23 DIAGNOSIS — I739 Peripheral vascular disease, unspecified: Secondary | ICD-10-CM | POA: Insufficient documentation

## 2018-05-23 DIAGNOSIS — I129 Hypertensive chronic kidney disease with stage 1 through stage 4 chronic kidney disease, or unspecified chronic kidney disease: Secondary | ICD-10-CM | POA: Insufficient documentation

## 2018-05-23 DIAGNOSIS — Z87891 Personal history of nicotine dependence: Secondary | ICD-10-CM | POA: Diagnosis not present

## 2018-05-23 DIAGNOSIS — C3411 Malignant neoplasm of upper lobe, right bronchus or lung: Secondary | ICD-10-CM | POA: Diagnosis present

## 2018-05-23 DIAGNOSIS — E876 Hypokalemia: Secondary | ICD-10-CM

## 2018-05-23 DIAGNOSIS — N400 Enlarged prostate without lower urinary tract symptoms: Secondary | ICD-10-CM | POA: Insufficient documentation

## 2018-05-23 DIAGNOSIS — Z79899 Other long term (current) drug therapy: Secondary | ICD-10-CM | POA: Diagnosis not present

## 2018-05-23 DIAGNOSIS — I252 Old myocardial infarction: Secondary | ICD-10-CM | POA: Diagnosis not present

## 2018-05-23 DIAGNOSIS — K219 Gastro-esophageal reflux disease without esophagitis: Secondary | ICD-10-CM | POA: Insufficient documentation

## 2018-05-23 DIAGNOSIS — Z7982 Long term (current) use of aspirin: Secondary | ICD-10-CM | POA: Diagnosis not present

## 2018-05-23 LAB — CBC WITH DIFFERENTIAL/PLATELET
Abs Immature Granulocytes: 0.01 10*3/uL (ref 0.00–0.07)
Basophils Absolute: 0.1 10*3/uL (ref 0.0–0.1)
Basophils Relative: 1 %
Eosinophils Absolute: 0.2 10*3/uL (ref 0.0–0.5)
Eosinophils Relative: 3 %
HCT: 43.3 % (ref 39.0–52.0)
Hemoglobin: 14.4 g/dL (ref 13.0–17.0)
Immature Granulocytes: 0 %
Lymphocytes Relative: 36 %
Lymphs Abs: 1.6 10*3/uL (ref 0.7–4.0)
MCH: 28.3 pg (ref 26.0–34.0)
MCHC: 33.3 g/dL (ref 30.0–36.0)
MCV: 85.2 fL (ref 80.0–100.0)
Monocytes Absolute: 0.6 10*3/uL (ref 0.1–1.0)
Monocytes Relative: 14 %
Neutro Abs: 2 10*3/uL (ref 1.7–7.7)
Neutrophils Relative %: 46 %
Platelets: 201 10*3/uL (ref 150–400)
RBC: 5.08 MIL/uL (ref 4.22–5.81)
RDW: 14.3 % (ref 11.5–15.5)
WBC: 4.5 10*3/uL (ref 4.0–10.5)
nRBC: 0 % (ref 0.0–0.2)

## 2018-05-23 LAB — COMPREHENSIVE METABOLIC PANEL
ALT: 14 U/L (ref 0–44)
AST: 22 U/L (ref 15–41)
Albumin: 4.1 g/dL (ref 3.5–5.0)
Alkaline Phosphatase: 56 U/L (ref 38–126)
Anion gap: 10 (ref 5–15)
BUN: 26 mg/dL — ABNORMAL HIGH (ref 8–23)
CO2: 29 mmol/L (ref 22–32)
Calcium: 9.3 mg/dL (ref 8.9–10.3)
Chloride: 97 mmol/L — ABNORMAL LOW (ref 98–111)
Creatinine, Ser: 1.17 mg/dL (ref 0.61–1.24)
GFR calc Af Amer: >60 mL/min (ref >60)
GFR calc non Af Amer: >60 mL/min (ref >60)
Glucose, Bld: 103 mg/dL — ABNORMAL HIGH (ref 70–99)
Potassium: 3.4 mmol/L — ABNORMAL LOW (ref 3.5–5.1)
Sodium: 136 mmol/L (ref 135–145)
Total Bilirubin: 0.5 mg/dL (ref 0.3–1.2)
Total Protein: 8.2 g/dL — ABNORMAL HIGH (ref 6.5–8.1)

## 2018-05-23 NOTE — Progress Notes (Signed)
[facility]     [address], [address]     [place], [place] [address]     Phone: [phone]      Fax: [phone]        Clinic day:  05/23/2018   Chief Complaint: Ruben Diaz is a 73 y.o. male with stage IA adenocarcinoma of the RUL who is seen for new patient assessment.  HPI:  The patient has a 35 pack year smoking history.  He no longer smokes.  Low dose chest CT scan on 04/18/2017 revealed several pulmonary nodules in the lungs, however, there were 2 new nodules in the anterior aspect of the right upper lobe. One nodule was a 12.9 mm non solid lesion with multiple internal cystic areas. The most concerning lesion was a solid 16.9 mm lesion in the anterior aspect of the right upper lobe near the apex just above the other previously described lesion.  PET scan on 05/04/2017 revealed a markedly hypermetabolic 19 mm right upper lobe pulmonary lesion c/w primary lung neoplasm. There were no enlarged or hypermetabolic mediastinal/hilar lymph nodes.  There was a single (poorly visualized on CT) hypermetabolic hepatic lesion near the gallbladder fossa.   Liver MRI on 05/12/2017 revealed no evidence of hepatic or other abdominal metastatic disease.  CT guided RUL biopsy on 06/01/2017 revealed adenocarcinoma, acinar and solid pattern.  He declined definitive surgery.  FEV1 and DLCO of 60%.  He received 4 cycles of carboplatin and Alimta (06/14/2017 - 08/16/2017).  Chest CT on 08/12/2017 revealed decrease in size of right upper lobe solid nodule (1.8 x 2.1 cm to 1.2 x 1.7 cm).  There was interval decrease in size of adjacent right upper lobe sub solid nodule (1.3 x 0.9 cm to 1.0 x 0.7 cm).  There was interval development of multiple tiny pulmonary nodules within the peripheral right lower lobe. Findings were likely infectious/inflammatory in etiology.  He received SBRT to the RUL nodule from 11/01/2017 - 11/13/2017.  He received 6000 cGy in 5  fractions.  Chest CT on 02/13/2018 revealed stable subpleural right upper lobe nodule. There was adjacent peribronchovascular soft tissue thickening without a discrete nodule.  There was interval clearing of peribronchovascular nodularity previously seen in the right lower lobe.  He last saw Dr Rogue Bussing on 02/21/2018.  Notes reviewed.  He denied any symptoms.  During the interim, he has felt "alright".  He notes chronic shortness of breath on exertion.  He has a chronic cough.  He denies any pain.  Weight is up 3 pounds.   Past Medical History:  Diagnosis Date  . Cancer ([place])   . Chronic kidney disease   . Dysphagia   . GERD (gastroesophageal reflux disease)   . History of BPH   . Hyperlipidemia   . Hypertension   . Myocardial infarction ([place])   . Peripheral vascular disease [facility])     Past Surgical History:  Procedure Laterality Date  . APPENDECTOMY    . COLONOSCOPY WITH PROPOFOL N/A 09/16/2016   Procedure: COLONOSCOPY WITH PROPOFOL;  Surgeon: [name], MD;  Location: [facility] ENDOSCOPY;  Service: Endoscopy;  Laterality: N/A;  . ESOPHAGOGASTRODUODENOSCOPY (EGD) WITH PROPOFOL N/A 09/16/2016   Procedure: ESOPHAGOGASTRODUODENOSCOPY (EGD) WITH PROPOFOL;  Surgeon: [name], MD;  Location: [facility] ENDOSCOPY;  Service: Endoscopy;  Laterality: N/A;  . ESOPHAGOGASTRODUODENOSCOPY (EGD) WITH PROPOFOL N/A 12/16/2016   Procedure: ESOPHAGOGASTRODUODENOSCOPY (EGD) WITH PROPOFOL;  Surgeon: [name], MD;  Location: [facility] ENDOSCOPY;  Service: Endoscopy;  Laterality: N/A;  . STOMACH SURGERY      No family history on file.  Social History:  reports that he quit smoking about 7 years ago. He has a 35.70 pack-year smoking history. He has never used smokeless tobacco. He reports that he does not drink alcohol or use drugs.  He is retired.  He worked in a [employer].  He denies any exposure to radiation or toxins. The patient is alone today.  Allergies:  Allergies  Allergen  Reactions  . Sucralfate Other (See Comments)    dizziness    Current Medications: Current Outpatient Medications  Medication Sig Dispense Refill  . amLODipine (NORVASC) 10 MG tablet TAKE 1 TABLET BY MOUTH ONCE DAILY 90 tablet 0  . aspirin EC 81 MG tablet Take 81 mg by mouth daily.    Marland Kitchen atorvastatin (LIPITOR) 10 MG tablet Take 10 mg by mouth daily.    . hydrochlorothiazide (HYDRODIURIL) 25 MG tablet Take 1 tablet (25 mg total) by mouth daily. 30 tablet 3  . pantoprazole (PROTONIX) 40 MG tablet Take 1 tablet by mouth daily.     No current facility-administered medications for this visit.     Review of Systems:  GENERAL:  Feels "alright".  No fevers, sweats.  Weight is up 3 pounds. PERFORMANCE STATUS (ECOG):  1 HEENT:  No visual changes, runny nose, sore throat, mouth sores or tenderness. Lungs:  Shortness of breath with exertion (no change).  Chronic cough.  No hemoptysis. Cardiac:  No chest pain, palpitations, orthopnea, or PND. GI:  No nausea, vomiting, diarrhea, constipation, melena or hematochezia.  No prior colonoscopy. GU:  No urgency, frequency, dysuria, or hematuria. Musculoskeletal:  No back pain.  No joint pain.  No muscle tenderness. Extremities:  No pain or swelling. Skin:  No rashes or skin changes. Neuro:  Left thumb numb once in a while.  No headache, focal weakness, balance or coordination issues. Endocrine:  No diabetes, thyroid issues, hot flashes or night sweats. Psych:  No mood changes, depression or anxiety. Pain:  No focal pain. Review of systems:  All other systems reviewed and found to be negative.  Physical Exam: Blood pressure 124/72, pulse 65, temperature 97.7 F (36.5 C), temperature source Oral, resp. rate 16, weight 212 lb 6.6 oz (96.3 kg), SpO2 100 %. GENERAL:  Well developed, well nourished, gentleman sitting comfortably in the exam room in no acute distress. MENTAL STATUS:  Alert and oriented to person, place and time. HEAD:  Wearing a cap.  Gray  beard.  Normocephalic, atraumatic, face symmetric, no Cushingoid features. EYES:  Brown eyes.  Pupils equal round and reactive to light and accomodation.  No conjunctivitis or scleral icterus. ENT:  Oropharynx clear without lesion.  Tongue normal. Mucous membranes moist.  RESPIRATORY:  Clear to auscultation without rales, wheezes or rhonchi. CARDIOVASCULAR:  Regular rate and rhythm without murmur, rub or gallop. ABDOMEN:  Soft, non-tender, with active bowel sounds, and no hepatosplenomegaly.  No masses. SKIN:  No rashes, ulcers or lesions. EXTREMITIES: No edema, no skin discoloration or tenderness.  No palpable cords. LYMPH NODES: No palpable cervical, supraclavicular, axillary or inguinal adenopathy  NEUROLOGICAL: Unremarkable. PSYCH:  Appropriate.   Imaging studies: 04/18/2017:  Low dose chest CT revealed several pulmonary nodules in the lungs, however, there were 2 new nodules in the anterior aspect of the right upper lobe. One nodule was a 12.9 mm non solid lesion with multiple internal cystic areas. The most concerning lesion was a solid 16.9 mm lesion in  the anterior aspect of the right upper lobe near the apex just above the other previously described lesion. 05/04/2017:  PET scan on 05/04/2017 revealed a markedly hypermetabolic 19 mm right upper lobe pulmonary lesion c/w primary lung neoplasm. There were no enlarged or hypermetabolic mediastinal/hilar lymph nodes.  There was a single (poorly visualized on CT) hypermetabolic hepatic lesion near the gallbladder fossa.  05/12/2017:  Liver MRI revealed no evidence of hepatic or other abdominal metastatic disease. 08/12/2017:  Chest CT revealed decrease in size of right upper lobe solid nodule (1.8 x 2.1 cm to 1.2 x 1.7 cm).  There was interval decrease in size of adjacent right upper lobe sub solid nodule (1.3 x 0.9 cm to 1.0 x 0.7 cm).  There was interval development of multiple tiny pulmonary nodules within the peripheral right lower lobe.  Findings were likely infectious/inflammatory in etiology. 02/13/2018:  Chest CT revealed stable subpleural right upper lobe nodule. There was adjacent peribronchovascular soft tissue thickening without a discrete nodule.  There was interval clearing of peribronchovascular nodularity previously seen in the right lower lobe.   Appointment on 05/23/2018  Component Date Value Ref Range Status  . Sodium 05/23/2018 136  135 - 145 mmol/L Final  . Potassium 05/23/2018 3.4* 3.5 - 5.1 mmol/L Final  . Chloride 05/23/2018 97* 98 - 111 mmol/L Final  . CO2 05/23/2018 29  22 - 32 mmol/L Final  . Glucose, Bld 05/23/2018 103* 70 - 99 mg/dL Final  . BUN 05/23/2018 26* 8 - 23 mg/dL Final  . Creatinine, Ser 05/23/2018 1.17  0.61 - 1.24 mg/dL Final  . Calcium 05/23/2018 9.3  8.9 - 10.3 mg/dL Final  . Total Protein 05/23/2018 8.2* 6.5 - 8.1 g/dL Final  . Albumin 05/23/2018 4.1  3.5 - 5.0 g/dL Final  . AST 05/23/2018 22  15 - 41 U/L Final  . ALT 05/23/2018 14  0 - 44 U/L Final  . Alkaline Phosphatase 05/23/2018 56  38 - 126 U/L Final  . Total Bilirubin 05/23/2018 0.5  0.3 - 1.2 mg/dL Final  . GFR calc non Af Amer 05/23/2018 >60  >60 mL/min Final  . GFR calc Af Amer 05/23/2018 >60  >60 mL/min Final  . Anion gap 05/23/2018 10  5 - 15 Final   Performed at [facility], [address], [place], [place] [address]  . WBC 05/23/2018 4.5  4.0 - 10.5 K/uL Final  . RBC 05/23/2018 5.08  4.22 - 5.81 MIL/uL Final  . Hemoglobin 05/23/2018 14.4  13.0 - 17.0 g/dL Final  . HCT 05/23/2018 43.3  39.0 - 52.0 % Final  . MCV 05/23/2018 85.2  80.0 - 100.0 fL Final  . MCH 05/23/2018 28.3  26.0 - 34.0 pg Final  . MCHC 05/23/2018 33.3  30.0 - 36.0 g/dL Final  . RDW 05/23/2018 14.3  11.5 - 15.5 % Final  . Platelets 05/23/2018 201  150 - 400 K/uL Final  . nRBC 05/23/2018 0.0  0.0 - 0.2 % Final  . Neutrophils Relative % 05/23/2018 46  % Final  . Neutro Abs 05/23/2018 2.0  1.7 - 7.7 K/uL Final  . Lymphocytes Relative  05/23/2018 36  % Final  . Lymphs Abs 05/23/2018 1.6  0.7 - 4.0 K/uL Final  . Monocytes Relative 05/23/2018 14  % Final  . Monocytes Absolute 05/23/2018 0.6  0.1 - 1.0 K/uL Final  . Eosinophils Relative 05/23/2018 3  % Final  . Eosinophils Absolute 05/23/2018 0.2  0.0 - 0.5 K/uL Final  .  Basophils Relative 05/23/2018 1  % Final  . Basophils Absolute 05/23/2018 0.1  0.0 - 0.1 K/uL Final  . Immature Granulocytes 05/23/2018 0  % Final  . Abs Immature Granulocytes 05/23/2018 0.01  0.00 - 0.07 K/uL Final   Performed at [facility], [address], [place], [place] [address]    Assessment:  Ruben Diaz is a 73 y.o. male with stage IA RUL lung cancer s/p CT guided biopsy on 06/01/2017.  Pathology revealed adenocarcinoma, acinar and solid pattern.  PET scan on 05/04/2017 revealed a markedly hypermetabolic 19 mm right upper lobe pulmonary lesion c/w primary lung neoplasm. There were no enlarged or hypermetabolic mediastinal/hilar lymph nodes.  There was a single (poorly visualized on CT) hypermetabolic hepatic lesion near the gallbladder fossa.   Liver MRI on 05/12/2017 revealed no evidence of metastatic disease.  He declined surgery.  He received 4 cycles of carboplatin and Alimta (06/14/2017 - 08/16/2017).  He received SBRT from 11/01/2017 - 11/13/2017.  He received 6000 cGy in 5 fractions.  Chest CT on 02/13/2018 revealed stable subpleural right upper lobe nodule. There was adjacent peribronchovascular soft tissue thickening without a discrete nodule.  There was interval clearing of peribronchovascular nodularity previously seen in the right lower lobe.  Symptomatically, he notes shortness of breath with exertion.  He has a chronic cough.  Exam is unremarkable.  Plan: 1.   Labs today:  CBC with diff, CMP. 2.   Stage IA RUL adenocarcinoma of the lung  Discuss diagnosis, staging, and management of cancer to date.  Discuss plans for surveillance chest CT every 6 months. 3.    Schedule chest CT on 08/14/2018. 4.   RTC after chest CT for MD assessment, labs (CBC with diff, CMP), and review of chest CT.  I discussed the assessment and treatment plan with the patient.  The patient was provided an opportunity to ask questions and all were answered.  The patient agreed with the plan and demonstrated an understanding of the instructions.  The patient was advised to call back or seek an in person evaluation if the symptoms worsen or if the condition fails to improve as anticipated.    Lequita Asal, MD  05/23/2018, 10:38 AM

## 2018-05-23 NOTE — Progress Notes (Signed)
Pt here for follow up. Previous Dr. Jacinto Reap. Patient. Denies any concerns at this time.

## 2018-06-22 ENCOUNTER — Other Ambulatory Visit: Payer: Self-pay | Admitting: *Deleted

## 2018-06-23 MED ORDER — HYDROCHLOROTHIAZIDE 25 MG PO TABS: 25.0000 mg | ORAL_TABLET | Freq: Every day | ORAL | 3 refills | Status: AC

## 2018-07-06 ENCOUNTER — Ambulatory Visit: Payer: Medicare Other | Admitting: Radiation Oncology

## 2018-08-14 ENCOUNTER — Inpatient Hospital Stay: Payer: Medicare Other | Attending: Hematology and Oncology

## 2018-08-14 ENCOUNTER — Other Ambulatory Visit: Payer: Self-pay

## 2018-08-14 ENCOUNTER — Ambulatory Visit
Admission: RE | Admit: 2018-08-14 | Discharge: 2018-08-14 | Disposition: A | Discharge status: Home / Self Care | Payer: Medicare Other | Source: Ambulatory Visit | Attending: Hematology and Oncology | Admitting: Hematology and Oncology

## 2018-08-14 DIAGNOSIS — I252 Old myocardial infarction: Secondary | ICD-10-CM | POA: Insufficient documentation

## 2018-08-14 DIAGNOSIS — I2510 Atherosclerotic heart disease of native coronary artery without angina pectoris: Secondary | ICD-10-CM | POA: Diagnosis not present

## 2018-08-14 DIAGNOSIS — E785 Hyperlipidemia, unspecified: Secondary | ICD-10-CM | POA: Insufficient documentation

## 2018-08-14 DIAGNOSIS — Z79899 Other long term (current) drug therapy: Secondary | ICD-10-CM | POA: Insufficient documentation

## 2018-08-14 DIAGNOSIS — N189 Chronic kidney disease, unspecified: Secondary | ICD-10-CM | POA: Diagnosis not present

## 2018-08-14 DIAGNOSIS — I700 Atherosclerosis of aorta: Secondary | ICD-10-CM | POA: Insufficient documentation

## 2018-08-14 DIAGNOSIS — J432 Centrilobular emphysema: Secondary | ICD-10-CM | POA: Diagnosis not present

## 2018-08-14 DIAGNOSIS — K219 Gastro-esophageal reflux disease without esophagitis: Secondary | ICD-10-CM | POA: Diagnosis not present

## 2018-08-14 DIAGNOSIS — Z87891 Personal history of nicotine dependence: Secondary | ICD-10-CM | POA: Diagnosis not present

## 2018-08-14 DIAGNOSIS — N400 Enlarged prostate without lower urinary tract symptoms: Secondary | ICD-10-CM | POA: Insufficient documentation

## 2018-08-14 DIAGNOSIS — C3411 Malignant neoplasm of upper lobe, right bronchus or lung: Secondary | ICD-10-CM | POA: Insufficient documentation

## 2018-08-14 DIAGNOSIS — I129 Hypertensive chronic kidney disease with stage 1 through stage 4 chronic kidney disease, or unspecified chronic kidney disease: Secondary | ICD-10-CM | POA: Insufficient documentation

## 2018-08-14 DIAGNOSIS — I739 Peripheral vascular disease, unspecified: Secondary | ICD-10-CM | POA: Insufficient documentation

## 2018-08-14 LAB — COMPREHENSIVE METABOLIC PANEL
ALT: 14 U/L (ref 0–44)
AST: 21 U/L (ref 15–41)
Albumin: 4 g/dL (ref 3.5–5.0)
Alkaline Phosphatase: 47 U/L (ref 38–126)
Anion gap: 10 (ref 5–15)
BUN: 28 mg/dL — ABNORMAL HIGH (ref 8–23)
CO2: 27 mmol/L (ref 22–32)
Calcium: 9.1 mg/dL (ref 8.9–10.3)
Chloride: 98 mmol/L (ref 98–111)
Creatinine, Ser: 1.14 mg/dL (ref 0.61–1.24)
GFR calc Af Amer: >60 mL/min (ref >60)
GFR calc non Af Amer: >60 mL/min (ref >60)
Glucose, Bld: 97 mg/dL (ref 70–99)
Potassium: 3.5 mmol/L (ref 3.5–5.1)
Sodium: 135 mmol/L (ref 135–145)
Total Bilirubin: 1.2 mg/dL (ref 0.3–1.2)
Total Protein: 8.2 g/dL — ABNORMAL HIGH (ref 6.5–8.1)

## 2018-08-14 LAB — CBC WITH DIFFERENTIAL/PLATELET
Abs Immature Granulocytes: 0.02 10*3/uL (ref 0.00–0.07)
Basophils Absolute: 0 10*3/uL (ref 0.0–0.1)
Basophils Relative: 1 %
Eosinophils Absolute: 0.2 10*3/uL (ref 0.0–0.5)
Eosinophils Relative: 3 %
HCT: 42 % (ref 39.0–52.0)
Hemoglobin: 13.8 g/dL (ref 13.0–17.0)
Immature Granulocytes: 0 %
Lymphocytes Relative: 27 %
Lymphs Abs: 1.6 10*3/uL (ref 0.7–4.0)
MCH: 27.9 pg (ref 26.0–34.0)
MCHC: 32.9 g/dL (ref 30.0–36.0)
MCV: 85 fL (ref 80.0–100.0)
Monocytes Absolute: 0.8 10*3/uL (ref 0.1–1.0)
Monocytes Relative: 13 %
Neutro Abs: 3.3 10*3/uL (ref 1.7–7.7)
Neutrophils Relative %: 56 %
Platelets: 221 10*3/uL (ref 150–400)
RBC: 4.94 MIL/uL (ref 4.22–5.81)
RDW: 14.7 % (ref 11.5–15.5)
WBC: 5.9 10*3/uL (ref 4.0–10.5)
nRBC: 0 % (ref 0.0–0.2)

## 2018-08-14 MED ORDER — IOHEXOL 300 MG/ML  SOLN
75.0000 mL | Freq: Once | INTRAMUSCULAR | Status: AC | PRN
  Administered 2018-08-14: 11:00:00 75 mL via INTRAVENOUS

## 2018-08-15 NOTE — Progress Notes (Addendum)
[facility]  [address], [address] [place], [place] [address] Phone: [phone]  Fax: [phone]   Telemedicine Office Visit:  08/16/2018  Referring physician: Baxter Hire, MD  I connected with Ruben Diaz on 08/16/2018 at 9:03 AM by videoconferencing and verified that I was speaking with the correct person using 2 identifiers.  The patient was at home.  I discussed the limitations, risk, security and privacy concerns of performing an evaluation and management service by videoconferencing and the availability of in person appointments.  I also discussed with the patient that there may be a patient responsible charge related to this service.  The patient expressed understanding and agreed to proceed.   Chief Complaint: Ruben Diaz is a 73 y.o. male with stage IA adenocarcinoma of the RUL who is seen for review of interval chest CT and 3 month assessment.  HPI: The patient was last seen in the oncology clinic on 05/23/2018. At that time, he noted chronic shortness of breath with exertion.  He had a chronic cough.  Weight was up.  He denied any pain.  Exam was unremarkable.  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 There were no findings to suggest local recurrence of disease or definite metastatic disease in the thorax. There was mild diffuse bronchial wall thickening with mild centrilobular and paraseptal emphysema suggestive of underlying COPD. There was aortic atherosclerosis, in addition to left main and 3 vessel coronary artery disease.   During the interim, the patient is doing well. He denies chest pain or discomfort. He reports a cough and shortness of breath with exertion.   He recently started to have numbness in his right foot, worse when he sits on the floor. It is not present when he sits in a chair or  ambulates.   His weight at home today is 211lbs today, down 1 pound from his last clinic visit.    Past Medical History:  Diagnosis Date   Cancer ([place])    Chronic kidney disease    Dysphagia    GERD (gastroesophageal reflux disease)    History of BPH    Hyperlipidemia    Hypertension    Myocardial infarction [facility])    Peripheral vascular disease ([place])     Past Surgical History:  Procedure Laterality Date   APPENDECTOMY     COLONOSCOPY WITH PROPOFOL N/A 09/16/2016   Procedure: COLONOSCOPY WITH PROPOFOL;  Surgeon: [name], MD;  Location: [facility] ENDOSCOPY;  Service: Endoscopy;  Laterality: N/A;   ESOPHAGOGASTRODUODENOSCOPY (EGD) WITH PROPOFOL N/A 09/16/2016   Procedure: ESOPHAGOGASTRODUODENOSCOPY (EGD) WITH PROPOFOL;  Surgeon: [name], MD;  Location: [facility] ENDOSCOPY;  Service: Endoscopy;  Laterality: N/A;   ESOPHAGOGASTRODUODENOSCOPY (EGD) WITH PROPOFOL N/A 12/16/2016   Procedure: ESOPHAGOGASTRODUODENOSCOPY (EGD) WITH PROPOFOL;  Surgeon: [name], MD;  Location: [facility] ENDOSCOPY;  Service: Endoscopy;  Laterality: N/A;   STOMACH SURGERY      History reviewed. No pertinent family history.  Social History:  reports that he quit smoking about 7 years ago. He has a 35.70 pack-year smoking history. He has never used smokeless tobacco. He reports that he does not drink alcohol or use drugs.   He is retired.  He worked in a [employer].  He denies any exposure to radiation or toxins. The patient is accompanied by a friend, Ruben Diaz, and his granddaughter, Ruben Diaz, today.  Participants in  the patient's visit and their role in the encounter included the patient, his friend Ruben Diaz, his granddaughter Ruben Diaz, and Vito Berger, CMA, today.  The intake visit was provided by Vito Berger, CMA.  Allergies:  Allergies  Allergen Reactions   Sucralfate Other (See Comments)    dizziness    Current Medications: Current Outpatient Medications  Medication  Sig Dispense Refill   amLODipine (NORVASC) 10 MG tablet TAKE 1 TABLET BY MOUTH ONCE DAILY 90 tablet 0   aspirin EC 81 MG tablet Take 81 mg by mouth daily.     atorvastatin (LIPITOR) 10 MG tablet Take 10 mg by mouth daily.     hydrochlorothiazide (HYDRODIURIL) 25 MG tablet Take 1 tablet (25 mg total) by mouth daily. 30 tablet 3   pantoprazole (PROTONIX) 40 MG tablet Take 1 tablet by mouth daily.     No current facility-administered medications for this visit.     Review of Systems  Constitutional: Positive for weight loss (1lb). Negative for chills, fever and malaise/fatigue.       Feels "alright".  HENT: Positive for hearing loss. Negative for congestion, ear pain, nosebleeds, sinus pain and sore throat.   Eyes: Negative for blurred vision, double vision and photophobia.  Respiratory: Positive for cough (chronic) and shortness of breath (chronic). Negative for hemoptysis and wheezing.   Cardiovascular: Negative.  Negative for chest pain, palpitations, orthopnea, leg swelling and PND.  Gastrointestinal: Negative.  Negative for abdominal pain, blood in stool, constipation, diarrhea, heartburn, melena, nausea and vomiting.  Genitourinary: Negative.  Negative for dysuria, hematuria and urgency.  Musculoskeletal: Negative.  Negative for back pain, joint pain and myalgias.  Skin: Negative.  Negative for itching and rash.  Neurological: Positive for sensory change (numbness in left thumb and right foot). Negative for dizziness, tingling, speech change, focal weakness, weakness and headaches.  Endo/Heme/Allergies: Negative.  Does not bruise/bleed easily.  Psychiatric/Behavioral: Negative.  Negative for depression and memory loss. The patient is not nervous/anxious and does not have insomnia.   All other systems reviewed and are negative.   Performance status (ECOG):  1  Physical Exam  Constitutional: He is oriented to person, place, and time. He appears well-developed and well-nourished. No  distress.  HENT:  Head: Atraumatic.  Male pattern baldness.  Gray beard.  Eyes: Pupils are equal, round, and reactive to light. Conjunctivae and EOM are normal.  Brown eyes.  Neurological: He is oriented to person, place, and time.  Skin: He is not diaphoretic.  Psychiatric: He has a normal mood and affect. His behavior is normal. Judgment and thought content normal.  Nursing note reviewed.    Imaging studies: 04/18/2017:  Low dose chest CT revealed several pulmonary nodules in the lungs, however, there were 2 new nodules in the anterior aspect of the right upper lobe. One nodule was a 12.9 mm non solid lesion with multiple internal cystic areas. The most concerning lesion was a solid 16.9 mm lesion in the anterior aspect of the right upper lobe near the apex just above the other previously described lesion. 05/04/2017:  PET scan on 05/04/2017 revealed a markedly hypermetabolic 19 mm right upper lobe pulmonary lesion c/w primary lung neoplasm. There were no enlarged or hypermetabolic mediastinal/hilar lymph nodes.  There was a single (poorly visualized on CT) hypermetabolic hepatic lesion near the gallbladder fossa.  05/12/2017:  Liver MRI revealed no evidence of hepatic or other abdominal metastatic disease. 08/12/2017:  Chest CT revealed decrease in size of right upper lobe solid nodule (1.8 x  2.1 cm to 1.2 x 1.7 cm).  There was interval decrease in size of adjacent right upper lobe sub solid nodule (1.3 x 0.9 cm to 1.0 x 0.7 cm).  There was interval development of multiple tiny pulmonary nodules within the peripheral right lower lobe. Findings were likely infectious/inflammatory in etiology. 02/13/2018:  Chest CT revealed stable subpleural right upper lobe nodule. There was adjacent peribronchovascular soft tissue thickening without a discrete nodule.  There was interval clearing of peribronchovascular nodularity previously seen in the right lower lobe. 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 There were no findings to suggest local recurrence of disease or definite metastatic disease in the thorax. There was mild diffuse bronchial wall thickening with mild centrilobular and paraseptal emphysema suggestive of underlying COPD. There was aortic atherosclerosis, in addition to left main and 3 vessel coronary artery disease.    Appointment on 08/14/2018  Component Date Value Ref Range Status   Sodium 08/14/2018 135  135 - 145 mmol/L Final   Potassium 08/14/2018 3.5  3.5 - 5.1 mmol/L Final   Chloride 08/14/2018 98  98 - 111 mmol/L Final   CO2 08/14/2018 27  22 - 32 mmol/L Final   Glucose, Bld 08/14/2018 97  70 - 99 mg/dL Final   BUN 08/14/2018 28* 8 - 23 mg/dL Final   Creatinine, Ser 08/14/2018 1.14  0.61 - 1.24 mg/dL Final   Calcium 08/14/2018 9.1  8.9 - 10.3 mg/dL Final   Total Protein 08/14/2018 8.2* 6.5 - 8.1 g/dL Final   Albumin 08/14/2018 4.0  3.5 - 5.0 g/dL Final   AST 08/14/2018 21  15 - 41 U/L Final   ALT 08/14/2018 14  0 - 44 U/L Final   Alkaline Phosphatase 08/14/2018 47  38 - 126 U/L Final   Total Bilirubin 08/14/2018 1.2  0.3 - 1.2 mg/dL Final   GFR calc non Af Amer 08/14/2018 >60  >60 mL/min Final   GFR calc Af Amer 08/14/2018 >60  >60 mL/min Final   Anion gap 08/14/2018 10  5 - 15 Final   Performed at [facility], [address] [place], [place] [address]   WBC 08/14/2018 5.9  4.0 - 10.5 K/uL Final   RBC 08/14/2018 4.94  4.22 - 5.81 MIL/uL Final   Hemoglobin 08/14/2018 13.8  13.0 - 17.0 g/dL Final   HCT 08/14/2018 42.0  39.0 - 52.0 % Final   MCV 08/14/2018 85.0  80.0 - 100.0 fL Final   MCH 08/14/2018 27.9  26.0 - 34.0 pg Final   MCHC 08/14/2018 32.9  30.0 - 36.0 g/dL Final   RDW 08/14/2018 14.7  11.5 - 15.5 % Final   Platelets 08/14/2018 221  150 - 400 K/uL Final   nRBC 08/14/2018 0.0  0.0 - 0.2  % Final   Neutrophils Relative % 08/14/2018 56  % Final   Neutro Abs 08/14/2018 3.3  1.7 - 7.7 K/uL Final   Lymphocytes Relative 08/14/2018 27  % Final   Lymphs Abs 08/14/2018 1.6  0.7 - 4.0 K/uL Final   Monocytes Relative 08/14/2018 13  % Final   Monocytes Absolute 08/14/2018 0.8  0.1 - 1.0 K/uL Final   Eosinophils Relative 08/14/2018 3  % Final   Eosinophils Absolute 08/14/2018 0.2  0.0 - 0.5 K/uL Final   Basophils Relative 08/14/2018 1  % Final   Basophils Absolute 08/14/2018 0.0  0.0 -  0.1 K/uL Final   Immature Granulocytes 08/14/2018 0  % Final   Abs Immature Granulocytes 08/14/2018 0.02  0.00 - 0.07 K/uL Final   Performed at [facility], [address], [place], [place] [address]    Assessment:  Kasyn Rolph is a 73 y.o. male with stage IA RUL lung cancer s/p CT guided biopsy on 06/01/2017.  Pathology revealed adenocarcinoma, acinar and solid pattern.  PET scan on 05/04/2017 revealed a markedly hypermetabolic 19 mm right upper lobe pulmonary lesion c/w primary lung neoplasm. There were no enlarged or hypermetabolic mediastinal/hilar lymph nodes.  There was a single (poorly visualized on CT) hypermetabolic hepatic lesion near the gallbladder fossa.   Liver MRI on 05/12/2017 revealed no evidence of metastatic disease.  He declined surgery.  He received 4 cycles of carboplatin and Alimta (06/14/2017 - 08/16/2017).  He received SBRT from 11/01/2017 - 11/13/2017.  He received 6000 cGy in 5 fractions.  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 There were no findings to suggest local recurrence of disease or definite metastatic disease in the thorax. There was mild diffuse bronchial wall thickening with mild centrilobular and paraseptal emphysema suggestive of underlying COPD. There was aortic atherosclerosis, in addition to left  main and 3 vessel coronary artery disease.   Symptomatically, he is doing well.  He denies any change in his chronic cough.  Plan: 1.   Review labs from 08/14/2018. 2.   Stage IA RUL adenocarcinoma of the lung             Review interval chest CT.  Images personally reviewed.  No evidence of recurrent disease.  Provide patient a copy of his chest CT for his records.             Discuss plans for ongoing surveillance chest CT every 6 months.   Schedule chest CT on 02/14/2019. 3.   Right foot numbness  Patient notes right foot numbness when sitting on the floor.  No numbness when sitting in a chair or ambulating.  Discuss follow-up with Dr Edwina Barth. 4.   RTC in 6 months for MD assessment, labs (CBC with diff, CMP), and review of chest CT.  I discussed the assessment and treatment plan with the patient.  The patient was provided an opportunity to ask questions and all were answered.  The patient agreed with the plan and demonstrated an understanding of the instructions.  The patient was advised to call back or seek an in person evaluation if the symptoms worsen or if the condition fails to improve as anticipated.  I provided 11 minutes (9:03 AM - 9:14 AM) of face-to-face video visit time during this this encounter and > 50% was spent counseling as documented under my assessment and plan.  I provided these services from the [facility] office.   Nolon Stalls, MD, PhD  08/16/2018, 9:03 AM  I, Molly Dorshimer, am acting as Education administrator for [employer]. Mike Gip, MD, PhD.  I, Dabria Wadas C. Mike Gip, MD, have reviewed the above documentation for accuracy and completeness, and I agree with the above.

## 2018-08-16 ENCOUNTER — Encounter: Payer: Self-pay | Admitting: Hematology and Oncology

## 2018-08-16 ENCOUNTER — Inpatient Hospital Stay (HOSPITAL_BASED_OUTPATIENT_CLINIC_OR_DEPARTMENT_OTHER): Payer: Medicare Other | Admitting: Hematology and Oncology

## 2018-08-16 DIAGNOSIS — Z87891 Personal history of nicotine dependence: Secondary | ICD-10-CM | POA: Diagnosis not present

## 2018-08-16 DIAGNOSIS — Z79899 Other long term (current) drug therapy: Secondary | ICD-10-CM | POA: Diagnosis not present

## 2018-08-16 DIAGNOSIS — I10 Essential (primary) hypertension: Secondary | ICD-10-CM

## 2018-08-16 DIAGNOSIS — Z7982 Long term (current) use of aspirin: Secondary | ICD-10-CM

## 2018-08-16 DIAGNOSIS — C3411 Malignant neoplasm of upper lobe, right bronchus or lung: Secondary | ICD-10-CM | POA: Diagnosis not present

## 2018-08-16 NOTE — Progress Notes (Signed)
No new changes noted today. The patient Name, DOB and address has been verified by phone today.

## 2018-08-24 ENCOUNTER — Other Ambulatory Visit: Payer: Self-pay

## 2018-08-24 ENCOUNTER — Ambulatory Visit
Admission: RE | Admit: 2018-08-24 | Discharge: 2018-08-24 | Disposition: A | Discharge status: Home / Self Care | Payer: Medicare Other | Source: Ambulatory Visit | Attending: Radiation Oncology | Admitting: Radiation Oncology

## 2018-08-24 ENCOUNTER — Encounter: Payer: Self-pay | Admitting: Radiation Oncology

## 2018-08-24 VITALS — BP 142/72 | HR 67 | Temp 96.8°F | Resp 16 | Wt 210.2 lb

## 2018-08-24 DIAGNOSIS — Z923 Personal history of irradiation: Secondary | ICD-10-CM | POA: Diagnosis not present

## 2018-08-24 DIAGNOSIS — C3411 Malignant neoplasm of upper lobe, right bronchus or lung: Secondary | ICD-10-CM | POA: Insufficient documentation

## 2018-08-24 NOTE — Progress Notes (Signed)
Radiation Oncology Follow up Note  Name: Ruben Diaz   Date:   08/24/2018 MRN:  [id number] DOB: Feb 26, 1946    This 73 y.o. male presents to the clinic today for 93-month follow-up status post SBRT to his right upper lobe for stage I adenocarcinoma.  REFERRING PROVIDER: Baxter Hire, MD  HPI: Patient is a 73 year old male now out 21-month 7 completed SBRT to his right upper lobe for stage I adenocarcinoma.  Seen today in routine follow-up he is doing well.  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  No findings to suggest local recurrence of disease or metastatic disease.  COMPLICATIONS OF TREATMENT: none  FOLLOW UP COMPLIANCE: keeps appointments   PHYSICAL EXAM:  BP (!) 142/72 (BP Location: Left Arm, Patient Position: Sitting)   Pulse 67   Temp (!) 96.8 F (36 C) (Tympanic)   Resp 16   Wt 210 lb 3.3 oz (95.4 kg)   BMI 28.51 kg/m  Well-developed well-nourished patient in NAD. HEENT reveals PERLA, EOMI, discs not visualized.  Oral cavity is clear. No oral mucosal lesions are identified. Neck is clear without evidence of cervical or supraclavicular adenopathy. Lungs are clear to A&P. Cardiac examination is essentially unremarkable with regular rate and rhythm without murmur rub or thrill. Abdomen is benign with no organomegaly or masses noted. Motor sensory and DTR levels are equal and symmetric in the upper and lower extremities. Cranial nerves II through XII are grossly intact. Proprioception is intact. No peripheral adenopathy or edema is identified. No motor or sensory levels are noted. Crude visual fields are within normal range.  RADIOLOGY RESULTS: CT scans reviewed and compatible with above-stated findings  PLAN: At the present time patient is doing well with excellent response to SBRT treatment.  I am  pleased with his overall progress.  I have asked to see him back in 1 year for follow-up.  He continues close follow-up care with medical oncology.  Patient knows to call with any concerns at any time.  I would like to take this opportunity to thank you for allowing me to participate in the care of your patient.Noreene Filbert, MD

## 2018-09-15 ENCOUNTER — Other Ambulatory Visit: Payer: Self-pay | Admitting: Internal Medicine

## 2018-09-15 NOTE — Telephone Encounter (Signed)
Patient now under the care of Dr. Mike Gip. Script fwd to this team.

## 2018-09-18 ENCOUNTER — Telehealth: Payer: Self-pay

## 2018-09-18 NOTE — Telephone Encounter (Signed)
-----   Message from Lequita Asal, MD sent at 09/15/2018  2:04 PM EDT ----- Regarding: Please call patient  Amlodipine refill should be with his PCP.  M

## 2018-09-18 NOTE — Telephone Encounter (Signed)
Left message with family member requesting patient to call back.   This is regarding Amlodipine refill request. This is to be obtained by PCP per Dr. Mike Gip .

## 2018-10-20 ENCOUNTER — Telehealth: Payer: Self-pay

## 2018-10-20 NOTE — Telephone Encounter (Signed)
Telephone call to patient and left message to return my call about Survivorship Care Plan visit.  Family member returned call and states it is fine for me to mail patient SCP and Treatment Summary and I will call patient Aug. 5th at 2:00 to review SCP and discuss post cancer treatment.

## 2018-10-31 ENCOUNTER — Other Ambulatory Visit: Payer: Self-pay

## 2018-11-01 ENCOUNTER — Telehealth: Payer: Self-pay

## 2018-11-01 ENCOUNTER — Inpatient Hospital Stay: Payer: Medicare Other | Attending: Hematology and Oncology | Admitting: Oncology

## 2018-11-01 DIAGNOSIS — R5383 Other fatigue: Secondary | ICD-10-CM

## 2018-11-01 DIAGNOSIS — C3411 Malignant neoplasm of upper lobe, right bronchus or lung: Secondary | ICD-10-CM | POA: Diagnosis not present

## 2018-11-01 DIAGNOSIS — R5381 Other malaise: Secondary | ICD-10-CM

## 2018-11-01 DIAGNOSIS — R200 Anesthesia of skin: Secondary | ICD-10-CM | POA: Diagnosis not present

## 2018-11-01 NOTE — Progress Notes (Signed)
[facility]  [phone] [phone]  Patient Care Team: Baxter Hire, MD as PCP - General (Internal Medicine) Telford Nab, RN as Registered Nurse Rogue Bussing, Elisha Headland, MD as Consulting Physician (Internal Medicine) [name], MD as Referring Physician (Cardiothoracic Surgery) Noreene Filbert, MD as Referring Physician (Radiation Oncology) Lequita Asal, MD as Referring Physician (Hematology and Oncology)   Name of the patient: Levy Cedano  [id number]  July 08, 1945   Date of visit: 11/01/18  CLINIC:  Survivorship  Virtual Visit via Telephone Note  I connected with [name] on 11/02/18 at  2:00 PM EDT by telephone and verified that I am speaking with the correct person using two identifiers.  Location: Patient: Home Provider: Office   I discussed the limitations, risks, security and privacy concerns of performing an evaluation and management service by telephone and the availability of in person appointments. I also discussed with the patient that there may be a patient responsible charge related to this service. The patient expressed understanding and agreed to proceed.  REASON FOR VISIT:  Survivorship Care Plan visit & to address acute survivorship needs   BRIEF ONCOLOGY HISTORY: Oncology History Overview Note  #  RUL [place] Ca-~ 19 mm in size  with SUV of 21 [LDCT];- ADENOCARCINOMA, ACINAR AND SOLID PATTERN. Stage I [T1No] vs stage II [T3N0]  # march 19th- CARBO-ALIMTA q 3 wx4; PR; declined surgery; Aug 2019- SBRT [finished aug 20th, 2019]  # 2019 Feb Liver lesion/uptake on PET - incidental- MRI liver -NEG  DIAGNOSIS: [ MARCH 2019]  STAGE:  II  ;GOALS: curative  CURRENT/MOST RECENT THERAPY: [facility]   Primary cancer of right upper lobe of lung ([place])   INTERVAL HISTORY:    ADDITIONAL REVIEW OF SYSTEMS:  Review of Systems  Constitutional: Positive  for malaise/fatigue. Negative for chills, fever and weight loss.  HENT: Positive for hearing loss. Negative for congestion, ear pain and tinnitus.   Eyes: Negative.  Negative for blurred vision and double vision.  Respiratory: Negative.  Negative for cough, sputum production and shortness of breath.   Cardiovascular: Negative.  Negative for chest pain, palpitations and leg swelling.  Gastrointestinal: Negative.  Negative for abdominal pain, constipation, diarrhea, nausea and vomiting.  Genitourinary: Negative for dysuria, frequency and urgency.  Musculoskeletal: Negative for back pain and falls.  Skin: Negative.  Negative for rash.  Neurological: Negative.  Negative for weakness and headaches.  Endo/Heme/Allergies: Negative.  Does not bruise/bleed easily.  Psychiatric/Behavioral: Negative.  Negative for depression. The patient is not nervous/anxious and does not have insomnia.      PAST MEDICAL & SURGICAL HISTORY:  Past Medical History:  Diagnosis Date  . Cancer ([place])   . Chronic kidney disease   . Dysphagia   . GERD (gastroesophageal reflux disease)   . History of BPH   . Hyperlipidemia   . Hypertension   . Myocardial infarction ([place])   . Peripheral vascular disease [facility])    Past Surgical History:  Procedure Laterality Date  . APPENDECTOMY    . COLONOSCOPY WITH PROPOFOL N/A 09/16/2016   Procedure: COLONOSCOPY WITH PROPOFOL;  Surgeon: [name], MD;  Location: [facility] ENDOSCOPY;  Service: Endoscopy;  Laterality: N/A;  . ESOPHAGOGASTRODUODENOSCOPY (EGD) WITH PROPOFOL N/A 09/16/2016   Procedure: ESOPHAGOGASTRODUODENOSCOPY (EGD) WITH PROPOFOL;  Surgeon: [name], MD;  Location: [facility] ENDOSCOPY;  Service: Endoscopy;  Laterality: N/A;  . ESOPHAGOGASTRODUODENOSCOPY (EGD) WITH PROPOFOL N/A 12/16/2016   Procedure: ESOPHAGOGASTRODUODENOSCOPY (EGD) WITH PROPOFOL;  Surgeon: [name], MD;  Location: [facility] ENDOSCOPY;  Service: Endoscopy;  Laterality: N/A;  . STOMACH  SURGERY      SOCIAL HISTORY:   CURRENT MEDICATIONS:  Current Outpatient Medications on File Prior to Visit  Medication Sig Dispense Refill  . amLODipine (NORVASC) 10 MG tablet TAKE 1 TABLET BY MOUTH ONCE DAILY 90 tablet 0  . aspirin EC 81 MG tablet Take 81 mg by mouth daily.    Marland Kitchen atorvastatin (LIPITOR) 10 MG tablet Take 10 mg by mouth daily.    . hydrochlorothiazide (HYDRODIURIL) 25 MG tablet Take 1 tablet (25 mg total) by mouth daily. 30 tablet 3  . pantoprazole (PROTONIX) 40 MG tablet Take 1 tablet by mouth daily.     No current facility-administered medications on file prior to visit.     ALLERGIES: Allergies  Allergen Reactions  . Sucralfate Other (See Comments)    dizziness     PHYSICAL EXAM:  There were no vitals filed for this visit. There were no vitals filed for this visit.  Limited d/t telephone visit  LABORATORY DATA:  None for this visit.   DIAGNOSTIC IMAGING:  None for this visit.   ASSESSMENT & PLAN:  Mr. Hillman is a pleasant 73 y.o. male/male with history of Stage Ia lung cancer , treated with 4 cycles of carbo/alimita completed treatment on November 15, 2017 followed by SBRT.  Patient presents to survivorship clinic today for survivorship care plan visit and to address any acute survivorship concerns since completing treatment.   1. Stage Ia RUL lung cancer: Today, he received a copy of his [facility]) document, which was reviewed with him in detail.  The SCP details his cancer treatment history and potential late/long-term side effects of those treatments.  We discussed the follow-up schedule he can anticipate with interval imaging for surveillance of his cancer.  I have also shared a copy of his treatment summary/SCP with his PCP.  #. Problem at visit: HOH-patient requesting help with hearing aids.   I was able to get in touch with Elease Etienne, social worker who supplied me with a service that would be helpful for him.  He instructed me  to have the patient call the services for the deaf and hard of hearing.  The regional office is base in [place] and telephone number is [phone].  Information given to patient.  Otherwise, patient did not have any complaints today.  He has mild numbness in bilateral lower extremities but they are not bothersome to him.  He was evaluated by Dr. Mike Gip in May 2020 for follow-up after having a CT of his chest.  He was doing well and CT scan did not reveal any evidence of recurrent disease.  Plan was for him to return to clinic in approximately 6 months for repeat imaging and assessment.  #. Smoking cessation: I commended Mr. Spanos continued efforts to remain tobacco-free.  We discussed that one of the most important risk reduction strategies in preventing cancer recurrence in lung cancer patients is smoking cessation.  He is committed to abstaining from tobacco.    #. Physical activity/Healthy eating: Getting adequate physical activity and maintaining a healthy diet as a cancer survivor is important for overall wellness and reduces the risk of cancer recurrence. We discussed the [facility], which is a fitness program that is offered to cancer survivors free of charge.  We also reviewed "The Nutrition Rainbow" handout, as well as the American Cancer Society's booklet with recommendations  for nutrition and physical activity.    #. Health promotion/Cancer screening:  Mr. Andreoni is reportedly up-to-date on his colonoscopy, PSA tests, skin screenings, and vaccinations.  I encouraged him to talk with his PCP about arranging appropriate cancer screening tests, as appropriate.   #. Support services/Counseling: It is not uncommon for this period of the patient's cancer care trajectory to be one of many emotions and stressors.  Mr. Brummitt  was encouraged to take advantage of our many support services programs, support groups, and/or counseling in coping with her new life as a cancer  survivor after completing anti-cancer treatment. He was given a calendar of the cancer center's support services events, as well as brochures for our spiritual care and free counseling resources.    Dispo:  -Return to survivorship clinic as needed; no additional follow-up needed at this time.  -Consider transitioning the patient to long-term survivorship, when clinically appropriate.   I discussed the assessment and treatment plan with the patient. The patient was provided an opportunity to ask questions and all were answered. The patient agreed with the plan and demonstrated an understanding of the instructions.   The patient was advised to call back or seek an in-person evaluation if the symptoms worsen or if the condition fails to improve as anticipated.  I provided 15 minutes of non-face-to-face time during this encounter.  Faythe Casa, NP, AGNP-C [facility] (work cell) [phone] (office) 11/01/18 2:40 PM

## 2018-11-01 NOTE — Telephone Encounter (Signed)
Telephone call made to patients home and caregiver states patient just left.  Encouraged caregiver to ask patient to review SCP and treatment summary and if he has any questions to please call me.  My card is in packet  that was mailed to him last week with SCP and treatment summary.  Will reach out to patient again to review SCP and treatment summary

## 2019-02-13 NOTE — Progress Notes (Signed)
[facility]  [address] [place], [place] [address] Phone: [phone]  Fax: [phone]   Clinic Day:  02/16/2019  Referring physician: Baxter Hire, MD  Chief Complaint: Ruben Diaz is a 73 y.o. male with stage IA adenocarcinoma of the RUL who is seen for 6 month assessment.  HPI: The patient was last seen in the medical oncology clinic on 08/16/2018 via telemedicine. At that time, he was doing well. He denied any change in his chronic cough. CBC was unremarkable. We discussed ongoing surveillance chest CT every 6 months.   He was seen by Dr. Baruch Gouty on 08/24/2018. Patient had no cough hemoptysis or chest tightness. He was doing well. He will follow up in 1 year.    He was seen by Faythe Casa, NP for the survivorship clinic via telemedicine on 11/01/2018.  Patient felt fatigued.  Chest CT on 02/14/2019 revealed a stable post treatment changes of the right upper lobe. There was no evidence of recurrent or metastatic disease in the chest.   During the interim, he has felt "all right". He denies feeling fatigued. His energy level is good. He has shortness of breath every once in awhile. He notes difficultly hearing. He notes a chronic cough. He still has numbness in his left thumb and right foot.     Past Medical History:  Diagnosis Date  . Cancer ([place])   . Chronic kidney disease   . Dysphagia   . GERD (gastroesophageal reflux disease)   . History of BPH   . Hyperlipidemia   . Hypertension   . Myocardial infarction ([place])   . Peripheral vascular disease [facility])     Past Surgical History:  Procedure Laterality Date  . APPENDECTOMY    . COLONOSCOPY WITH PROPOFOL N/A 09/16/2016   Procedure: COLONOSCOPY WITH PROPOFOL;  Surgeon: [name], MD;  Location: [facility] ENDOSCOPY;  Service: Endoscopy;  Laterality: N/A;  . ESOPHAGOGASTRODUODENOSCOPY (EGD) WITH PROPOFOL N/A 09/16/2016   Procedure: ESOPHAGOGASTRODUODENOSCOPY (EGD)  WITH PROPOFOL;  Surgeon: [name], MD;  Location: [facility] ENDOSCOPY;  Service: Endoscopy;  Laterality: N/A;  . ESOPHAGOGASTRODUODENOSCOPY (EGD) WITH PROPOFOL N/A 12/16/2016   Procedure: ESOPHAGOGASTRODUODENOSCOPY (EGD) WITH PROPOFOL;  Surgeon: [name], MD;  Location: [facility] ENDOSCOPY;  Service: Endoscopy;  Laterality: N/A;  . STOMACH SURGERY      History reviewed. No pertinent family history.  Social History:  reports that he quit smoking about 7 years ago. He has a 35.70 pack-year smoking history. He has never used smokeless tobacco. He reports that he does not drink alcohol or use drugs. He is retired. He worked in a [employer]. He denies any exposure to radiation or toxins. He lives in [place].  The patient is alone today.  Allergies:  Allergies  Allergen Reactions  . Sucralfate Other (See Comments)    dizziness    Current Medications: Current Outpatient Medications  Medication Sig Dispense Refill  . amLODipine (NORVASC) 10 MG tablet TAKE 1 TABLET BY MOUTH ONCE DAILY 90 tablet 0  . aspirin EC 81 MG tablet Take 81 mg by mouth daily.    Marland Kitchen atorvastatin (LIPITOR) 10 MG tablet Take 10 mg by mouth daily.    . hydrochlorothiazide (HYDRODIURIL) 25 MG tablet Take 1 tablet (25 mg total) by mouth daily. 30 tablet 3  . pantoprazole (PROTONIX) 40 MG tablet Take 1 tablet by mouth daily.     No current facility-administered medications for this visit.     Review of Systems  Constitutional: Negative for chills, fever, malaise/fatigue and weight loss (up 2 pounds since 05/23/2018).       Feels "all right". Good energy level.  HENT: Positive for hearing loss. Negative for congestion, nosebleeds, sinus pain and sore throat.   Eyes: Negative.  Negative for blurred vision, double vision and photophobia.  Respiratory: Positive for cough (chronic) and shortness of breath (chronic). Negative for hemoptysis and wheezing.   Cardiovascular: Negative.  Negative for chest pain,  palpitations, orthopnea, leg swelling and PND.  Gastrointestinal: Negative.  Negative for abdominal pain, blood in stool, constipation, diarrhea, heartburn, melena, nausea and vomiting.  Genitourinary: Negative.  Negative for dysuria, hematuria and urgency.  Musculoskeletal: Negative.  Negative for back pain, joint pain and myalgias.  Skin: Negative.  Negative for itching and rash.  Neurological: Positive for sensory change (numbness in left thumb and right foot). Negative for dizziness, tingling, speech change, focal weakness, weakness and headaches.  Endo/Heme/Allergies: Negative.  Does not bruise/bleed easily.  Psychiatric/Behavioral: Negative.  Negative for depression and memory loss. The patient is not nervous/anxious and does not have insomnia.   All other systems reviewed and are negative.  Performance status (ECOG): 1  Vitals Blood pressure (!) 142/59, pulse 62, temperature (!) 97.1 F (36.2 C), temperature source Tympanic, resp. rate 18, height 5\' 11"  (1.803 m), weight 214 lb 6.4 oz (97.3 kg), SpO2 100 %.   Physical Exam  Constitutional: He is oriented to person, place, and time. He appears well-developed and well-nourished. No distress.  HENT:  Head: Normocephalic and atraumatic.  Mouth/Throat: Oropharynx is clear and moist. No oropharyngeal exudate.  Male pattern baldness.  Gray beard.  Mask.  Eyes: Pupils are equal, round, and reactive to light. Conjunctivae and EOM are normal. No scleral icterus.  Brown eyes.  Neck: Normal range of motion. Neck supple.  Cardiovascular: Normal rate, regular rhythm and normal heart sounds. Exam reveals no gallop.  No murmur heard. Pulmonary/Chest: Effort normal and breath sounds normal. No respiratory distress. He has no wheezes. He has no rales.  Abdominal: Soft. Bowel sounds are normal. He exhibits no distension and no mass. There is no abdominal tenderness. There is no rebound and no guarding.  Musculoskeletal: Normal range of motion.         General: No tenderness or edema.  Lymphadenopathy:    He has no cervical adenopathy.    He has no axillary adenopathy.       Right: No supraclavicular adenopathy present.       Left: No supraclavicular adenopathy present.  Neurological: He is alert and oriented to person, place, and time.  Skin: Skin is warm and dry. No rash noted. He is not diaphoretic. No erythema. No pallor.  Psychiatric: He has a normal mood and affect. His behavior is normal. Judgment and thought content normal.  Nursing note and vitals reviewed.   Imaging studies: 04/18/2017: Low dose chest CT revealed several pulmonary nodules in the lungs, however, therewere2 new nodules in the anterior aspect of the right upper lobe. One nodule wasa12.9 mmnon solid lesion with multiple internal cystic areas. The most concerning lesionwasa solid 16.9 mmlesion in the anterior aspect of the right upper lobe near the apex just above the other previously described lesion. 05/04/2017: PET scanon 05/04/2017 revealed a markedly hypermetabolic 19 mm right upper lobe pulmonary lesionc/wprimary lung neoplasm.There were no enlarged or hypermetabolic mediastinal/hilar lymph nodes.There was a single (poorly visualized on CT) hypermetabolic hepatic lesion near the gallbladder fossa. 05/12/2017: Liver MRI revealed no evidence of hepatic  or other abdominal metastatic disease. 08/12/2017: Chest CTrevealed decrease in size of right upper lobe solid nodule(1.8 x 2.1 cm to 1.2 x 1.7 cm).There was interval decrease in size of adjacent right upper lobe sub solid nodule(1.3 x 0.9 cm to 1.0 x 0.7 cm).There was interval development of multiple tiny pulmonary nodules within the peripheral right lower lobe. Findingswerelikely infectious/inflammatory in etiology. 02/13/2018: Chest CT revealed stable subpleural right upper lobe nodule.There was adjacent peribronchovascular soft tissue thickening without a discretenodule. There was  interval clearing of peribronchovascular nodularity previously seen in the right lower lobe. 08/14/2018:  Chest CT with contrast revealed stable post treatment related changes of the evolving radiation fibrosis in the anterior aspect of the right upper lobe. There were no findings to suggest local recurrence of disease or definite metastatic disease in the thorax. There was mild diffuse bronchial wall thickening with mild centrilobular and paraseptal emphysema suggestive of underlying COPD. There was aortic atherosclerosis, in addition to left main and 3 vessel coronary artery disease.  02/14/2019:  Chest CT revealed a stable post treatment changes of the right upper lobe. There was no evidence of recurrent or metastatic disease in the chest.    Appointment on 02/16/2019  Component Date Value Ref Range Status  . WBC 02/16/2019 4.4  4.0 - 10.5 K/uL Final  . RBC 02/16/2019 5.08  4.22 - 5.81 MIL/uL Final  . Hemoglobin 02/16/2019 14.1  13.0 - 17.0 g/dL Final  . HCT 02/16/2019 42.6  39.0 - 52.0 % Final  . MCV 02/16/2019 83.9  80.0 - 100.0 fL Final  . MCH 02/16/2019 27.8  26.0 - 34.0 pg Final  . MCHC 02/16/2019 33.1  30.0 - 36.0 g/dL Final  . RDW 02/16/2019 14.6  11.5 - 15.5 % Final  . Platelets 02/16/2019 219  150 - 400 K/uL Final  . nRBC 02/16/2019 0.0  0.0 - 0.2 % Final  . Neutrophils Relative % 02/16/2019 43  % Final  . Neutro Abs 02/16/2019 2.0  1.7 - 7.7 K/uL Final  . Lymphocytes Relative 02/16/2019 37  % Final  . Lymphs Abs 02/16/2019 1.6  0.7 - 4.0 K/uL Final  . Monocytes Relative 02/16/2019 14  % Final  . Monocytes Absolute 02/16/2019 0.6  0.1 - 1.0 K/uL Final  . Eosinophils Relative 02/16/2019 3  % Final  . Eosinophils Absolute 02/16/2019 0.1  0.0 - 0.5 K/uL Final  . Basophils Relative 02/16/2019 2  % Final  . Basophils Absolute 02/16/2019 0.1  0.0 - 0.1 K/uL Final  . Immature Granulocytes 02/16/2019 1  % Final  . Abs Immature Granulocytes 02/16/2019 0.02  0.00 - 0.07 K/uL Final    Performed at [facility], [address] [place], [place] [address]    Assessment:  Ruben Diaz is a 73 y.o. male with stage IA RUL lung cancers/p CT guided biopsy on 06/01/2017. Pathologyrevealed adenocarcinoma, acinar and solid pattern.  PET scanon 05/04/2017 revealed a markedly hypermetabolic 19 mm right upper lobe pulmonary lesionc/wprimary lung neoplasm.There were no enlarged or hypermetabolic mediastinal/hilar lymph nodes.There was a single (poorly visualized on CT) hypermetabolic hepatic lesion near the gallbladder fossa.Liver MRIon 05/12/2017 revealed no evidence of metastatic disease.  He declined surgery. He received 4 cycles of carboplatin and Alimta(06/14/2017 - 08/16/2017). He received SBRTfrom 11/01/2017 - 11/13/2017. He received6000 cGy in 5 fractions.  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 There were  no findings to suggest local recurrence of disease or definite metastatic disease in the thorax. There was mild diffuse bronchial wall thickening with mild centrilobular and paraseptal emphysema suggestive of underlying COPD. There was aortic atherosclerosis, in addition to left main and 3 vessel coronary artery disease.   Chest CT on 02/14/2019 revealed a stable post treatment changes of the right upper lobe. There was no evidence of recurrent or metastatic disease in the chest.   Symptomatically, he is doing well.  Exam is stable.  Plan: 1.   Labs today: CBC with diff, CMP. 2.Stage IA RUL adenocarcinoma of the lung Review chest CT from 02/14/2019.    Images personally reviewed   No evidence of recurrent disease.             Provide patient a copy of his chest CT for his records. Continue ongoing surveillance chest CT every 6 months.  Schedule chest CT on 08/14/2018. 3.     RTC  in 6 months for MD assessment, labs (CBC with diff, CMP) and review of chest CT.  I discussed the assessment and treatment plan with the patient.  The patient was provided an opportunity to ask questions and all were answered.  The patient agreed with the plan and demonstrated an understanding of the instructions.  The patient was advised to call back if the symptoms worsen or if the condition fails to improve as anticipated.   Lequita Asal, MD, PhD    02/16/2019, 9:12 AM  I, Selena Batten, am acting as scribe for [employer]. Mike Gip, MD, PhD.  I,  C. Mike Gip, MD, have reviewed the above documentation for accuracy and completeness, and I agree with the above.

## 2019-02-14 ENCOUNTER — Encounter: Payer: Self-pay | Admitting: Hematology and Oncology

## 2019-02-14 ENCOUNTER — Other Ambulatory Visit: Payer: Self-pay

## 2019-02-14 ENCOUNTER — Ambulatory Visit
Admission: RE | Admit: 2019-02-14 | Discharge: 2019-02-14 | Disposition: A | Discharge status: Home / Self Care | Payer: Medicare Other | Source: Ambulatory Visit | Attending: Hematology and Oncology | Admitting: Hematology and Oncology

## 2019-02-14 DIAGNOSIS — C3411 Malignant neoplasm of upper lobe, right bronchus or lung: Secondary | ICD-10-CM | POA: Insufficient documentation

## 2019-02-14 NOTE — Progress Notes (Signed)
No new changes noted today. The patient Name and DOB has been verified by phone today. 

## 2019-02-16 ENCOUNTER — Encounter: Payer: Self-pay | Admitting: Hematology and Oncology

## 2019-02-16 ENCOUNTER — Telehealth: Payer: Self-pay

## 2019-02-16 ENCOUNTER — Inpatient Hospital Stay: Payer: Medicare Other | Attending: Hematology and Oncology | Admitting: Hematology and Oncology

## 2019-02-16 ENCOUNTER — Inpatient Hospital Stay: Payer: Medicare Other

## 2019-02-16 ENCOUNTER — Other Ambulatory Visit: Payer: Self-pay

## 2019-02-16 VITALS — BP 142/59 | HR 62 | Temp 97.1°F | Resp 18 | Ht 71.0 in | Wt 214.4 lb

## 2019-02-16 DIAGNOSIS — J432 Centrilobular emphysema: Secondary | ICD-10-CM | POA: Insufficient documentation

## 2019-02-16 DIAGNOSIS — Z7982 Long term (current) use of aspirin: Secondary | ICD-10-CM | POA: Insufficient documentation

## 2019-02-16 DIAGNOSIS — Z85118 Personal history of other malignant neoplasm of bronchus and lung: Secondary | ICD-10-CM | POA: Insufficient documentation

## 2019-02-16 DIAGNOSIS — Z923 Personal history of irradiation: Secondary | ICD-10-CM | POA: Insufficient documentation

## 2019-02-16 DIAGNOSIS — C3411 Malignant neoplasm of upper lobe, right bronchus or lung: Secondary | ICD-10-CM

## 2019-02-16 DIAGNOSIS — Z9221 Personal history of antineoplastic chemotherapy: Secondary | ICD-10-CM | POA: Insufficient documentation

## 2019-02-16 DIAGNOSIS — R200 Anesthesia of skin: Secondary | ICD-10-CM | POA: Diagnosis not present

## 2019-02-16 DIAGNOSIS — N400 Enlarged prostate without lower urinary tract symptoms: Secondary | ICD-10-CM | POA: Diagnosis not present

## 2019-02-16 DIAGNOSIS — N189 Chronic kidney disease, unspecified: Secondary | ICD-10-CM | POA: Diagnosis not present

## 2019-02-16 DIAGNOSIS — Z79899 Other long term (current) drug therapy: Secondary | ICD-10-CM | POA: Insufficient documentation

## 2019-02-16 DIAGNOSIS — I739 Peripheral vascular disease, unspecified: Secondary | ICD-10-CM | POA: Diagnosis not present

## 2019-02-16 DIAGNOSIS — I2510 Atherosclerotic heart disease of native coronary artery without angina pectoris: Secondary | ICD-10-CM | POA: Diagnosis not present

## 2019-02-16 DIAGNOSIS — I129 Hypertensive chronic kidney disease with stage 1 through stage 4 chronic kidney disease, or unspecified chronic kidney disease: Secondary | ICD-10-CM | POA: Insufficient documentation

## 2019-02-16 DIAGNOSIS — E785 Hyperlipidemia, unspecified: Secondary | ICD-10-CM | POA: Diagnosis not present

## 2019-02-16 DIAGNOSIS — Z87891 Personal history of nicotine dependence: Secondary | ICD-10-CM | POA: Insufficient documentation

## 2019-02-16 DIAGNOSIS — K219 Gastro-esophageal reflux disease without esophagitis: Secondary | ICD-10-CM | POA: Diagnosis not present

## 2019-02-16 DIAGNOSIS — R5383 Other fatigue: Secondary | ICD-10-CM | POA: Insufficient documentation

## 2019-02-16 DIAGNOSIS — I252 Old myocardial infarction: Secondary | ICD-10-CM | POA: Insufficient documentation

## 2019-02-16 LAB — COMPREHENSIVE METABOLIC PANEL
ALT: 15 U/L (ref 0–44)
AST: 23 U/L (ref 15–41)
Albumin: 3.9 g/dL (ref 3.5–5.0)
Alkaline Phosphatase: 49 U/L (ref 38–126)
Anion gap: 7 (ref 5–15)
BUN: 24 mg/dL — ABNORMAL HIGH (ref 8–23)
CO2: 29 mmol/L (ref 22–32)
Calcium: 9.5 mg/dL (ref 8.9–10.3)
Chloride: 97 mmol/L — ABNORMAL LOW (ref 98–111)
Creatinine, Ser: 1.33 mg/dL — ABNORMAL HIGH (ref 0.61–1.24)
GFR calc Af Amer: >60 mL/min (ref >60)
GFR calc non Af Amer: 52 mL/min — ABNORMAL LOW (ref >60)
Glucose, Bld: 126 mg/dL — ABNORMAL HIGH (ref 70–99)
Potassium: 3.4 mmol/L — ABNORMAL LOW (ref 3.5–5.1)
Sodium: 133 mmol/L — ABNORMAL LOW (ref 135–145)
Total Bilirubin: 0.7 mg/dL (ref 0.3–1.2)
Total Protein: 7.7 g/dL (ref 6.5–8.1)

## 2019-02-16 LAB — CBC WITH DIFFERENTIAL/PLATELET
Abs Immature Granulocytes: 0.02 10*3/uL (ref 0.00–0.07)
Basophils Absolute: 0.1 10*3/uL (ref 0.0–0.1)
Basophils Relative: 2 %
Eosinophils Absolute: 0.1 10*3/uL (ref 0.0–0.5)
Eosinophils Relative: 3 %
HCT: 42.6 % (ref 39.0–52.0)
Hemoglobin: 14.1 g/dL (ref 13.0–17.0)
Immature Granulocytes: 1 %
Lymphocytes Relative: 37 %
Lymphs Abs: 1.6 10*3/uL (ref 0.7–4.0)
MCH: 27.8 pg (ref 26.0–34.0)
MCHC: 33.1 g/dL (ref 30.0–36.0)
MCV: 83.9 fL (ref 80.0–100.0)
Monocytes Absolute: 0.6 10*3/uL (ref 0.1–1.0)
Monocytes Relative: 14 %
Neutro Abs: 2 10*3/uL (ref 1.7–7.7)
Neutrophils Relative %: 43 %
Platelets: 219 10*3/uL (ref 150–400)
RBC: 5.08 MIL/uL (ref 4.22–5.81)
RDW: 14.6 % (ref 11.5–15.5)
WBC: 4.4 10*3/uL (ref 4.0–10.5)
nRBC: 0 % (ref 0.0–0.2)

## 2019-02-16 NOTE — Telephone Encounter (Signed)
Patient labs have been routed to the patient PCP office.

## 2019-03-31 ENCOUNTER — Other Ambulatory Visit: Payer: Self-pay

## 2019-03-31 DIAGNOSIS — Z79899 Other long term (current) drug therapy: Secondary | ICD-10-CM | POA: Diagnosis not present

## 2019-03-31 DIAGNOSIS — U071 COVID-19: Secondary | ICD-10-CM | POA: Diagnosis present

## 2019-03-31 DIAGNOSIS — I252 Old myocardial infarction: Secondary | ICD-10-CM | POA: Diagnosis not present

## 2019-03-31 DIAGNOSIS — N189 Chronic kidney disease, unspecified: Secondary | ICD-10-CM | POA: Diagnosis not present

## 2019-03-31 DIAGNOSIS — J22 Unspecified acute lower respiratory infection: Secondary | ICD-10-CM | POA: Insufficient documentation

## 2019-03-31 DIAGNOSIS — Z87891 Personal history of nicotine dependence: Secondary | ICD-10-CM | POA: Insufficient documentation

## 2019-03-31 DIAGNOSIS — Z7982 Long term (current) use of aspirin: Secondary | ICD-10-CM | POA: Diagnosis not present

## 2019-03-31 DIAGNOSIS — I129 Hypertensive chronic kidney disease with stage 1 through stage 4 chronic kidney disease, or unspecified chronic kidney disease: Secondary | ICD-10-CM | POA: Diagnosis not present

## 2019-03-31 LAB — BASIC METABOLIC PANEL
Anion gap: 14 (ref 5–15)
BUN: 30 mg/dL — ABNORMAL HIGH (ref 8–23)
CO2: 25 mmol/L (ref 22–32)
Calcium: 9 mg/dL (ref 8.9–10.3)
Chloride: 96 mmol/L — ABNORMAL LOW (ref 98–111)
Creatinine, Ser: 1.35 mg/dL — ABNORMAL HIGH (ref 0.61–1.24)
GFR calc Af Amer: 60 mL/min — ABNORMAL LOW (ref >60)
GFR calc non Af Amer: 51 mL/min — ABNORMAL LOW (ref >60)
Glucose, Bld: 122 mg/dL — ABNORMAL HIGH (ref 70–99)
Potassium: 3.4 mmol/L — ABNORMAL LOW (ref 3.5–5.1)
Sodium: 135 mmol/L (ref 135–145)

## 2019-03-31 LAB — CBC WITH DIFFERENTIAL/PLATELET
Abs Immature Granulocytes: 0.02 10*3/uL (ref 0.00–0.07)
Basophils Absolute: 0 10*3/uL (ref 0.0–0.1)
Basophils Relative: 1 %
Eosinophils Absolute: 0.1 10*3/uL (ref 0.0–0.5)
Eosinophils Relative: 1 %
HCT: 44.3 % (ref 39.0–52.0)
Hemoglobin: 14.5 g/dL (ref 13.0–17.0)
Immature Granulocytes: 1 %
Lymphocytes Relative: 27 %
Lymphs Abs: 1.1 10*3/uL (ref 0.7–4.0)
MCH: 27 pg (ref 26.0–34.0)
MCHC: 32.7 g/dL (ref 30.0–36.0)
MCV: 82.5 fL (ref 80.0–100.0)
Monocytes Absolute: 0.3 10*3/uL (ref 0.1–1.0)
Monocytes Relative: 7 %
Neutro Abs: 2.6 10*3/uL (ref 1.7–7.7)
Neutrophils Relative %: 63 %
Platelets: 252 10*3/uL (ref 150–400)
RBC: 5.37 MIL/uL (ref 4.22–5.81)
RDW: 13.5 % (ref 11.5–15.5)
WBC: 4.1 10*3/uL (ref 4.0–10.5)
nRBC: 0 % (ref 0.0–0.2)

## 2019-03-31 NOTE — ED Triage Notes (Signed)
Patient to ED via EMS from home for shortness of breath.  Patient family reported patient tested positive for COVID 3 days ago.  Patient is oriented only to himself.

## 2019-04-01 ENCOUNTER — Emergency Department: Payer: Medicare Other

## 2019-04-01 ENCOUNTER — Emergency Department
Admission: EM | Admit: 2019-04-01 | Discharge: 2019-04-01 | Disposition: A | Discharge status: Home / Self Care | Payer: Medicare Other | Attending: Emergency Medicine | Admitting: Emergency Medicine

## 2019-04-01 DIAGNOSIS — J22 Unspecified acute lower respiratory infection: Secondary | ICD-10-CM

## 2019-04-01 NOTE — ED Notes (Addendum)
This RN spoke with Forbes Cellar (friend) on file, who st increased confusion for 3 days. Drycough/sob for 3 days. Family called EMS due to increase SHOB.  Pt sat 94% on RA at this time.

## 2019-04-01 NOTE — ED Notes (Signed)
[place] alarm placed and turned on by this RN.

## 2019-04-01 NOTE — ED Notes (Addendum)
This RN attempted to contact [employer] w/o success. UA to leave a voicemail.

## 2019-04-01 NOTE — ED Notes (Signed)
This RN spoke with Forbes Cellar.

## 2019-04-01 NOTE — ED Notes (Addendum)
This RN spoke with daughter, Ervine Witucki [phone] regarding pt's DC. daughter will pick up pt in 3 85minutes.

## 2019-04-01 NOTE — ED Provider Notes (Signed)
[facility] Emergency Department Provider Note  ____________________________________________   First MD Initiated Contact with Patient 04/01/19 0014     (approximate)  I have reviewed the triage vital signs and the nursing notes.   HISTORY  Chief Complaint Shortness of Breath    HPI Ruben Diaz is a 74 y.o. male with medical history as listed below and with a Covid positive diagnosis about 3 days ago at [facility].  He presents tonight for evaluation of persistent or worsening cough and shortness of breath.  There is also report of increased confusion.  When I evaluated the patient, he is in no discomfort, lying in a mostly recumbent position with his arms behind his head, in no acute distress.  He denies pain.  He agrees that he has been having some shortness of breath and cough but says that it is not severe.  Nothing in particular makes it better and exertion makes it a little bit worse.  He says he has been trying to eat or drink more (this was mentioned at [facility]).  He has no weakness in his arms or his legs.  He denies feeling fever or chills.  He denies chest pain, abdominal pain, nausea, and vomiting.         Past Medical History:  Diagnosis Date  . Cancer ([place])   . Chronic kidney disease   . Dysphagia   . GERD (gastroesophageal reflux disease)   . History of BPH   . Hyperlipidemia   . Hypertension   . Myocardial infarction ([place])   . Peripheral vascular disease [facility])     Patient Active Problem List   Diagnosis Date Noted  . Dehydration 06/21/2017  . Hypokalemia 06/21/2017  . Primary cancer of right upper lobe of lung ([place]) 05/25/2017  . Nodule of upper lobe of right lung 05/06/2017  . Liver lesion 05/06/2017  . Hyperlipidemia, unspecified 05/02/2017  . Hypertension 05/02/2017  . Personal history of tobacco use, presenting hazards to health 04/07/2016    Past Surgical History:  Procedure Laterality Date  . APPENDECTOMY    .  COLONOSCOPY WITH PROPOFOL N/A 09/16/2016   Procedure: COLONOSCOPY WITH PROPOFOL;  Surgeon: [name], MD;  Location: [facility] ENDOSCOPY;  Service: Endoscopy;  Laterality: N/A;  . ESOPHAGOGASTRODUODENOSCOPY (EGD) WITH PROPOFOL N/A 09/16/2016   Procedure: ESOPHAGOGASTRODUODENOSCOPY (EGD) WITH PROPOFOL;  Surgeon: [name], MD;  Location: [facility] ENDOSCOPY;  Service: Endoscopy;  Laterality: N/A;  . ESOPHAGOGASTRODUODENOSCOPY (EGD) WITH PROPOFOL N/A 12/16/2016   Procedure: ESOPHAGOGASTRODUODENOSCOPY (EGD) WITH PROPOFOL;  Surgeon: [name], MD;  Location: [facility] ENDOSCOPY;  Service: Endoscopy;  Laterality: N/A;  . STOMACH SURGERY      Prior to Admission medications   Medication Sig Start Date End Date Taking? Authorizing Provider  amLODipine (NORVASC) 10 MG tablet TAKE 1 TABLET BY MOUTH ONCE DAILY 05/22/18   Cammie Sickle, MD  aspirin EC 81 MG tablet Take 81 mg by mouth daily.    [provider]  atorvastatin (LIPITOR) 10 MG tablet Take 10 mg by mouth daily.    [provider]  hydrochlorothiazide (HYDRODIURIL) 25 MG tablet Take 1 tablet (25 mg total) by mouth daily. 06/23/18   Cammie Sickle, MD  pantoprazole (PROTONIX) 40 MG tablet Take 1 tablet by mouth daily. 05/19/17   [provider]    Allergies Sucralfate  No family history on file.  Social History Social History   Tobacco Use  . Smoking status: Former Smoker    Packs/day:  0.70    Years: 51.00    Pack years: 35.70    Quit date: 2013    Years since quitting: 8.0  . Smokeless tobacco: Never Used  Substance Use Topics  . Alcohol use: No  . Drug use: No    Review of Systems Constitutional: No fever/chills Eyes: No visual changes. ENT: No sore throat. Cardiovascular: Denies chest pain. Respiratory: Shortness of breath and cough. Gastrointestinal: No abdominal pain.  No nausea, no vomiting.  No diarrhea.  No constipation. Genitourinary: Negative for  dysuria. Musculoskeletal: Negative for neck pain.  Negative for back pain. Integumentary: Negative for rash. Neurological: Increased confusion for 3 days per friend.  Negative for headaches, focal weakness or numbness.   ____________________________________________   PHYSICAL EXAM:  VITAL SIGNS: ED Triage Vitals  Enc Vitals Group     BP 03/31/19 2256 114/64     Pulse Rate 03/31/19 2256 95     Resp 03/31/19 2256 18     Temp 03/31/19 2256 98.6 F (37 C)     Temp Source 03/31/19 2256 Oral     SpO2 03/31/19 2256 95 %     Weight 03/31/19 2257 68 kg (150 lb)     Height 03/31/19 2257 1.829 m (6')     Head Circumference --      Peak Flow --      Pain Score 03/31/19 2256 0     Pain Loc --      Pain Edu? --      Excl. in [place]? --     Constitutional: Alert, comfortable, communicative without difficulty, oriented to person and location (emergency department). Eyes: Conjunctivae are normal.  Head: Atraumatic. Nose: No congestion/rhinnorhea. Mouth/Throat: Patient is wearing a mask. Neck: No stridor.  No meningeal signs.   Cardiovascular: Normal rate, regular rhythm. Good peripheral circulation. Grossly normal heart sounds. Respiratory: Normal respiratory effort.  No retractions. Gastrointestinal: Soft and nontender. No distention.  Musculoskeletal: No lower extremity tenderness nor edema. No gross deformities of extremities. Neurologic:  Normal speech and language. No gross focal neurologic deficits are appreciated.  Skin:  Skin is warm, dry and intact. Psychiatric: Mood and affect are normal. Speech and behavior are normal.  ____________________________________________   LABS (all labs ordered are listed, but only abnormal results are displayed)  Labs Reviewed  BASIC METABOLIC PANEL - Abnormal; Notable for the following components:      Result Value   Potassium 3.4 (*)    Chloride 96 (*)    Glucose, Bld 122 (*)    BUN 30 (*)    Creatinine, Ser 1.35 (*)    GFR calc non Af  Amer 51 (*)    GFR calc Af Amer 60 (*)    All other components within normal limits  CBC WITH DIFFERENTIAL/PLATELET   ____________________________________________  EKG  ED ECG REPORT I, Hinda Kehr, the attending physician, personally viewed and interpreted this ECG.  Date: 03/31/2019 EKG Time: 23: 09 Rate: 95 Rhythm: normal sinus rhythm QRS Axis: Left axis deviation Intervals: LVH ST/T Wave abnormalities: Non-specific ST segment / T-wave changes, but no clear evidence of acute ischemia. Narrative Interpretation: no definitive evidence of acute ischemia; does not meet STEMI criteria.   ____________________________________________  RADIOLOGY I, Hinda Kehr, personally viewed and evaluated these images (plain radiographs) as part of my medical decision making, as well as reviewing the written report by the radiologist.  ED MD interpretation: Patchy opacities consistent with COVID-19  Official radiology report(s): DG Chest Portable 1 View  Result  Date: 04/01/2019 CLINICAL DATA:  Shortness of breath. COVID positive 3 days ago. EXAM: PORTABLE CHEST 1 VIEW COMPARISON:  Chest CT 02/14/2019. FINDINGS: Patchy airspace disease in both lower lung zones, left greater than right. Right suprahilar scarring. The heart is normal in size. No pulmonary edema, pleural effusion or pneumothorax. No acute osseous abnormalities are seen. IMPRESSION: Patchy bibasilar airspace disease, pattern consistent with COVID pneumonia. Electronically Signed   By: Keith Rake M.D.   On: 04/01/2019 00:57    ____________________________________________   PROCEDURES   Procedure(s) performed (including Critical Care):  Procedures   ____________________________________________   INITIAL IMPRESSION / MDM / [place] / ED COURSE  As part of my medical decision making, I reviewed the following data within the [place] notes reviewed and incorporated, Labs reviewed ,  EKG interpreted , Old chart reviewed, Radiograph reviewed , Notes from prior ED visits and [place] Controlled Substance Database   Differential diagnosis includes, but is not limited to, DTOIZ-12 and complications, community-acquired pneumonia, delirium, dementia, sepsis.  Fortunately patient's vital signs are stable and within normal limits.  He is in absolutely no distress, joking with me, admits to some shortness of breath but is in no distress and not using accessory muscles.  No coughing observed during my assessment.  Labs are reassuring and in fact his creatinine has improved since he was at [facility] a few days ago (he had some mild acute kidney injury thought to be secondary to dehydration at that time).  He has had interval development of patchy airspace disease consistent with COVID-19 infection but he has no leukocytosis.  At this point I believe he probably does have a mild element of delirium secondary to his illness but I think this is only go to be exacerbated if he is brought unnecessarily into the hospital.  He meets no criteria for admission to the hospital based on his lung disease and COVID-19 infection, and confining him in a hospital, unfamiliar environment with multiple interruptions and lack of sleep, will likely only worsen his mental status.  Similarly, if I prescribe medications that will not clearly provide a benefit, such as azithromycin or steroids, I am concerned that this will only worsen his mental status and cause unintended side effects rather than actually being beneficial.  Even that he is in no distress, I will discharge him for close outpatient follow-up with his primary care doctor.  I gave my usual customary return precautions and the patient acknowledged that he would rather be home.          ____________________________________________  FINAL CLINICAL IMPRESSION(S) / ED DIAGNOSES  Final diagnoses:  Lower respiratory tract infection due to 2019 novel coronavirus      MEDICATIONS GIVEN DURING THIS VISIT:  Medications - No data to display   ED Discharge Orders    None      *Please note:  Ruben Diaz was evaluated in Emergency Department on 04/01/2019 for the symptoms described in the history of present illness. He was evaluated in the context of the global COVID-19 pandemic, which necessitated consideration that the patient might be at risk for infection with the SARS-CoV-2 virus that causes COVID-19. Institutional protocols and algorithms that pertain to the evaluation of patients at risk for COVID-19 are in a state of rapid change based on information released by regulatory bodies including the CDC and federal and state organizations. These policies and algorithms were followed during the patient's care in the ED.  Some ED  evaluations and interventions may be delayed as a result of limited staffing during the pandemic.*  Note:  This document was prepared using Dragon voice recognition software and may include unintentional dictation errors.   Hinda Kehr, MD 04/01/19 [id number]

## 2019-04-01 NOTE — Discharge Instructions (Signed)
As we discussed, although you have tested positive for COVID-19 (coronavirus), you do not need to be hospitalized at this time.  Read through all the included information including the recommendations from the CDC.  We recommend that you self-quarantine at home with your immediate family only (people with whom you have already been in contact) for 10-14 days after your fever has gone away (without taking medication to make your temperature come down, such as Tylenol (acetaminophen)), after your respiratory symptoms have improved, and after at least 14 days have passed since your symptoms first appeared.  You should have as minimal contact as possible with anyone else including close family as per the [facility] paperwork guidelines listed below. Follow-up with your doctor by phone or online as needed and return immediately to the emergency department or call 911 only if you develop new or worsening symptoms that concern you.  If you were prescribed any medications, please use them as instructed.  You can find up-to-date information about COVID-19 in [place] by calling the [place]: [phone]. You may also call 2-1-1, or [phone], or additional resources.  You can also find information online at [email or web address]-[email or web address]/, or on the Center for Disease Control (CDC) website at BeginnerSteps.be.

## 2019-05-16 ENCOUNTER — Ambulatory Visit (INDEPENDENT_AMBULATORY_CARE_PROVIDER_SITE_OTHER): Payer: Medicare Other | Admitting: Urology

## 2019-05-16 ENCOUNTER — Other Ambulatory Visit: Payer: Self-pay

## 2019-05-16 ENCOUNTER — Encounter: Payer: Self-pay | Admitting: Urology

## 2019-05-16 VITALS — BP 122/77 | HR 72 | Ht 71.0 in | Wt 199.0 lb

## 2019-05-16 DIAGNOSIS — R3911 Hesitancy of micturition: Secondary | ICD-10-CM | POA: Diagnosis not present

## 2019-05-16 DIAGNOSIS — R3129 Other microscopic hematuria: Secondary | ICD-10-CM

## 2019-05-16 DIAGNOSIS — N401 Enlarged prostate with lower urinary tract symptoms: Secondary | ICD-10-CM

## 2019-05-16 LAB — MICROSCOPIC EXAMINATION

## 2019-05-16 LAB — URINALYSIS, COMPLETE
Bilirubin, UA: NEGATIVE
Glucose, UA: NEGATIVE
Ketones, UA: NEGATIVE
Nitrite, UA: NEGATIVE
Protein,UA: NEGATIVE
Specific Gravity, UA: 1.025 (ref 1.005–1.030)
Urobilinogen, Ur: 1 mg/dL (ref 0.2–1.0)
pH, UA: 5.5 (ref 5.0–7.5)

## 2019-05-16 NOTE — Progress Notes (Signed)
05/16/2019 2:02 PM   Ruben Diaz 18-Feb-1946 [id number]  Referring provider: Baxter Hire, MD [place],  [place] [address]  Chief Complaint  Patient presents with  . Hematuria    new patient    HPI: 74 year old male referred for further evaluation of microscopic hematuria.  He is on microscopic blood in his urine, 4-10 red blood cells per high-power field on 3 separate occasions on 10/10/2018, 10/17/2018 and 04/13/2019.  These are in the absence of infection or any other worrisome features.  Notably, the patient is recovering well from COVID-19 infection earlier this year.  He does have acute kidney injury which was felt to be associated with his infection, creatinine 1.4 from previous baseline of ~1.0.    He did have a chest x-ray in 01/2019 which shows a 3 mm left upper pole nonobstructing stone versus vascular calcification as well as a right renal vascular calcification.  No recent cross-sectional imaging.  He has a personal history of hepatocellular carcinoma as well as a right upper lung malignancy followed by Dr. Mike Gip.  Former smoker, 35-pack-year history.  No history of gross hematuria.  No personal history of stones.  He does mention today that he occasionally has some hesitancy starting stream and feels like he is able to empty.  He gets up twice a night to void.  He is not particular bothered by this.  Most recent PSA 11/2010 1.9.  PMH: Past Medical History:  Diagnosis Date  . Cancer ([place])   . Chronic kidney disease   . Dysphagia   . GERD (gastroesophageal reflux disease)   . History of BPH   . Hyperlipidemia   . Hypertension   . Myocardial infarction ([place])   . Peripheral vascular disease [facility])     Surgical History: Past Surgical History:  Procedure Laterality Date  . APPENDECTOMY    . COLONOSCOPY WITH PROPOFOL N/A 09/16/2016   Procedure: COLONOSCOPY WITH PROPOFOL;  Surgeon: [name], MD;  Location: [facility]  ENDOSCOPY;  Service: Endoscopy;  Laterality: N/A;  . ESOPHAGOGASTRODUODENOSCOPY (EGD) WITH PROPOFOL N/A 09/16/2016   Procedure: ESOPHAGOGASTRODUODENOSCOPY (EGD) WITH PROPOFOL;  Surgeon: [name], MD;  Location: [facility] ENDOSCOPY;  Service: Endoscopy;  Laterality: N/A;  . ESOPHAGOGASTRODUODENOSCOPY (EGD) WITH PROPOFOL N/A 12/16/2016   Procedure: ESOPHAGOGASTRODUODENOSCOPY (EGD) WITH PROPOFOL;  Surgeon: [name], MD;  Location: [facility] ENDOSCOPY;  Service: Endoscopy;  Laterality: N/A;  . STOMACH SURGERY      Home Medications:  Allergies as of 05/16/2019      Reactions   Sucralfate Other (See Comments)   dizziness      Medication List       Accurate as of May 16, 2019  2:02 PM. If you have any questions, ask your nurse or doctor.        amLODipine 10 MG tablet Commonly known as: NORVASC TAKE 1 TABLET BY MOUTH ONCE DAILY   aspirin EC 81 MG tablet Take 81 mg by mouth daily.   atorvastatin 10 MG tablet Commonly known as: LIPITOR Take 10 mg by mouth daily.   fluticasone 50 MCG/ACT nasal spray Commonly known as: FLONASE Place into the nose.   hydrochlorothiazide 25 MG tablet Commonly known as: HYDRODIURIL Take 1 tablet (25 mg total) by mouth daily.   pantoprazole 40 MG tablet Commonly known as: PROTONIX Take 1 tablet by mouth daily.       Allergies:  Allergies  Allergen Reactions  . Sucralfate Other (See Comments)    dizziness  Family History: History reviewed. No pertinent family history.  Social History:  reports that he quit smoking about 8 years ago. He has a 35.70 pack-year smoking history. He has never used smokeless tobacco. He reports that he does not drink alcohol or use drugs.   Physical Exam: BP 122/77   Pulse 72   Ht 5\' 11"  (1.803 m)   Wt 199 lb (90.3 kg)   BMI 27.75 kg/m   Constitutional:  Alert and oriented, No acute distress.  Somewhat hard of hearing.  Daughter available by phone today. HEENT: [place] AT, moist mucus membranes.   Trachea midline, no masses. Cardiovascular: No clubbing, cyanosis, or edema. Respiratory: Normal respiratory effort, no increased work of breathing. Skin: No rashes, bruises or suspicious lesions. Neurologic: Grossly intact, no focal deficits, moving all 4 extremities. Psychiatric: Normal mood and affect.  Laboratory Data: Lab Results  Component Value Date   WBC 4.1 03/31/2019   HGB 14.5 03/31/2019   HCT 44.3 03/31/2019   MCV 82.5 03/31/2019   PLT 252 03/31/2019    Lab Results  Component Value Date   CREATININE 1.35 (H) 03/31/2019    Lab Results  Component Value Date   PSA 1.9 12/15/2010    Urinalysis UA today with 3-10 red blood cells per high-power field, otherwise unremarkable  Pertinent Imaging: No recent cross-sectional imaging for review Assessment & Plan:    1. Microscopic hematuria Persistent microscopic hematuria and former smoker  Given his age and smoking history, if also the high risk category and as per AUA guidelines, would recommend a hematuria evaluation with CT urogram and cystoscopy.  Differential diagnosis discussed with the patient.  He is agreeable to proceed as planned.  - Urinalysis, Complete - CT HEMATURIA WORKUP; Future  2. Benign prostatic hyperplasia with urinary hesitancy Mild obstructive urinary symptoms  Patient endorses he has minimal bother from the symptoms but his daughter alludes to that this may be more significant than he lets on  If he like to try medication, would consider Flomax although this can cause issues with retrograde ejaculation and orthostatic hypotension.  This point, they like to hold off but if symptoms worsen or he decides he like to try this after discussing further with his daughter, the let us know  Return in about 4 weeks (around 06/13/2019) for CT scan, cysto.  Hollice Espy, MD  [facility] [address] [place], [place] [address] [phone]

## 2019-05-16 NOTE — Patient Instructions (Signed)
Radiology will call to schedule CT scan which should be done prior to Cysto.  Results will be reviewed on the day of Cystoscopy  Cystoscopy Cystoscopy is a procedure that is used to help diagnose and sometimes treat conditions that affect the lower urinary tract. The lower urinary tract includes the bladder and the urethra. The urethra is the tube that drains urine from the bladder. Cystoscopy is done using a thin, tube-shaped instrument with a light and camera at the end (cystoscope). The cystoscope may be hard or flexible, depending on the goal of the procedure. The cystoscope is inserted through the urethra, into the bladder. Cystoscopy may be recommended if you have:  Urinary tract infections that keep coming back.  Blood in the urine (hematuria).  An inability to control when you urinate (urinary incontinence) or an overactive bladder.  Unusual cells found in a urine sample.  A blockage in the urethra, such as a urinary stone.  Painful urination.  An abnormality in the bladder found during an intravenous pyelogram (IVP) or CT scan. Cystoscopy may also be done to remove a sample of tissue to be examined under a microscope (biopsy). Tell a health care provider about:  Any allergies you have.  All medicines you are taking, including vitamins, herbs, eye drops, creams, and over-the-counter medicines.  Any problems you or family members have had with anesthetic medicines.  Any blood disorders you have.  Any surgeries you have had.  Any medical conditions you have.  Whether you are pregnant or may be pregnant. What are the risks? Generally, this is a safe procedure. However, problems may occur, including:  Infection.  Bleeding.  Allergic reactions to medicines.  Damage to other structures or organs. What happens before the procedure?  Ask your health care provider about: ? Changing or stopping your regular medicines. This is especially important if you are taking  diabetes medicines or blood thinners. ? Taking medicines such as aspirin and ibuprofen. These medicines can thin your blood. Do not take these medicines unless your health care provider tells you to take them. ? Taking over-the-counter medicines, vitamins, herbs, and supplements.  Follow instructions from your health care provider about eating or drinking restrictions.  Ask your health care provider what steps will be taken to help prevent infection. These may include: ? Washing skin with a germ-killing soap. ? Taking antibiotic medicine.  You may have an exam or testing, such as: ? X-rays of the bladder, urethra, or kidneys. ? Urine tests to check for signs of infection.  Plan to have someone take you home from the hospital or clinic. What happens during the procedure?   You will be given one or more of the following: ? A medicine to help you relax (sedative). ? A medicine to numb the area (local anesthetic).  The area around the opening of your urethra will be cleaned.  The cystoscope will be passed through your urethra into your bladder.  Germ-free (sterile) fluid will flow through the cystoscope to fill your bladder. The fluid will stretch your bladder so that your health care provider can clearly examine your bladder walls.  Your doctor will look at the urethra and bladder. Your doctor may take a biopsy or remove stones.  The cystoscope will be removed, and your bladder will be emptied. The procedure may vary among health care providers and hospitals. What can I expect after the procedure? After the procedure, it is common to have:  Some soreness or pain in your abdomen and  urethra.  Urinary symptoms. These include: ? Mild pain or burning when you urinate. Pain should stop within a few minutes after you urinate. This may last for up to 1 week. ? A small amount of blood in your urine for several days. ? Feeling like you need to urinate but producing only a small amount of  urine. Follow these instructions at home: Medicines  Take over-the-counter and prescription medicines only as told by your health care provider.  If you were prescribed an antibiotic medicine, take it as told by your health care provider. Do not stop taking the antibiotic even if you start to feel better. General instructions  Return to your normal activities as told by your health care provider. Ask your health care provider what activities are safe for you.  Do not drive for 24 hours if you were given a sedative during your procedure.  Watch for any blood in your urine. If the amount of blood in your urine increases, call your health care provider.  Follow instructions from your health care provider about eating or drinking restrictions.  If a tissue sample was removed for testing (biopsy) during your procedure, it is up to you to get your test results. Ask your health care provider, or the department that is doing the test, when your results will be ready.  Drink enough fluid to keep your urine pale yellow.  Keep all follow-up visits as told by your health care provider. This is important. Contact a health care provider if you:  Have pain that gets worse or does not get better with medicine, especially pain when you urinate.  Have trouble urinating.  Have more blood in your urine. Get help right away if you:  Have blood clots in your urine.  Have abdominal pain.  Have a fever or chills.  Are unable to urinate. Summary  Cystoscopy is a procedure that is used to help diagnose and sometimes treat conditions that affect the lower urinary tract.  Cystoscopy is done using a thin, tube-shaped instrument with a light and camera at the end.  After the procedure, it is common to have some soreness or pain in your abdomen and urethra.  Watch for any blood in your urine. If the amount of blood in your urine increases, call your health care provider.  If you were prescribed an  antibiotic medicine, take it as told by your health care provider. Do not stop taking the antibiotic even if you start to feel better. This information is not intended to replace advice given to you by your health care provider. Make sure you discuss any questions you have with your health care provider. Document Revised: 03/07/2018 Document Reviewed: 03/07/2018 Elsevier Patient Education  [place].

## 2019-06-15 ENCOUNTER — Ambulatory Visit
Admission: RE | Admit: 2019-06-15 | Discharge: 2019-06-15 | Disposition: A | Discharge status: Home / Self Care | Payer: Medicare Other | Source: Ambulatory Visit | Attending: Urology | Admitting: Urology

## 2019-06-15 ENCOUNTER — Other Ambulatory Visit: Payer: Self-pay

## 2019-06-15 DIAGNOSIS — R3129 Other microscopic hematuria: Secondary | ICD-10-CM | POA: Diagnosis not present

## 2019-06-15 LAB — POCT I-STAT CREATININE: Creatinine, Ser: 1.1 mg/dL (ref 0.61–1.24)

## 2019-06-15 MED ORDER — IOHEXOL 300 MG/ML  SOLN
150.0000 mL | Freq: Once | INTRAMUSCULAR | Status: AC | PRN
  Administered 2019-06-15: 125 mL via INTRAVENOUS

## 2019-06-18 NOTE — Progress Notes (Incomplete)
06/19/19  CC: No chief complaint on file.   HPI: Ruben Diaz is a 74 y.o. who returns today for a cystoscopy/CT results for the evaluation and management of microscopic hematuria.   He is on microscopic blood in his urine, 4-10 red blood cells per high-power field on 3 separate occasions on 10/10/2018, 10/17/2018 and 04/13/2019. These are in the absence of infection or any other worrisome features.  Notably, the patient is recovering well from COVID-19 infection earlier this year.  He does have acute kidney injury which was felt to be associated with his infection, creatinine 1.4 from previous baseline of ~1.0.    He did have a chest x-ray in 01/2019 which shows a 3 mm left upper pole nonobstructing stone versus vascular calcification as well as a right renal vascular calcification.  No recent cross-sectional imaging.  He has a personal history of hepatocellular carcinoma as well as a right upper lung malignancy followed by Dr. Mike Gip.  Former smoker, 35-pack-year history.   There were no vitals taken for this visit. NED. A&Ox3.   No respiratory distress   Abd soft, NT, ND Normal phallus with bilateral descended testicles  Cystoscopy Procedure Note  Patient identification was confirmed, informed consent was obtained, and patient was prepped using Betadine solution.  Lidocaine jelly was administered per urethral meatus.     Pre-Procedure: - Inspection reveals a normal caliber ureteral meatus.  Procedure: The flexible cystoscope was introduced without difficulty - No urethral strictures/lesions are present. - {Blank multiple:19197::"Enlarged","Surgically absent","Normal"} prostate *** - {Blank multiple:19197::"Normal","Elevated","Tight"} bladder neck - Bilateral ureteral orifices identified - Bladder mucosa  reveals no ulcers, tumors, or lesions - No bladder stones - No trabeculation  Retroflexion shows ***   Post-Procedure: - Patient tolerated the  procedure well  Pertinent Imaging:  CLINICAL DATA:  Microhematuria. Dysuria for 2 months. Prior appendectomy. History of right lung cancer.  EXAM: CT ABDOMEN AND PELVIS WITHOUT AND WITH CONTRAST  TECHNIQUE: Multidetector CT imaging of the abdomen and pelvis was performed following the standard protocol before and following the bolus administration of intravenous contrast.  CONTRAST:  111mL OMNIPAQUE IOHEXOL 300 MG/ML  SOLN  COMPARISON:  05/04/2017 PET-CT. 01/09/2012 unenhanced CT abdomen/pelvis.  FINDINGS: Lower chest: Posterior left lower lobe solid pulmonary nodules, largest 4 mm (series 11/image 2), all stable since 2013 CT, considered benign. No acute abnormality at the lung bases. Coronary atherosclerosis.  Hepatobiliary: Normal liver size. Scattered simple liver cysts, largest 2.5 cm in the inferior right liver lobe. Several subcentimeter hypodense liver lesions scattered throughout the liver, too small to characterize, seen to represent simple cysts on 05/12/2017 MRI. No new liver lesions. Normal gallbladder with no radiopaque cholelithiasis. No biliary ductal dilatation.  Pancreas: Normal, with no mass or duct dilation.  Spleen: Normal size. No mass.  Adrenals/Urinary Tract: Normal adrenals. No right renal stones. Nonobstructing 2 mm interpolar left renal stone. No hydronephrosis. Normal caliber ureters. No ureteral stones. Subcentimeter hypodense renal cortical lesions scattered in the left kidney, too small to characterize, requiring no follow-up. No suspicious renal cortical masses. On delayed imaging, there is no urothelial wall thickening and there are no filling defects in the opacified portions of the bilateral collecting systems or ureters. No bladder stones, masses or diverticula. Mild diffuse bladder wall thickening and trabeculation.  Stomach/Bowel: Small hiatal hernia. Otherwise normal nondistended stomach. Normal caliber small bowel with  no small bowel wall thickening. Appendectomy. Mild sigmoid diverticulosis, with no large bowel wall thickening or acute pericolonic fat stranding.  Vascular/Lymphatic: Atherosclerotic  abdominal aorta with ectatic 2.7 cm infrarenal abdominal aorta. Patent portal, splenic, hepatic and renal veins. No pathologically enlarged lymph nodes in the abdomen or pelvis.  Reproductive: Mild prostatomegaly with nonspecific internal prostatic calcifications.  Other: No pneumoperitoneum, ascites or focal fluid collection. Small fat containing supraumbilical ventral abdominal hernia to the right of midline (series 9/image 30). Small fat containing periumbilical hernia.  Musculoskeletal: No aggressive appearing focal osseous lesions. Moderate thoracolumbar spondylosis.  IMPRESSION: 1. Nonobstructing 2 mm interpolar left renal stone. No hydronephrosis. No ureteral or bladder stones. 2. No suspicious renal cortical masses. No evidence of urothelial lesions. 3. Nonspecific mild diffuse bladder wall thickening and trabeculation, probably due to chronic bladder outlet obstruction by the mildly enlarged prostate. 4. Ectatic 2.7 cm infrarenal abdominal aorta, at risk for aneurysm development. Recommend follow-up aortic ultrasound in 5 years. This recommendation follows ACR consensus guidelines: White Paper of the ACR Incidental Findings Committee II on Vascular Findings. J Am Coll Radiol 2013; 10:789-794. 5. Additional findings include coronary atherosclerosis, small hiatal hernia, small fat containing supraumbilical and periumbilical hernias and mild sigmoid diverticulosis. 6. Aortic Atherosclerosis (ICD10-I70.0).   Electronically Signed   By: Ilona Sorrel M.D.   On: 06/15/2019 13:08  I have personally reviewed the images and agree with radiologist interpretation.   Assessment/ Plan:   No follow-ups on file.  Dolores Frame, am acting as a scribe for Dr.  Hollice Espy,  {Add Scribe Attestation Statement}

## 2019-06-19 ENCOUNTER — Other Ambulatory Visit: Payer: Self-pay | Admitting: Urology

## 2019-07-18 ENCOUNTER — Other Ambulatory Visit: Payer: Self-pay

## 2019-07-18 ENCOUNTER — Ambulatory Visit (INDEPENDENT_AMBULATORY_CARE_PROVIDER_SITE_OTHER): Payer: Medicare Other | Admitting: Urology

## 2019-07-18 VITALS — BP 113/67 | HR 71 | Ht 72.0 in | Wt 210.0 lb

## 2019-07-18 DIAGNOSIS — R3129 Other microscopic hematuria: Secondary | ICD-10-CM

## 2019-07-18 NOTE — Progress Notes (Signed)
07/18/19  CC:  Chief Complaint  Patient presents with  . Cysto    HPI: Ruben Diaz is a 74 y.o. M who returns today for cysto for further evaluation of microscopic hematuria.  He had microscopic blood in his urine, 4-10 red blood cells per high-power field on 3 separate occasions on 10/10/2018, 10/17/2018 and 04/13/2019.  These are in the absence of infection or any other worrisome features. UA on 05/16/19 revealed 3-10 RBC.   He has a personal history of hepatocellular carcinoma as well as a right upper lung malignancy followed by Dr. Mike Gip.  CT on 06/15/19 revealed nonbstructign 2 mm interpolar left renal stone. No hydronephrosis. No ureteral or bladder stones. Nonspecific mild diffuse bladder wall thickening and trabeculation, probably due to chronic bladder outlet obstruction by the mildly enlarged prostate.  No bothersome urinary symptoms today.   Former smoker, 35-pack-year history.  No history of gross hematuria.  No personal history of stones.  Vitals:   07/18/19 1350  BP: 113/67  Pulse: 71   NED. A&Ox3.   No respiratory distress   Abd soft, NT, ND Normal phallus with bilateral descended testicles  Cystoscopy Procedure Note  Patient identification was confirmed, informed consent was obtained, and patient was prepped using Betadine solution.  Lidocaine jelly was administered per urethral meatus.     Pre-Procedure: - Inspection reveals a normal caliber ureteral meatus.  Foreskin tight but ultimately retractable.  Procedure: The flexible cystoscope was introduced without difficulty - No urethral strictures/lesions are present. - Enlarged prostate w/ trilobar coaptation and intravesical protrusion into bladder  - Elevated bladder neck - Bilateral ureteral orifices identified - Bladder mucosa  reveals no ulcers, tumors, or lesions - No bladder stones - Moderately trabeculated bladder w/ saccules.   Retroflexion shows no tumors.     Post-Procedure: - Patient tolerated the procedure well  Pertinent Imagings:   CLINICAL DATA:  Microhematuria. Dysuria for 2 months. Prior appendectomy. History of right lung cancer.  EXAM: CT ABDOMEN AND PELVIS WITHOUT AND WITH CONTRAST  TECHNIQUE: Multidetector CT imaging of the abdomen and pelvis was performed following the standard protocol before and following the bolus administration of intravenous contrast.  CONTRAST:  161mL OMNIPAQUE IOHEXOL 300 MG/ML  SOLN  COMPARISON:  05/04/2017 PET-CT. 01/09/2012 unenhanced CT abdomen/pelvis.  FINDINGS: Lower chest: Posterior left lower lobe solid pulmonary nodules, largest 4 mm (series 11/image 2), all stable since 2013 CT, considered benign. No acute abnormality at the lung bases. Coronary atherosclerosis.  Hepatobiliary: Normal liver size. Scattered simple liver cysts, largest 2.5 cm in the inferior right liver lobe. Several subcentimeter hypodense liver lesions scattered throughout the liver, too small to characterize, seen to represent simple cysts on 05/12/2017 MRI. No new liver lesions. Normal gallbladder with no radiopaque cholelithiasis. No biliary ductal dilatation.  Pancreas: Normal, with no mass or duct dilation.  Spleen: Normal size. No mass.  Adrenals/Urinary Tract: Normal adrenals. No right renal stones. Nonobstructing 2 mm interpolar left renal stone. No hydronephrosis. Normal caliber ureters. No ureteral stones. Subcentimeter hypodense renal cortical lesions scattered in the left kidney, too small to characterize, requiring no follow-up. No suspicious renal cortical masses. On delayed imaging, there is no urothelial wall thickening and there are no filling defects in the opacified portions of the bilateral collecting systems or ureters. No bladder stones, masses or diverticula. Mild diffuse bladder wall thickening and trabeculation.  Stomach/Bowel: Small hiatal hernia. Otherwise normal  nondistended stomach. Normal caliber small bowel with no small bowel wall thickening. Appendectomy. Mild  sigmoid diverticulosis, with no large bowel wall thickening or acute pericolonic fat stranding.  Vascular/Lymphatic: Atherosclerotic abdominal aorta with ectatic 2.7 cm infrarenal abdominal aorta. Patent portal, splenic, hepatic and renal veins. No pathologically enlarged lymph nodes in the abdomen or pelvis.  Reproductive: Mild prostatomegaly with nonspecific internal prostatic calcifications.  Other: No pneumoperitoneum, ascites or focal fluid collection. Small fat containing supraumbilical ventral abdominal hernia to the right of midline (series 9/image 30). Small fat containing periumbilical hernia.  Musculoskeletal: No aggressive appearing focal osseous lesions. Moderate thoracolumbar spondylosis.  IMPRESSION: 1. Nonobstructing 2 mm interpolar left renal stone. No hydronephrosis. No ureteral or bladder stones. 2. No suspicious renal cortical masses. No evidence of urothelial lesions. 3. Nonspecific mild diffuse bladder wall thickening and trabeculation, probably due to chronic bladder outlet obstruction by the mildly enlarged prostate. 4. Ectatic 2.7 cm infrarenal abdominal aorta, at risk for aneurysm development. Recommend follow-up aortic ultrasound in 5 years. This recommendation follows ACR consensus guidelines: White Paper of the ACR Incidental Findings Committee II on Vascular Findings. J Am Coll Radiol 2013; 10:789-794. 5. Additional findings include coronary atherosclerosis, small hiatal hernia, small fat containing supraumbilical and periumbilical hernias and mild sigmoid diverticulosis. 6. Aortic Atherosclerosis (ICD10-I70.0).   Electronically Signed   By: Ilona Sorrel M.D.   On: 06/15/2019 13:08  I have personally reviewed the images and agree with radiologist interpretation.   Assessment/ Plan:  1. BPH w/ outlet obstruction  Cystoscopic  sequela of infections and trabeculated bladder as noted on CT  Strongly recommend pharmacotherapy or bladder outlet procedures however declined in past.  Will like to follow him closely for worsening outlet obstruction.  Return in 3 months w/ PA-C for IPSS/PVR   2. Microscopic hematuria  Secondary to BPH vs. Phimosis   3. Phimosis Asymptomatic  May be contributing factor to above  Ultimately pulled foreskin back   4. Kidney stones  Punctate stones Asymptomatic   Return in about 3 months (around 10/17/2019) for IPSS, UA, PVR with Larene Beach.  Jamas Lav, am acting as a scribe for Dr. Hollice Espy,  I have reviewed the above documentation for accuracy and completeness, and I agree with the above.   Hollice Espy, MD

## 2019-07-19 LAB — URINALYSIS, COMPLETE
Bilirubin, UA: NEGATIVE
Glucose, UA: NEGATIVE
Ketones, UA: NEGATIVE
Nitrite, UA: NEGATIVE
Protein,UA: NEGATIVE
Specific Gravity, UA: 1.025 (ref 1.005–1.030)
Urobilinogen, Ur: 0.2 mg/dL (ref 0.2–1.0)
pH, UA: 6 (ref 5.0–7.5)

## 2019-07-19 LAB — MICROSCOPIC EXAMINATION: Bacteria, UA: NONE SEEN

## 2019-07-22 ENCOUNTER — Encounter: Payer: Self-pay | Admitting: Emergency Medicine

## 2019-07-22 ENCOUNTER — Emergency Department
Admission: EM | Admit: 2019-07-22 | Discharge: 2019-07-22 | Disposition: A | Discharge status: Home / Self Care | Payer: Medicare Other | Attending: Emergency Medicine | Admitting: Emergency Medicine

## 2019-07-22 ENCOUNTER — Other Ambulatory Visit: Payer: Self-pay

## 2019-07-22 DIAGNOSIS — N401 Enlarged prostate with lower urinary tract symptoms: Secondary | ICD-10-CM | POA: Insufficient documentation

## 2019-07-22 DIAGNOSIS — Z7982 Long term (current) use of aspirin: Secondary | ICD-10-CM | POA: Insufficient documentation

## 2019-07-22 DIAGNOSIS — R300 Dysuria: Secondary | ICD-10-CM | POA: Diagnosis present

## 2019-07-22 DIAGNOSIS — Z87891 Personal history of nicotine dependence: Secondary | ICD-10-CM | POA: Diagnosis not present

## 2019-07-22 DIAGNOSIS — I129 Hypertensive chronic kidney disease with stage 1 through stage 4 chronic kidney disease, or unspecified chronic kidney disease: Secondary | ICD-10-CM | POA: Insufficient documentation

## 2019-07-22 DIAGNOSIS — Z79899 Other long term (current) drug therapy: Secondary | ICD-10-CM | POA: Diagnosis not present

## 2019-07-22 DIAGNOSIS — N189 Chronic kidney disease, unspecified: Secondary | ICD-10-CM | POA: Diagnosis not present

## 2019-07-22 DIAGNOSIS — N390 Urinary tract infection, site not specified: Secondary | ICD-10-CM | POA: Insufficient documentation

## 2019-07-22 LAB — URINALYSIS, COMPLETE (UACMP) WITH MICROSCOPIC
Bilirubin Urine: NEGATIVE
Glucose, UA: 100 mg/dL — AB
Ketones, ur: NEGATIVE mg/dL
Nitrite: NEGATIVE
Protein, ur: NEGATIVE mg/dL
Specific Gravity, Urine: 1.015 (ref 1.005–1.030)
WBC, UA: >50 WBC/hpf (ref 0–5)
pH: 6 (ref 5.0–8.0)

## 2019-07-22 LAB — GLUCOSE, CAPILLARY: Glucose-Capillary: 116 mg/dL — ABNORMAL HIGH (ref 70–99)

## 2019-07-22 MED ORDER — CIPROFLOXACIN HCL 500 MG PO TABS: 500.0000 mg | ORAL_TABLET | Freq: Two times a day (BID) | ORAL | 0 refills | Status: AC

## 2019-07-22 NOTE — ED Provider Notes (Signed)
[facility] Emergency Department Provider Note  ____________________________________________   First MD Initiated Contact with Patient 07/22/19 1621     (approximate)  I have reviewed the triage vital signs and the nursing notes.   HISTORY  Chief Complaint Dysuria    HPI Ruben Diaz is a 74 y.o. male presents emergency department complaining of burning with urination and increased frequency.  States feels he has UTI.  No fever, chills, vomiting or diarrhea.  When asked about prostate problems he does not understand what a prostate is.    Past Medical History:  Diagnosis Date  . Cancer ([place])   . Chronic kidney disease   . Dysphagia   . GERD (gastroesophageal reflux disease)   . History of BPH   . Hyperlipidemia   . Hypertension   . Myocardial infarction ([place])   . Peripheral vascular disease [facility])     Patient Active Problem List   Diagnosis Date Noted  . Dehydration 06/21/2017  . Hypokalemia 06/21/2017  . Primary cancer of right upper lobe of lung ([place]) 05/25/2017  . Nodule of upper lobe of right lung 05/06/2017  . Liver lesion 05/06/2017  . Hyperlipidemia, unspecified 05/02/2017  . Hypertension 05/02/2017  . Personal history of tobacco use, presenting hazards to health 04/07/2016    Past Surgical History:  Procedure Laterality Date  . APPENDECTOMY    . COLONOSCOPY WITH PROPOFOL N/A 09/16/2016   Procedure: COLONOSCOPY WITH PROPOFOL;  Surgeon: [name], MD;  Location: [facility] ENDOSCOPY;  Service: Endoscopy;  Laterality: N/A;  . ESOPHAGOGASTRODUODENOSCOPY (EGD) WITH PROPOFOL N/A 09/16/2016   Procedure: ESOPHAGOGASTRODUODENOSCOPY (EGD) WITH PROPOFOL;  Surgeon: [name], MD;  Location: [facility] ENDOSCOPY;  Service: Endoscopy;  Laterality: N/A;  . ESOPHAGOGASTRODUODENOSCOPY (EGD) WITH PROPOFOL N/A 12/16/2016   Procedure: ESOPHAGOGASTRODUODENOSCOPY (EGD) WITH PROPOFOL;  Surgeon: [name], MD;  Location: [facility]  ENDOSCOPY;  Service: Endoscopy;  Laterality: N/A;  . STOMACH SURGERY      Prior to Admission medications   Medication Sig Start Date End Date Taking? Authorizing Provider  amLODipine (NORVASC) 10 MG tablet TAKE 1 TABLET BY MOUTH ONCE DAILY 05/22/18   Cammie Sickle, MD  aspirin EC 81 MG tablet Take 81 mg by mouth daily.    [provider]  atorvastatin (LIPITOR) 10 MG tablet Take 10 mg by mouth daily.    [provider]  ciprofloxacin (CIPRO) 500 MG tablet Take 1 tablet (500 mg total) by mouth 2 (two) times daily for 7 days. 07/22/19 07/29/19  Dervin Vore, Linden Dolin, PA-C  hydrochlorothiazide (HYDRODIURIL) 25 MG tablet Take 1 tablet (25 mg total) by mouth daily. 06/23/18   Cammie Sickle, MD  pantoprazole (PROTONIX) 40 MG tablet Take 1 tablet by mouth daily. 05/19/17   [provider]    Allergies Sucralfate  History reviewed. No pertinent family history.  Social History Social History   Tobacco Use  . Smoking status: Former Smoker    Packs/day: 0.70    Years: 51.00    Pack years: 35.70    Quit date: 2013    Years since quitting: 8.3  . Smokeless tobacco: Never Used  Substance Use Topics  . Alcohol use: No  . Drug use: No    Review of Systems  Constitutional: No fever/chills Eyes: No visual changes. ENT: No sore throat. Respiratory: Denies cough Cardiovascular: Denies chest pain Gastrointestinal: Denies abdominal pain Genitourinary: Positive for dysuria. Musculoskeletal: Negative for back pain. Skin: Negative for rash. Psychiatric: no mood changes,  ____________________________________________   PHYSICAL EXAM:  VITAL SIGNS: ED Triage Vitals  Enc Vitals Group     BP 07/22/19 1608 130/66     Pulse Rate 07/22/19 1608 77     Resp 07/22/19 1608 18     Temp 07/22/19 1612 99 F (37.2 C)     Temp Source 07/22/19 1612 Oral     SpO2 07/22/19 1608 100 %     Weight 07/22/19 1608 210 lb (95.3 kg)     Height 07/22/19 1608 6' (1.829 m)      Head Circumference --      Peak Flow --      Pain Score 07/22/19 1608 0     Pain Loc --      Pain Edu? --      Excl. in [place]? --     Constitutional: Alert and oriented. Well appearing and in no acute distress. Eyes: Conjunctivae are normal.  Head: Atraumatic. Nose: No congestion/rhinnorhea. Mouth/Throat: Mucous membranes are moist.   Neck:  supple no lymphadenopathy noted Cardiovascular: Normal rate, regular rhythm. Heart sounds are normal Respiratory: Normal respiratory effort.  No retractions, lungs c t a  Abd: soft nontender bs normal all 4 quad GU: deferred Musculoskeletal: FROM all extremities, warm and well perfused Neurologic:  Normal speech and language.  Skin:  Skin is warm, dry and intact. No rash noted. Psychiatric: Mood and affect are normal. Speech and behavior are normal.  ____________________________________________   LABS (all labs ordered are listed, but only abnormal results are displayed)  Labs Reviewed  URINALYSIS, COMPLETE (UACMP) WITH MICROSCOPIC - Abnormal; Notable for the following components:      Result Value   APPearance HAZY (*)    Glucose, UA 100 (*)    Hgb urine dipstick MODERATE (*)    Leukocytes,Ua MODERATE (*)    Bacteria, UA FEW (*)    All other components within normal limits  GLUCOSE, CAPILLARY - Abnormal; Notable for the following components:   Glucose-Capillary 116 (*)    All other components within normal limits  URINE CULTURE   ____________________________________________   ____________________________________________  RADIOLOGY    ____________________________________________   PROCEDURES  Procedure(s) performed: No  Procedures    ____________________________________________   INITIAL IMPRESSION / ASSESSMENT AND PLAN / ED COURSE  Pertinent labs & imaging results that were available during my care of the patient were reviewed by me and considered in my medical decision making (see chart for details).     Patient 74 year old male presents emergency department with concerns of UTI.  See HPI  Physical exam shows patient to appear well.  Abdomen is nontender.  Remainder of exam is unremarkable  UA shows moderate leuks, greater than 50 WBCs, a few bacteria, FSBS shows at 116  Labs are reassuring that patient is stable.  Feel that he does have a urinary tract infection as he is symptomatic.  Start him on a regimen of Cipro 500 twice daily for 7 days.  He is to follow-up with his regular doctor if not improving in 3 days.  Return emergency department worsening.  He does have a urologist he may also follow-up with.  A culture was ordered to ensure we have him on the correct antibiotic.  He was discharged in stable condition.    Ruben Diaz was evaluated in Emergency Department on 07/22/2019 for the symptoms described in the history of present illness. He was evaluated in the context of the global COVID-19 pandemic, which necessitated consideration that the patient might  be at risk for infection with the SARS-CoV-2 virus that causes COVID-19. Institutional protocols and algorithms that pertain to the evaluation of patients at risk for COVID-19 are in a state of rapid change based on information released by regulatory bodies including the CDC and federal and state organizations. These policies and algorithms were followed during the patient's care in the ED.   As part of my medical decision making, I reviewed the following data within the [place] notes reviewed and incorporated, Labs reviewed , Notes from prior ED visits and [place] Controlled Substance Database  ____________________________________________   FINAL CLINICAL IMPRESSION(S) / ED DIAGNOSES  Final diagnoses:  Lower urinary tract infectious disease      NEW MEDICATIONS STARTED DURING THIS VISIT:  New Prescriptions   CIPROFLOXACIN (CIPRO) 500 MG TABLET    Take 1 tablet (500 mg total) by mouth 2 (two)  times daily for 7 days.     Note:  This document was prepared using Dragon voice recognition software and may include unintentional dictation errors.    Versie Starks, PA-C 07/22/19 1712    Earleen Newport, MD 07/22/19 Einar Crow

## 2019-07-22 NOTE — Discharge Instructions (Signed)
Take your antibiotic as prescribed.  Return emergency department if worsening.  Follow-up with your regular doctor if not better in 3 to 4 days.

## 2019-07-22 NOTE — ED Triage Notes (Signed)
Pt here for dysuria.  C/o burning with urination and increased frequency; only gets a little out when he goes.  Feels like UTI per pt.  No fever. Asked pt multiple times, denies any pain at all unless actively urinating.  No abdominal or back pain. No hx DM, will check CBG in triage.  Has seen dr Erlene Quan for hematuria but reports this is different.

## 2019-07-22 NOTE — ED Notes (Signed)
Pt given cup to provide urine sample

## 2019-07-24 LAB — URINE CULTURE: Culture: 50000 — AB

## 2019-08-13 ENCOUNTER — Ambulatory Visit
Admission: RE | Admit: 2019-08-13 | Discharge: 2019-08-13 | Disposition: A | Discharge status: Home / Self Care | Payer: Medicare Other | Source: Ambulatory Visit | Attending: Hematology and Oncology | Admitting: Hematology and Oncology

## 2019-08-13 ENCOUNTER — Other Ambulatory Visit: Payer: Self-pay

## 2019-08-13 DIAGNOSIS — C3411 Malignant neoplasm of upper lobe, right bronchus or lung: Secondary | ICD-10-CM | POA: Diagnosis not present

## 2019-08-14 NOTE — Progress Notes (Signed)
[facility]  [address] [place], [place] [address] Phone: [phone]  Fax: [phone]   Clinic Day:  08/16/2019  Referring physician: Baxter Hire, MD  Chief Complaint: Ruben Diaz is a 74 y.o. male with stage IA adenocarcinoma of the RUL who is seen for 6 month assessment.   HPI: The patient was last seen in the medical oncology clinic on 02/16/2019. At that time, he was doing well. Exam was stable. CBC was normal. Sodium was 133 and creatinine 1.33.  Surveillance continued.    Patient tested positive for COVID-19 on 03/29/2019.   He saw Dr. Hollice Espy for hematuria on 05/16/2019. CT hematuria workup on 06/15/2019 revealed a nonobstructing 2 mm interpolar left renal stone. There was no hydronephrosis and no ureteral or bladder stones. There was no suspicious renal cortical masses. No evidence of urothelial lesions. There was nonspecific mild diffuse bladder wall thickening and trabeculation, probably due to chronic bladder outlet obstruction by the mildly enlarged prostate. There was an ectatic 2.7 cm infrarenal abdominal aorta, at risk for aneurysm development. Recommended follow-up aortic ultrasound in 5 years. Additional findings include coronary atherosclerosis, small hiatal hernia, small fat containing supraumbilical and periumbilical hernias and mild sigmoid diverticulosis.  Cystoscopy on 07/18/2019 revealed sequelae of infections and trabeculated bladder.  Recommendation was for medical therapy or bladder outlet procedure.  He has follow-up in 3 months.  Chest CT on 08/13/2019 revealed treatment related change in the anterior right upper lobe. There was no evidence of recurrent or metastatic disease. There was coronary artery calcifications.  During the interim, the patient felt "alright". He has diarrhea intermittently. His cough and shortness of breath are the same. He continues to have neuropathy. He denies any bone or  joint pain. His weight is down 11 lbs. Patient is eating good. The patient has received the COVID-19 vaccine.   Potassium is 2.9 today. I noted that I would contact his PCP to discuss oral potassium.    Past Medical History:  Diagnosis Date  . Cancer ([place])   . Chronic kidney disease   . Dysphagia   . GERD (gastroesophageal reflux disease)   . History of BPH   . Hyperlipidemia   . Hypertension   . Myocardial infarction ([place])   . Peripheral vascular disease [facility])     Past Surgical History:  Procedure Laterality Date  . APPENDECTOMY    . COLONOSCOPY WITH PROPOFOL N/A 09/16/2016   Procedure: COLONOSCOPY WITH PROPOFOL;  Surgeon: [name], MD;  Location[facility] ENDOSCOPY;  Service: Endoscopy;  Laterality: N/A;  . ESOPHAGOGASTRODUODENOSCOPY (EGD) WITH PROPOFOL N/A 09/16/2016   Procedure: ESOPHAGOGASTRODUODENOSCOPY (EGD) WITH PROPOFOL;  Surgeon: [name], MD;  Location: [facility] ENDOSCOPY;  Service: Endoscopy;  Laterality: N/A;  . ESOPHAGOGASTRODUODENOSCOPY (EGD) WITH PROPOFOL N/A 12/16/2016   Procedure: ESOPHAGOGASTRODUODENOSCOPY (EGD) WITH PROPOFOL;  Surgeon: [name], MD;  Location: [facility] ENDOSCOPY;  Service: Endoscopy;  Laterality: N/A;  . STOMACH SURGERY      History reviewed. No pertinent family history.  Social History:  reports that he quit smoking about 8 years ago. He has a 35.70 pack-year smoking history. He has never used smokeless tobacco. He reports that he does not drink alcohol or use drugs. He is retired. He worked in a [employer]. He denies any exposure to radiation or toxins. He lives in [place].  The patient is alone today.  Allergies:  Allergies  Allergen Reactions  . Sucralfate Other (See Comments)    dizziness  Current Medications: Current Outpatient Medications  Medication Sig Dispense Refill  . amLODipine (NORVASC) 10 MG tablet TAKE 1 TABLET BY MOUTH ONCE DAILY 90 tablet 0  . aspirin EC 81 MG tablet Take 81 mg by mouth daily.      Marland Kitchen atorvastatin (LIPITOR) 10 MG tablet Take 10 mg by mouth daily.    . hydrochlorothiazide (HYDRODIURIL) 25 MG tablet Take 1 tablet (25 mg total) by mouth daily. 30 tablet 3  . pantoprazole (PROTONIX) 40 MG tablet Take 1 tablet by mouth daily.    . polyethylene glycol (MIRALAX / GLYCOLAX) 17 g packet Take 17 g by mouth daily as needed.     No current facility-administered medications for this visit.    Review of Systems  Constitutional: Positive for weight loss (11 lbs; fluctates). Negative for chills, fever and malaise/fatigue.       Feels alright.  HENT: Positive for hearing loss. Negative for congestion, nosebleeds, sinus pain and sore throat.   Eyes: Negative.  Negative for blurred vision, double vision and photophobia.  Respiratory: Positive for cough (chronic) and shortness of breath (chronic). Negative for hemoptysis and wheezing.   Cardiovascular: Negative.  Negative for chest pain, palpitations, orthopnea, leg swelling and PND.  Gastrointestinal: Positive for diarrhea (intermittent). Negative for abdominal pain, blood in stool, constipation, heartburn, melena, nausea and vomiting.  Genitourinary: Negative.  Negative for dysuria, hematuria and urgency.  Musculoskeletal: Negative.  Negative for back pain, joint pain and myalgias.  Skin: Negative.  Negative for itching and rash.  Neurological: Positive for sensory change (numbness in left thumb and right foot). Negative for dizziness, tingling, speech change, focal weakness, weakness and headaches.  Endo/Heme/Allergies: Negative.  Does not bruise/bleed easily.  Psychiatric/Behavioral: Negative.  Negative for depression and memory loss. The patient is not nervous/anxious and does not have insomnia.   All other systems reviewed and are negative.  Performance status (ECOG): 1  Vitals Blood pressure 122/64, pulse (!) 59, temperature (!) 94.9 F (34.9 C), temperature source Tympanic, resp. rate 16, weight 199 lb 13.6 oz (90.6 kg), SpO2  99 %.   Physical Exam  Constitutional: He is oriented to person, place, and time. He appears well-developed and well-nourished. No distress.  HENT:  Head: Normocephalic and atraumatic.  Mouth/Throat: Oropharynx is clear and moist. No oropharyngeal exudate.  Male pattern baldness.  Gray beard.  Mask.  Eyes: Pupils are equal, round, and reactive to light. Conjunctivae and EOM are normal. No scleral icterus.  Brown eyes.  Cardiovascular: Normal rate, regular rhythm and normal heart sounds. Exam reveals no gallop.  No murmur heard. Pulmonary/Chest: Effort normal and breath sounds normal. No respiratory distress. He has no wheezes. He has no rales.  Abdominal: Soft. Bowel sounds are normal. He exhibits no distension and no mass. There is no abdominal tenderness. There is no rebound and no guarding.  Musculoskeletal:        General: No tenderness or edema. Normal range of motion.     Cervical back: Normal range of motion and neck supple.  Lymphadenopathy:    He has no cervical adenopathy.    He has no axillary adenopathy.       Right: No supraclavicular adenopathy present.       Left: No supraclavicular adenopathy present.  Neurological: He is alert and oriented to person, place, and time.  Skin: Skin is warm and dry. No rash noted. He is not diaphoretic. No erythema. No pallor.  Psychiatric: He has a normal mood and affect. His behavior is  normal. Judgment and thought content normal.  Nursing note and vitals reviewed.   Imaging studies: 04/18/2017: Low dose chest CT revealed several pulmonary nodules in the lungs, however, therewere2 new nodules in the anterior aspect of the right upper lobe. One nodule wasa12.9 mmnon solid lesion with multiple internal cystic areas. The most concerning lesionwasa solid 16.9 mmlesion in the anterior aspect of the right upper lobe near the apex just above the other previously described lesion. 05/04/2017: PET scanon 05/04/2017 revealed a markedly  hypermetabolic 19 mm right upper lobe pulmonary lesionc/wprimary lung neoplasm.There were no enlarged or hypermetabolic mediastinal/hilar lymph nodes.There was a single (poorly visualized on CT) hypermetabolic hepatic lesion near the gallbladder fossa. 05/12/2017: Liver MRI revealed no evidence of hepatic or other abdominal metastatic disease. 08/12/2017: Chest CTrevealed decrease in size of right upper lobe solid nodule(1.8 x 2.1 cm to 1.2 x 1.7 cm).There was interval decrease in size of adjacent right upper lobe sub solid nodule(1.3 x 0.9 cm to 1.0 x 0.7 cm).There was interval development of multiple tiny pulmonary nodules within the peripheral right lower lobe. Findingswerelikely infectious/inflammatory in etiology. 02/13/2018: Chest CT revealed stable subpleural right upper lobe nodule.There was adjacent peribronchovascular soft tissue thickening without a discretenodule. There was interval clearing of peribronchovascular nodularity previously seen in the right lower lobe. 08/14/2018:  Chest CT with contrast revealed stable post treatment related changes of the evolving radiation fibrosis in the anterior aspect of the right upper lobe. There were no findings to suggest local recurrence of disease or definite metastatic disease in the thorax. There was mild diffuse bronchial wall thickening with mild centrilobular and paraseptal emphysema suggestive of underlying COPD. There was aortic atherosclerosis, in addition to left main and 3 vessel coronary artery disease.  02/14/2019:  Chest CT revealed a stable post treatment changes of the right upper lobe. There was no evidence of recurrent or metastatic disease in the chest.    Appointment on 08/16/2019  Component Date Value Ref Range Status  . WBC 08/16/2019 3.3* 4.0 - 10.5 K/uL Final  . RBC 08/16/2019 4.72  4.22 - 5.81 MIL/uL Final  . Hemoglobin 08/16/2019 13.0  13.0 - 17.0 g/dL Final  . HCT 08/16/2019 40.4  39.0 - 52.0 % Final  .  MCV 08/16/2019 85.6  80.0 - 100.0 fL Final  . MCH 08/16/2019 27.5  26.0 - 34.0 pg Final  . MCHC 08/16/2019 32.2  30.0 - 36.0 g/dL Final  . RDW 08/16/2019 14.1  11.5 - 15.5 % Final  . Platelets 08/16/2019 180  150 - 400 K/uL Final  . nRBC 08/16/2019 0.0  0.0 - 0.2 % Final  . Neutrophils Relative % 08/16/2019 38  % Final  . Neutro Abs 08/16/2019 1.2* 1.7 - 7.7 K/uL Final  . Lymphocytes Relative 08/16/2019 43  % Final  . Lymphs Abs 08/16/2019 1.4  0.7 - 4.0 K/uL Final  . Monocytes Relative 08/16/2019 13  % Final  . Monocytes Absolute 08/16/2019 0.4  0.1 - 1.0 K/uL Final  . Eosinophils Relative 08/16/2019 5  % Final  . Eosinophils Absolute 08/16/2019 0.2  0.0 - 0.5 K/uL Final  . Basophils Relative 08/16/2019 1  % Final  . Basophils Absolute 08/16/2019 0.0  0.0 - 0.1 K/uL Final  . Immature Granulocytes 08/16/2019 0  % Final  . Abs Immature Granulocytes 08/16/2019 0.01  0.00 - 0.07 K/uL Final   Performed at [facility], [address], [place], [place] [address]    Assessment:  Ruben Diaz is  a 74 y.o. male with stage IA RUL lung cancers/p CT guided biopsy on 06/01/2017. Pathologyrevealed adenocarcinoma, acinar and solid pattern.  PET scanon 05/04/2017 revealed a markedly hypermetabolic 19 mm right upper lobe pulmonary lesionc/wprimary lung neoplasm.There were no enlarged or hypermetabolic mediastinal/hilar lymph nodes.There was a single (poorly visualized on CT) hypermetabolic hepatic lesion near the gallbladder fossa.Liver MRIon 05/12/2017 revealed no evidence of metastatic disease.  He declined surgery. He received 4 cycles of carboplatin and Alimta(06/14/2017 - 08/16/2017). He received SBRTfrom 11/01/2017 - 11/13/2017. He received6000 cGy in 5 fractions.  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 There were no findings to  suggest local recurrence of disease or definite metastatic disease in the thorax. There was mild diffuse bronchial wall thickening with mild centrilobular and paraseptal emphysema suggestive of underlying COPD. There was aortic atherosclerosis, in addition to left main and 3 vessel coronary artery disease.   Chest CT on 02/14/2019 revealed a stable post treatment changes of the right upper lobe. There was no evidence of recurrent or metastatic disease in the chest. Chest CT on 08/13/2019 revealed stable post treatment related change in the anterior right upper lobe. There was no evidence of recurrent or metastatic disease. There was coronary artery calcifications.  Patient tested positive for COVID-19 on 03/29/2019.  The patient has received the COVID-19 vaccine.   Symptomatically, he feels all right.  His cough and shortness of breath of the same.  Plan: 1.   Labs today: CBC with diff, CMP.  2.Stage IA RUL adenocarcinoma of the lung Reviewed chest CT from 08/13/2019 with patient.  Images personally reviewed.  Agree with radiology interpretation.   No evidence of recurrent disease.             Discuss plan for ongoing surveillance. 3.   Leukopenia  WBC 3300 and ANC of 1200.  Etiology is unclear.  New medications or herbal products.  Discuss repeating CBC and performing a work-up if low counts are persistent. 4.   Hypokalemia  Potassium 2.9.  Patient on a diuretic.  Labs sent to patient's primary care physician. 5.   RN to call PCP re: hypokalemia. 6.   RTC in 2 weeks for CBC with diff. 7.   Chest CT in 6 months. 8.   RTC in 6 months for MD assessment, labs (CBC with diff, CMP), and review of chest CT.  I discussed the assessment and treatment plan with the patient.  The patient was provided an opportunity to ask questions and all were answered.  The patient agreed with the plan and demonstrated an understanding of the instructions.  The patient was advised to call back if  the symptoms worsen or if the condition fails to improve as anticipated.   Lequita Asal, MD, PhD    08/16/2019, 8:51 AM  I, Selena Batten, am acting as scribe for [employer]. Mike Gip, MD, PhD.  I, Shadeed Colberg C. Mike Gip, MD, have reviewed the above documentation for accuracy and completeness, and I agree with the above.

## 2019-08-16 ENCOUNTER — Encounter: Payer: Self-pay | Admitting: Hematology and Oncology

## 2019-08-16 ENCOUNTER — Inpatient Hospital Stay: Payer: Medicare Other

## 2019-08-16 ENCOUNTER — Inpatient Hospital Stay: Payer: Medicare Other | Attending: Hematology and Oncology | Admitting: Hematology and Oncology

## 2019-08-16 ENCOUNTER — Other Ambulatory Visit: Payer: Self-pay

## 2019-08-16 ENCOUNTER — Telehealth: Payer: Self-pay

## 2019-08-16 VITALS — BP 122/64 | HR 59 | Temp 94.9°F | Resp 16 | Wt 199.8 lb

## 2019-08-16 DIAGNOSIS — D72819 Decreased white blood cell count, unspecified: Secondary | ICD-10-CM

## 2019-08-16 DIAGNOSIS — C3411 Malignant neoplasm of upper lobe, right bronchus or lung: Secondary | ICD-10-CM

## 2019-08-16 LAB — COMPREHENSIVE METABOLIC PANEL
ALT: 13 U/L (ref 0–44)
AST: 24 U/L (ref 15–41)
Albumin: 3.6 g/dL (ref 3.5–5.0)
Alkaline Phosphatase: 60 U/L (ref 38–126)
Anion gap: 9 (ref 5–15)
BUN: 21 mg/dL (ref 8–23)
CO2: 28 mmol/L (ref 22–32)
Calcium: 9 mg/dL (ref 8.9–10.3)
Chloride: 100 mmol/L (ref 98–111)
Creatinine, Ser: 1.17 mg/dL (ref 0.61–1.24)
GFR calc Af Amer: >60 mL/min (ref >60)
GFR calc non Af Amer: >60 mL/min (ref >60)
Glucose, Bld: 165 mg/dL — ABNORMAL HIGH (ref 70–99)
Potassium: 2.9 mmol/L — ABNORMAL LOW (ref 3.5–5.1)
Sodium: 137 mmol/L (ref 135–145)
Total Bilirubin: 0.6 mg/dL (ref 0.3–1.2)
Total Protein: 7.3 g/dL (ref 6.5–8.1)

## 2019-08-16 LAB — CBC WITH DIFFERENTIAL/PLATELET
Abs Immature Granulocytes: 0.01 10*3/uL (ref 0.00–0.07)
Basophils Absolute: 0 10*3/uL (ref 0.0–0.1)
Basophils Relative: 1 %
Eosinophils Absolute: 0.2 10*3/uL (ref 0.0–0.5)
Eosinophils Relative: 5 %
HCT: 40.4 % (ref 39.0–52.0)
Hemoglobin: 13 g/dL (ref 13.0–17.0)
Immature Granulocytes: 0 %
Lymphocytes Relative: 43 %
Lymphs Abs: 1.4 10*3/uL (ref 0.7–4.0)
MCH: 27.5 pg (ref 26.0–34.0)
MCHC: 32.2 g/dL (ref 30.0–36.0)
MCV: 85.6 fL (ref 80.0–100.0)
Monocytes Absolute: 0.4 10*3/uL (ref 0.1–1.0)
Monocytes Relative: 13 %
Neutro Abs: 1.2 10*3/uL — ABNORMAL LOW (ref 1.7–7.7)
Neutrophils Relative %: 38 %
Platelets: 180 10*3/uL (ref 150–400)
RBC: 4.72 MIL/uL (ref 4.22–5.81)
RDW: 14.1 % (ref 11.5–15.5)
WBC: 3.3 10*3/uL — ABNORMAL LOW (ref 4.0–10.5)
nRBC: 0 % (ref 0.0–0.2)

## 2019-08-16 LAB — PATHOLOGIST SMEAR REVIEW

## 2019-08-16 MED ORDER — POTASSIUM CHLORIDE CRYS ER 20 MEQ PO TBCR: 20.0000 meq | EXTENDED_RELEASE_TABLET | Freq: Every day | ORAL | 0 refills | Status: AC

## 2019-08-16 NOTE — Progress Notes (Signed)
Patient has 11 lb wt loss despite appetite is "too good".

## 2019-08-16 NOTE — Telephone Encounter (Signed)
Call to PCP to inform them that his K+ in office today is 2.9 and he does take HCTZ as prescribed by PCP.  They can work him in to see Dr. Edwina Barth on 08/20/19 @ 1:15.  Dr. Mike Gip approves to send rx for K+ 20 MEQ for 3 days to his local pharmacy and patient advised to eat potassium rich foods as well.

## 2019-08-23 ENCOUNTER — Encounter: Payer: Self-pay | Admitting: Radiation Oncology

## 2019-08-23 ENCOUNTER — Ambulatory Visit
Admission: RE | Admit: 2019-08-23 | Discharge: 2019-08-23 | Disposition: A | Discharge status: Home / Self Care | Payer: Medicare Other | Source: Ambulatory Visit | Attending: Radiation Oncology | Admitting: Radiation Oncology

## 2019-08-23 ENCOUNTER — Other Ambulatory Visit: Payer: Self-pay

## 2019-08-23 VITALS — BP 109/61 | HR 67 | Temp 96.2°F | Resp 16 | Wt 197.3 lb

## 2019-08-23 DIAGNOSIS — Z85118 Personal history of other malignant neoplasm of bronchus and lung: Secondary | ICD-10-CM | POA: Insufficient documentation

## 2019-08-23 DIAGNOSIS — Z923 Personal history of irradiation: Secondary | ICD-10-CM | POA: Insufficient documentation

## 2019-08-23 DIAGNOSIS — C3411 Malignant neoplasm of upper lobe, right bronchus or lung: Secondary | ICD-10-CM

## 2019-08-23 NOTE — Progress Notes (Signed)
Radiation Oncology Follow up Note  Name: Ruben Diaz   Date:   08/23/2019 MRN:  [id number] DOB: 1945-12-25    This 74 y.o. male presents to the clinic today for 2-year follow-up status post SBRT to right upper lobe for stage I adenocarcinoma.  REFERRING PROVIDER: Baxter Hire, MD  HPI: Patient is a 74 year old male now 2 years having completed SBRT to his right upper lobe for stage I adenocarcinoma seen today in routine follow-up he is doing well.  He specifically denies cough hemoptysis chest tightness..  He had a recent CT scan showing stable posttreatment related changes in anterior right upper lobe no evidence of progressive disease.  He is followed by Dr. Erlene Quan for his gross hematuria.  COMPLICATIONS OF TREATMENT: none  FOLLOW UP COMPLIANCE: keeps appointments   PHYSICAL EXAM:  BP 109/61 (BP Location: Left Arm, Patient Position: Sitting, Cuff Size: Large)   Pulse 67   Temp (!) 96.2 F (35.7 C) (Tympanic)   Resp 16   Wt 197 lb 4.8 oz (89.5 kg)   BMI 26.76 kg/m  Well-developed well-nourished patient in NAD. HEENT reveals PERLA, EOMI, discs not visualized.  Oral cavity is clear. No oral mucosal lesions are identified. Neck is clear without evidence of cervical or supraclavicular adenopathy. Lungs are clear to A&P. Cardiac examination is essentially unremarkable with regular rate and rhythm without murmur rub or thrill. Abdomen is benign with no organomegaly or masses noted. Motor sensory and DTR levels are equal and symmetric in the upper and lower extremities. Cranial nerves II through XII are grossly intact. Proprioception is intact. No peripheral adenopathy or edema is identified. No motor or sensory levels are noted. Crude visual fields are within normal range.  RADIOLOGY RESULTS: CT scan reviewed compatible with above-stated findings  PLAN: Present time patient is doing well stable CT findings.  Pleased with overall progress.  Of asked to see him back in 1 year  for follow-up.  Patient knows to call with any concerns.  I would like to take this opportunity to thank you for allowing me to participate in the care of your patient.Noreene Filbert, MD

## 2019-08-23 NOTE — Progress Notes (Signed)
Radiation Oncology Follow up Note  Name: Ruben Diaz   Date:   08/23/2019 MRN:  [id number] DOB: 1945-05-26    This 74 y.o. male presents to the clinic today for 2-year follow-up status post SBRT to right upper lobe for stage I adenocarcinoma.  REFERRING PROVIDER: Baxter Hire, MD  HPI: Patient is a 74 year old male now 2 years having completed SBRT to his right upper lobe for stage I adenocarcinoma seen today in routine follow-up he is doing well.  He specifically denies cough hemoptysis chest tightness..  He had a recent CT scan showing stable posttreatment related changes in anterior right upper lobe no evidence of progressive disease.  COMPLICATIONS OF TREATMENT: none  FOLLOW UP COMPLIANCE: keeps appointments   PHYSICAL EXAM:  BP 109/61 (BP Location: Left Arm, Patient Position: Sitting, Cuff Size: Large)   Pulse 67   Temp (!) 96.2 F (35.7 C) (Tympanic)   Resp 16   Wt 197 lb 4.8 oz (89.5 kg)   BMI 26.76 kg/m  Well-developed well-nourished patient in NAD. HEENT reveals PERLA, EOMI, discs not visualized.  Oral cavity is clear. No oral mucosal lesions are identified. Neck is clear without evidence of cervical or supraclavicular adenopathy. Lungs are clear to A&P. Cardiac examination is essentially unremarkable with regular rate and rhythm without murmur rub or thrill. Abdomen is benign with no organomegaly or masses noted. Motor sensory and DTR levels are equal and symmetric in the upper and lower extremities. Cranial nerves II through XII are grossly intact. Proprioception is intact. No peripheral adenopathy or edema is identified. No motor or sensory levels are noted. Crude visual fields are within normal range.  RADIOLOGY RESULTS: CT scans reviewed compatible with above-stated findings  PLAN: Patient is now 2 years out with no evidence of disease.  I am pleased with his overall progress.  I have asked to see him back in 1 year for follow-up.  Patient knows to call at  anytime with any concerns.  I would like to take this opportunity to thank you for allowing me to participate in the care of your patient.Noreene Filbert, MD

## 2019-08-30 ENCOUNTER — Other Ambulatory Visit: Payer: Self-pay

## 2019-08-30 ENCOUNTER — Inpatient Hospital Stay: Payer: Medicare Other | Attending: Hematology and Oncology

## 2019-08-30 DIAGNOSIS — C3411 Malignant neoplasm of upper lobe, right bronchus or lung: Secondary | ICD-10-CM | POA: Insufficient documentation

## 2019-08-30 LAB — CBC WITH DIFFERENTIAL/PLATELET
Abs Immature Granulocytes: 0.01 10*3/uL (ref 0.00–0.07)
Basophils Absolute: 0.1 10*3/uL (ref 0.0–0.1)
Basophils Relative: 1 %
Eosinophils Absolute: 0.2 10*3/uL (ref 0.0–0.5)
Eosinophils Relative: 4 %
HCT: 39.8 % (ref 39.0–52.0)
Hemoglobin: 12.8 g/dL — ABNORMAL LOW (ref 13.0–17.0)
Immature Granulocytes: 0 %
Lymphocytes Relative: 40 %
Lymphs Abs: 1.7 10*3/uL (ref 0.7–4.0)
MCH: 27.3 pg (ref 26.0–34.0)
MCHC: 32.2 g/dL (ref 30.0–36.0)
MCV: 84.9 fL (ref 80.0–100.0)
Monocytes Absolute: 0.5 10*3/uL (ref 0.1–1.0)
Monocytes Relative: 13 %
Neutro Abs: 1.8 10*3/uL (ref 1.7–7.7)
Neutrophils Relative %: 42 %
Platelets: 211 10*3/uL (ref 150–400)
RBC: 4.69 MIL/uL (ref 4.22–5.81)
RDW: 14.6 % (ref 11.5–15.5)
WBC: 4.2 10*3/uL (ref 4.0–10.5)
nRBC: 0 % (ref 0.0–0.2)

## 2019-10-17 NOTE — Progress Notes (Signed)
10/18/2019 8:11 AM   Ruben Diaz 1946-03-13 [id number]  Referring provider: Baxter Hire, MD [place],   [id number]  Chief Complaint  Patient presents with  . Hematuria    HPI: Ruben Diaz is a 74 y.o. male with microscopic hematuria and BPH with LU TS who presents today for a three month follow up.  Hematuria (high risk) Former smoker.  CTU 06/15/2019 Normal adrenals. No right renal stones. Nonobstructing 2 mm interpolar left renal stone. No hydronephrosis. Normal caliber ureters. No ureteral stones. Subcentimeter hypodense renal cortical lesions scattered in the left kidney, too small to characterize, requiring no follow-up. No suspicious renal cortical masses. On delayed imaging, there is no urothelial wall thickening and there are no filling defects in the opacified portions of the bilateral collecting systems or ureters. No bladder stones, masses or diverticula. Mild diffuse bladder wall thickening and trabeculation.  Mild prostatomegaly with nonspecific internal prostatic calcifications.  Cysto 07/18/2019 with Dr. Erlene Quan enlarged prostate w/ trilobar coaptation and intravesical protrusion into bladder  Moderately trabeculated bladder w/ saccules.  He denies gross hematuria.  He states he had some dark-colored urine 2 days ago, but his UA today is clear.    BPH WITH LUTS  (prostate and/or bladder) IPSS score: 7/1   PVR: 56 mL     Major complaint(s):  Mild hesitancy for the last few weeks.  Denies any dysuria, hematuria or suprapubic pain.   Denies any recent fevers, chills, nausea or vomiting.   IPSS    Row Name 10/18/19 1300         International Prostate Symptom Score   How often have you had the sensation of not emptying your bladder? Less than 1 in 5     How often have you had to urinate less than every two hours? Less than 1 in 5 times     How often have you found you stopped and started again several times when you  urinated? Not at All     How often have you found it difficult to postpone urination? Less than 1 in 5 times     How often have you had a weak urinary stream? Not at All     How often have you had to strain to start urination? Not at All     How many times did you typically get up at night to urinate? 4 Times     Total IPSS Score 7       Quality of Life due to urinary symptoms   If you were to spend the rest of your life with your urinary condition just the way it is now how would you feel about that? Pleased            Score:  1-7 Mild 8-19 Moderate 20-35 Severe  PMH: Past Medical History:  Diagnosis Date  . Cancer ([place])   . Chronic kidney disease   . Dysphagia   . GERD (gastroesophageal reflux disease)   . History of BPH   . Hyperlipidemia   . Hypertension   . Myocardial infarction ([place])   . Peripheral vascular disease [facility])     Surgical History: Past Surgical History:  Procedure Laterality Date  . APPENDECTOMY    . COLONOSCOPY WITH PROPOFOL N/A 09/16/2016   Procedure: COLONOSCOPY WITH PROPOFOL;  Surgeon: [name], MD;  Location: [facility] ENDOSCOPY;  Service: Endoscopy;  Laterality: N/A;  . ESOPHAGOGASTRODUODENOSCOPY (EGD) WITH PROPOFOL N/A 09/16/2016   Procedure: ESOPHAGOGASTRODUODENOSCOPY (  EGD) WITH PROPOFOL;  Surgeon: [name], MD;  Location: [facility] ENDOSCOPY;  Service: Endoscopy;  Laterality: N/A;  . ESOPHAGOGASTRODUODENOSCOPY (EGD) WITH PROPOFOL N/A 12/16/2016   Procedure: ESOPHAGOGASTRODUODENOSCOPY (EGD) WITH PROPOFOL;  Surgeon: [name], MD;  Location: [facility] ENDOSCOPY;  Service: Endoscopy;  Laterality: N/A;  . STOMACH SURGERY      Home Medications:  Allergies as of 10/18/2019      Reactions   Sucralfate Other (See Comments)   dizziness      Medication List       Accurate as of October 18, 2019 11:59 PM. If you have any questions, ask your nurse or doctor.        amLODipine 10 MG tablet Commonly known as: NORVASC TAKE 1 TABLET BY  MOUTH ONCE DAILY   aspirin EC 81 MG tablet Take 81 mg by mouth daily.   atorvastatin 10 MG tablet Commonly known as: LIPITOR Take 10 mg by mouth daily.   hydrochlorothiazide 25 MG tablet Commonly known as: HYDRODIURIL Take 1 tablet (25 mg total) by mouth daily.   magnesium oxide 400 MG tablet Commonly known as: MAG-OX Take by mouth.   pantoprazole 40 MG tablet Commonly known as: PROTONIX Take 1 tablet by mouth daily.   polyethylene glycol 17 g packet Commonly known as: MIRALAX / GLYCOLAX Take 17 g by mouth daily as needed.   potassium chloride SA 20 MEQ tablet Commonly known as: KLOR-CON Take 1 tablet (20 mEq total) by mouth daily for 3 days.       Allergies:  Allergies  Allergen Reactions  . Sucralfate Other (See Comments)    dizziness    Family History: No family history on file.  Social History:  reports that he quit smoking about 8 years ago. He has a 35.70 pack-year smoking history. He has never used smokeless tobacco. He reports that he does not drink alcohol and does not use drugs.  ROS: Pertinent ROS in HPI  Physical Exam: BP 124/72   Pulse 62   Ht 6' (1.829 m)   Wt 195 lb (88.5 kg)   BMI 26.45 kg/m   Constitutional:  Well nourished. Alert and oriented, No acute distress. HEENT: [place] AT, mask in place.  Trachea midline Cardiovascular: No clubbing, cyanosis, or edema. Respiratory: Normal respiratory effort, no increased work of breathing. Neurologic: Grossly intact, no focal deficits, moving all 4 extremities. Psychiatric: Normal mood and affect.  Laboratory Data: Lab Results  Component Value Date   WBC 4.2 08/30/2019   HGB 12.8 (L) 08/30/2019   HCT 39.8 08/30/2019   MCV 84.9 08/30/2019   PLT 211 08/30/2019    Lab Results  Component Value Date   CREATININE 1.17 08/16/2019    Lab Results  Component Value Date   PSA 1.9 12/15/2010    Lab Results  Component Value Date   AST 24 08/16/2019   Lab Results  Component Value Date   ALT  13 08/16/2019    Urinalysis Component     Latest Ref Rng & Units 10/18/2019  Specific Gravity, UA     1.005 - 1.030 1.025  pH, UA     5.0 - 7.5 5.0  Color, UA     Yellow Yellow  Appearance Ur     Clear Clear  Leukocytes,UA     Negative Negative  Protein,UA     Negative/Trace Negative  Glucose, UA     Negative Negative  Ketones, UA     Negative Negative  RBC, UA  Negative 2+ (A)  Bilirubin, UA     Negative Negative  Urobilinogen, Ur     0.2 - 1.0 mg/dL 0.2  Nitrite, UA     Negative Negative  Microscopic Examination      See below:   Component     Latest Ref Rng & Units 10/18/2019  WBC, UA     0 - 5 /hpf 0-5  RBC     0 - 2 /hpf 0-2  Epithelial Cells (non renal)     0 - 10 /hpf 0-10  Bacteria, UA     None seen/Few None seen    I have reviewed the labs.   Pertinent Imaging: Results for TYLEEK, SMICK (MRN [id number]) as of 10/18/2019 13:07  Ref. Range 10/18/2019 13:03  Scan Result Unknown 56 ML   Assessment & Plan:   1. Microscopic hematuria Completed workup in 06/2019- findings positive for Nonobstructing 2 mm interpolar left renal stone and enlarged prostate w/ trilobar coaptation and intravesical protrusion into bladder  Moderately trabeculated bladder w/ saccules UA today is clear Patient states he has noted dark-colored urine, I explained that this may be the result of dehydration or blood in the urine.  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 BPH with LUTS IPSS score is 7/1 Continue conservative management, avoiding bladder irritants and timed voiding's Most bothersome symptoms is/are infrequent hesitancy Still not wanting to start medication RTC in 4 months for I PSS, PVR and exam    Return in about 4 months (around 02/18/2020) for IPSS, PVR, UA and exam.  These notes generated with voice recognition software. I apologize for typographical  errors.  Zara Council, PA-C  [facility] [address]  [place], [place] [address] [phone]

## 2019-10-18 ENCOUNTER — Ambulatory Visit (INDEPENDENT_AMBULATORY_CARE_PROVIDER_SITE_OTHER): Payer: Medicare Other | Admitting: Urology

## 2019-10-18 ENCOUNTER — Other Ambulatory Visit: Payer: Self-pay

## 2019-10-18 ENCOUNTER — Encounter: Payer: Self-pay | Admitting: Urology

## 2019-10-18 VITALS — BP 124/72 | HR 62 | Ht 72.0 in | Wt 195.0 lb

## 2019-10-18 DIAGNOSIS — R3911 Hesitancy of micturition: Secondary | ICD-10-CM | POA: Diagnosis not present

## 2019-10-18 DIAGNOSIS — R3129 Other microscopic hematuria: Secondary | ICD-10-CM | POA: Diagnosis not present

## 2019-10-18 DIAGNOSIS — N401 Enlarged prostate with lower urinary tract symptoms: Secondary | ICD-10-CM | POA: Diagnosis not present

## 2019-10-18 LAB — URINALYSIS, COMPLETE
Bilirubin, UA: NEGATIVE
Glucose, UA: NEGATIVE
Ketones, UA: NEGATIVE
Leukocytes,UA: NEGATIVE
Nitrite, UA: NEGATIVE
Protein,UA: NEGATIVE
Specific Gravity, UA: 1.025 (ref 1.005–1.030)
Urobilinogen, Ur: 0.2 mg/dL (ref 0.2–1.0)
pH, UA: 5 (ref 5.0–7.5)

## 2019-10-18 LAB — MICROSCOPIC EXAMINATION: Bacteria, UA: NONE SEEN

## 2019-10-18 LAB — BLADDER SCAN AMB NON-IMAGING: Scan Result: 56

## 2019-10-30 IMAGING — MR MR ABDOMEN WO/W CM
16 of 17 series · 43 of 48 positions shown · IV contrast (19 ML MULTIHANCE)
Comparison: PET-CT on 05/04/2017

CLINICAL DATA: Newly diagnosed right lung carcinoma. Hypermetabolic
liver lesion on recent PET-CT.

EXAM:
MRI ABDOMEN WITHOUT AND WITH CONTRAST
TECHNIQUE: Multiplanar multisequence MR imaging of the abdomen was performed
both before and after the administration of intravenous contrast.
CONTRAST:  19mL MULTIHANCE GADOBENATE DIMEGLUMINE 529 MG/ML IV SOLN

[Series 2: cor ssfse / · coronal · 7.0mm · 1.48mm/px · 2 of 30 slices shown]
[im 1/30]
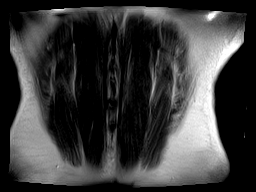
[im 30/30]
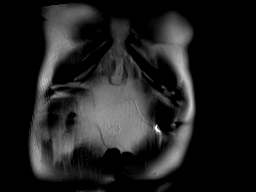

[Series 3: T2 fat-sat · axial · 6.5mm · 1.48mm/px · z∈[-146,+96]mm · 2 of 32 slices shown]
[im 1/32]
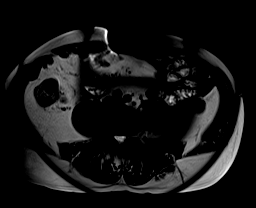
[im 32/32]
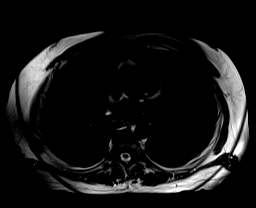

[Series 4: T1 · axial · 6.5mm · 0.74mm/px · z∈[-146,+96]mm · 4 of 64 slices shown]
[im 1/64]
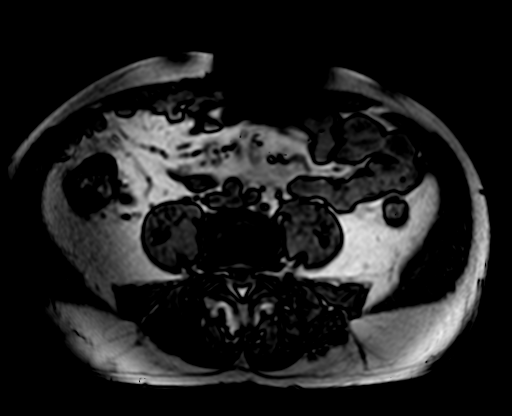
[im 22/64]
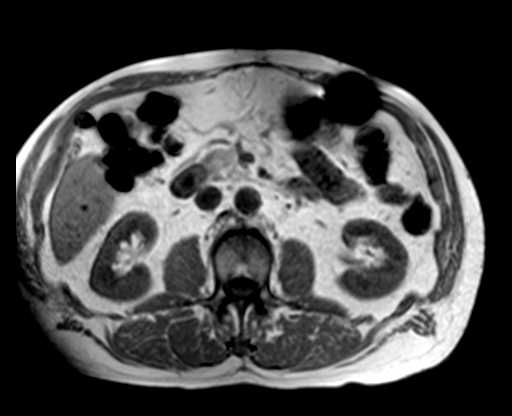
[im 43/64]
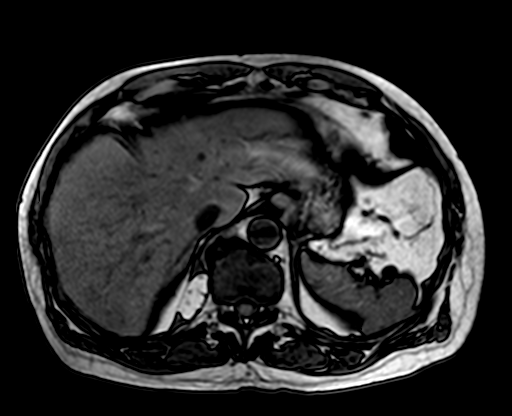
[im 64/64]
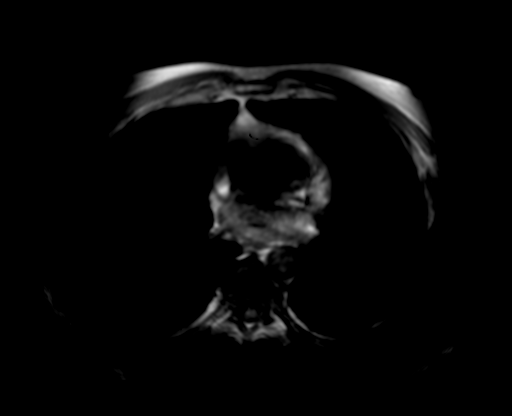

[Series 5: bSSFP · axial · 6.5mm · 0.74mm/px · z∈[-146,+96]mm · 2 of 32 slices shown]
[im 1/32]
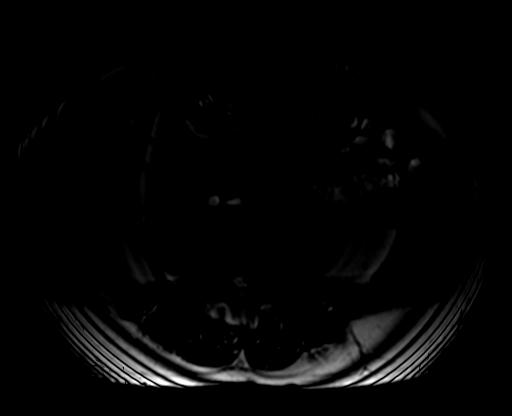
[im 32/32]
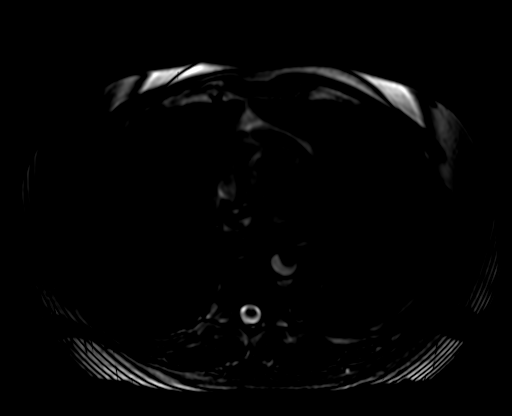

[Series 6: axial dynamic pre · axial · non-contrast · 3.0mm · 1.19mm/px · z∈[-136,+77]mm · 3 of 72 slices shown]
[im 1/72]
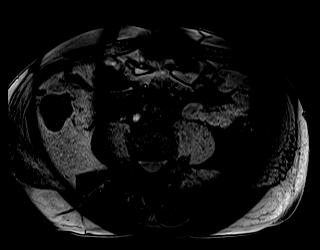
[im 36/72]
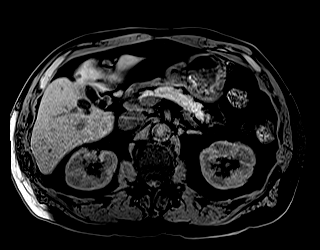
[im 72/72]
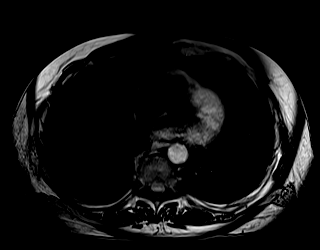

[Series 7: DWI · axial · 6.5mm · 2.00mm/px · z∈[-146,+96]mm · 4 of 92 slices shown (1 of 2)]
[im 1/92]
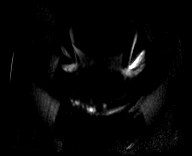
[im 31/92]
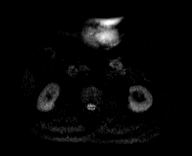
[im 61/92]
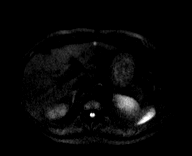
[im 92/92]
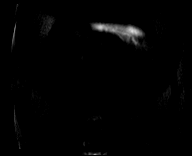

[Series 8: DWI · axial · 6.5mm · 2.00mm/px · 1 of 32 slices shown (2 of 2)]
[im 1/32]
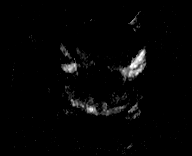

[Series 9: axial dynamic post · axial · 3.0mm · 1.19mm/px · z∈[-136,+77]mm · 3 of 72 slices shown (1 of 6)]
[im 1/72]
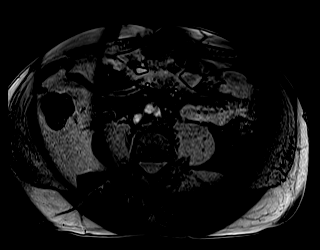
[im 36/72]
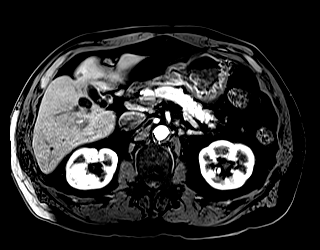
[im 72/72]
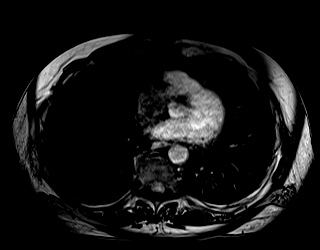

[Series 10: axial dynamic post · axial · 3.0mm · 1.19mm/px · z∈[-136,+77]mm · 3 of 72 slices shown (2 of 6)]
[im 1/72]
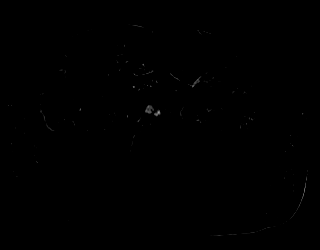
[im 36/72]
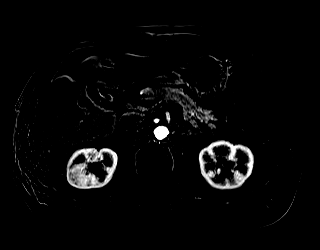
[im 72/72]
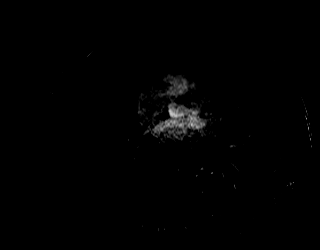

[Series 11: axial dynamic post · axial · 3.0mm · 1.19mm/px · z∈[-136,+77]mm · 3 of 72 slices shown (3 of 6)]
[im 1/72]
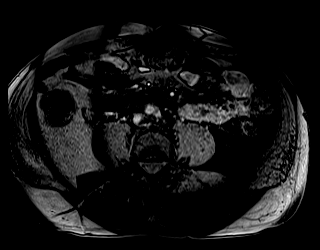
[im 36/72]
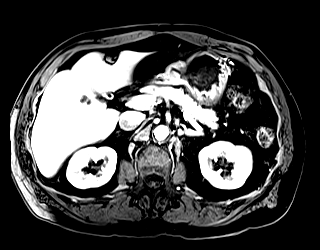
[im 72/72]
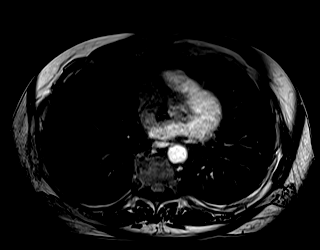

[Series 12: axial dynamic post · axial · 3.0mm · 1.19mm/px · z∈[-136,+77]mm · 3 of 72 slices shown (4 of 6)]
[im 1/72]
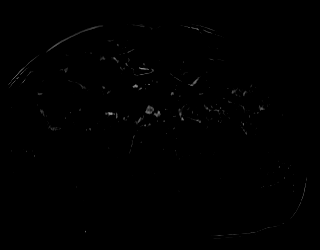
[im 36/72]
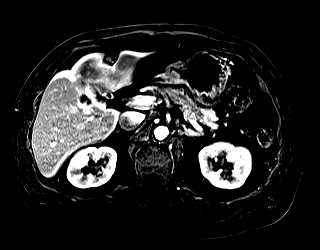
[im 72/72]
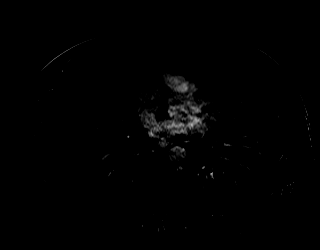

[Series 13: axial dynamic post · axial · 3.0mm · 1.19mm/px · z∈[-136,+77]mm · 3 of 72 slices shown (5 of 6)]
[im 1/72]
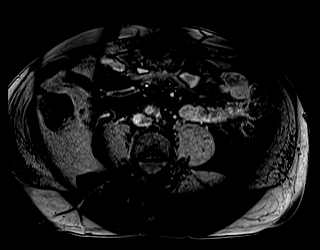
[im 36/72]
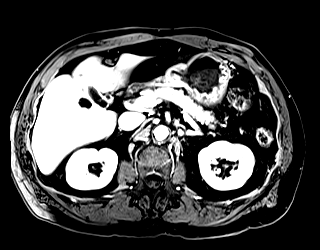
[im 72/72]
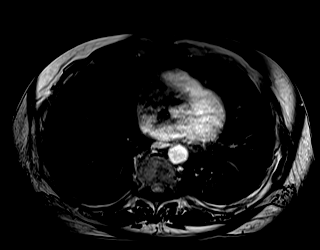

[Series 14: axial dynamic post · axial · 3.0mm · 1.19mm/px · z∈[-136,+77]mm · 3 of 72 slices shown (6 of 6)]
[im 1/72]
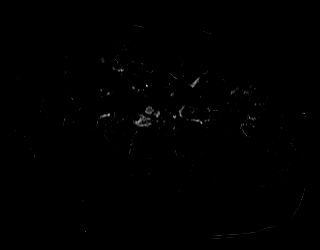
[im 36/72]
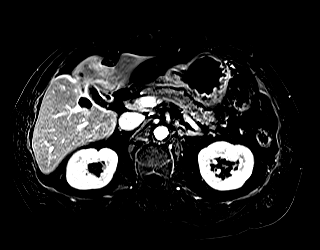
[im 72/72]
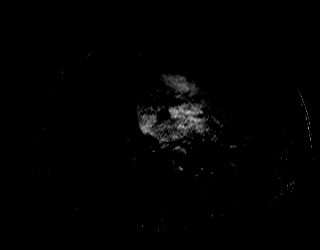

[Series 16: axial dynamic delayed · axial · 3.0mm · 1.19mm/px · z∈[-136,+77]mm · 3 of 72 slices shown]
[im 1/72]
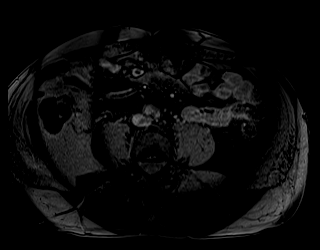
[im 36/72]
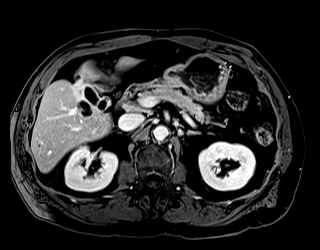
[im 72/72]
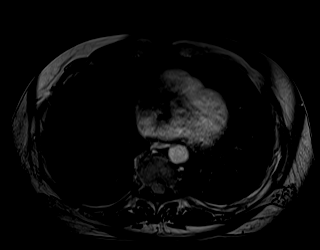

[Series 17: axial dynamic delayed_sub · axial · 3.0mm · 1.19mm/px · z∈[-136,+77]mm · 3 of 72 slices shown]
[im 1/72]
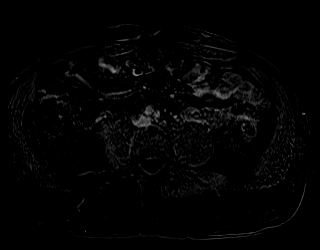
[im 36/72]
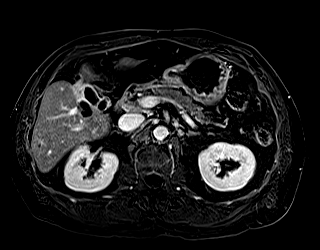
[im 72/72]
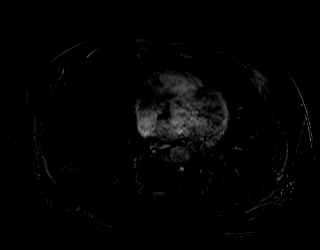

[Series 18: axial ssfse / · axial · 6.5mm · 1.19mm/px · 1 of 32 slices shown]
[im 1/32]
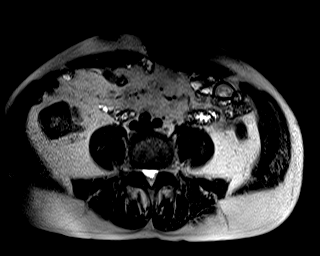

[43 of 48 positions shown; findings below may reference images not displayed]

FINDINGS: Lower chest: No acute findings.

Hepatobiliary: Multiple small cysts are seen in both the right and
left hepatic lobes, however no masses are identified. A small
gallbladder is seen which is filled with small gallstones. No
evidence of acute cholecystitis or biliary ductal dilatation.

Pancreas:  No mass or inflammatory changes.

Spleen:  Within normal limits in size and appearance.

Adrenals/Urinary Tract: No masses identified. No evidence of
hydronephrosis.

Stomach/Bowel: Visualized abdominal bowel unremarkable.

Vascular/Lymphatic: No pathologically enlarged lymph nodes
identified. No abdominal aortic aneurysm.

Other:  None.

Musculoskeletal:  No suspicious bone lesions identified.
IMPRESSION: No evidence of hepatic or other abdominal metastatic disease.

Cholelithiasis.  No radiographic evidence of cholecystitis.

## 2019-11-05 ENCOUNTER — Other Ambulatory Visit: Payer: Self-pay

## 2019-11-05 ENCOUNTER — Other Ambulatory Visit
Admission: RE | Admit: 2019-11-05 | Discharge: 2019-11-05 | Disposition: A | Discharge status: Home / Self Care | Payer: Medicare Other | Source: Ambulatory Visit | Attending: Internal Medicine | Admitting: Internal Medicine

## 2019-11-05 DIAGNOSIS — Z01812 Encounter for preprocedural laboratory examination: Secondary | ICD-10-CM | POA: Insufficient documentation

## 2019-11-05 DIAGNOSIS — Z20822 Contact with and (suspected) exposure to covid-19: Secondary | ICD-10-CM | POA: Diagnosis not present

## 2019-11-05 LAB — SARS CORONAVIRUS 2 (TAT 6-24 HRS): SARS Coronavirus 2: NEGATIVE

## 2019-11-06 ENCOUNTER — Encounter: Payer: Self-pay | Admitting: Internal Medicine

## 2019-11-07 ENCOUNTER — Ambulatory Visit: Payer: Medicare Other | Admitting: Anesthesiology

## 2019-11-07 ENCOUNTER — Ambulatory Visit
Admission: RE | Admit: 2019-11-07 | Discharge: 2019-11-07 | Disposition: A | Discharge status: Home / Self Care | Payer: Medicare Other | Attending: Internal Medicine | Admitting: Internal Medicine

## 2019-11-07 ENCOUNTER — Encounter: Payer: Self-pay | Admitting: Internal Medicine

## 2019-11-07 ENCOUNTER — Encounter
Admission: RE | Disposition: A | Discharge status: Home / Self Care | Payer: Self-pay | Source: Home / Self Care | Attending: Internal Medicine

## 2019-11-07 DIAGNOSIS — Z79899 Other long term (current) drug therapy: Secondary | ICD-10-CM | POA: Diagnosis not present

## 2019-11-07 DIAGNOSIS — K5730 Diverticulosis of large intestine without perforation or abscess without bleeding: Secondary | ICD-10-CM | POA: Insufficient documentation

## 2019-11-07 DIAGNOSIS — I129 Hypertensive chronic kidney disease with stage 1 through stage 4 chronic kidney disease, or unspecified chronic kidney disease: Secondary | ICD-10-CM | POA: Insufficient documentation

## 2019-11-07 DIAGNOSIS — N189 Chronic kidney disease, unspecified: Secondary | ICD-10-CM | POA: Diagnosis not present

## 2019-11-07 DIAGNOSIS — Z87891 Personal history of nicotine dependence: Secondary | ICD-10-CM | POA: Insufficient documentation

## 2019-11-07 DIAGNOSIS — K219 Gastro-esophageal reflux disease without esophagitis: Secondary | ICD-10-CM | POA: Diagnosis not present

## 2019-11-07 DIAGNOSIS — I252 Old myocardial infarction: Secondary | ICD-10-CM | POA: Insufficient documentation

## 2019-11-07 DIAGNOSIS — D123 Benign neoplasm of transverse colon: Secondary | ICD-10-CM | POA: Diagnosis not present

## 2019-11-07 DIAGNOSIS — Z7982 Long term (current) use of aspirin: Secondary | ICD-10-CM | POA: Insufficient documentation

## 2019-11-07 DIAGNOSIS — E785 Hyperlipidemia, unspecified: Secondary | ICD-10-CM | POA: Diagnosis not present

## 2019-11-07 DIAGNOSIS — I739 Peripheral vascular disease, unspecified: Secondary | ICD-10-CM | POA: Insufficient documentation

## 2019-11-07 DIAGNOSIS — Z1211 Encounter for screening for malignant neoplasm of colon: Secondary | ICD-10-CM | POA: Insufficient documentation

## 2019-11-07 DIAGNOSIS — K640 First degree hemorrhoids: Secondary | ICD-10-CM | POA: Diagnosis not present

## 2019-11-07 HISTORY — PX: COLONOSCOPY WITH PROPOFOL: SHX5780

## 2019-11-07 SURGERY — COLONOSCOPY WITH PROPOFOL
Anesthesia: General

## 2019-11-07 MED ORDER — PROPOFOL 500 MG/50ML IV EMUL
INTRAVENOUS | Status: DC | PRN
  Administered 2019-11-07: 155 ug/kg/min via INTRAVENOUS

## 2019-11-07 MED ORDER — LIDOCAINE HCL (CARDIAC) PF 100 MG/5ML IV SOSY
PREFILLED_SYRINGE | INTRAVENOUS | Status: DC | PRN
  Administered 2019-11-07: 100 mg via INTRAVENOUS

## 2019-11-07 MED ORDER — SODIUM CHLORIDE 0.9 % IV SOLN: INTRAVENOUS | Status: DC

## 2019-11-07 MED ORDER — PROPOFOL 10 MG/ML IV BOLUS
INTRAVENOUS | Status: DC | PRN
  Administered 2019-11-07: 10 mg via INTRAVENOUS
  Administered 2019-11-07: 40 mg via INTRAVENOUS

## 2019-11-07 NOTE — Anesthesia Preprocedure Evaluation (Signed)
Anesthesia Evaluation  Patient identified by MRN, date of birth, ID band Patient awake    Reviewed: Allergy & Precautions, H&P , NPO status , Patient's Chart, lab work & pertinent test results  History of Anesthesia Complications Negative for: history of anesthetic complications  Airway Mallampati: II  TM Distance: >3 FB Neck ROM: full    Dental  (+) Chipped   Pulmonary neg pulmonary ROS, neg shortness of breath, former smoker,    Pulmonary exam normal        Cardiovascular Exercise Tolerance: Good hypertension, (-) angina+ Past MI and + Peripheral Vascular Disease  Normal cardiovascular exam     Neuro/Psych negative neurological ROS  negative psych ROS   GI/Hepatic Neg liver ROS, GERD  Medicated and Controlled,  Endo/Other  negative endocrine ROS  Renal/GU Renal disease  negative genitourinary   Musculoskeletal   Abdominal   Peds  Hematology negative hematology ROS (+)   Anesthesia Other Findings Past Medical History: No date: Cancer ([place]) No date: Chronic kidney disease No date: Dysphagia No date: GERD (gastroesophageal reflux disease) No date: History of BPH No date: Hyperlipidemia No date: Hypertension No date: Myocardial infarction ([place]) No date: Peripheral vascular disease ([place])  Past Surgical History: No date: APPENDECTOMY 09/16/2016: COLONOSCOPY WITH PROPOFOL; N/A     Comment:  Procedure: COLONOSCOPY WITH PROPOFOL;  Surgeon:               [name], MD;  Location: [facility] ENDOSCOPY;                Service: Endoscopy;  Laterality: N/A; 09/16/2016: ESOPHAGOGASTRODUODENOSCOPY (EGD) WITH PROPOFOL; N/A     Comment:  Procedure: ESOPHAGOGASTRODUODENOSCOPY (EGD) WITH               PROPOFOL;  Surgeon: [name], MD;  Location:               [facility] ENDOSCOPY;  Service: Endoscopy;  Laterality: N/A; 12/16/2016: ESOPHAGOGASTRODUODENOSCOPY (EGD) WITH PROPOFOL; N/A     Comment:  Procedure:  ESOPHAGOGASTRODUODENOSCOPY (EGD) WITH               PROPOFOL;  Surgeon: [name], MD;  [facility]               [facility] ENDOSCOPY;  Service: Endoscopy;  Laterality: N/A; No date: STOMACH SURGERY  BMI    Body Mass Index: 26.58 kg/m      Reproductive/Obstetrics negative OB ROS                             Anesthesia Physical Anesthesia Plan  ASA: III  Anesthesia Plan: General   Post-op Pain Management:    Induction: Intravenous  PONV Risk Score and Plan: Propofol infusion and TIVA  Airway Management Planned: Natural Airway and Nasal Cannula  Additional Equipment:   Intra-op Plan:   Post-operative Plan:   Informed Consent: I have reviewed the patients History and Physical, chart, labs and discussed the procedure including the risks, benefits and alternatives for the proposed anesthesia with the patient or authorized representative who has indicated his/her understanding and acceptance.     Dental Advisory Given  Plan Discussed with: Anesthesiologist, CRNA and Surgeon  Anesthesia Plan Comments: (Patient consented for risks of anesthesia including but not limited to:  - adverse reactions to medications - risk of intubation if required - damage to eyes, teeth, lips or other oral mucosa - nerve damage due to positioning  - sore throat  or hoarseness - Damage to heart, brain, nerves, lungs, other parts of body or loss of life  Patient voiced understanding.)        Anesthesia Quick Evaluation

## 2019-11-07 NOTE — Interval H&P Note (Signed)
History and Physical Interval Note:  11/07/2019 2:32 PM  Ruben Diaz  has presented today for surgery, with the diagnosis of P HX POLYPS.  The various methods of treatment have been discussed with the patient and family. After consideration of risks, benefits and other options for treatment, the patient has consented to  Procedure(s): COLONOSCOPY WITH PROPOFOL (N/A) as a surgical intervention.  The patient's history has been reviewed, patient examined, no change in status, stable for surgery.  I have reviewed the patient's chart and labs.  Questions were answered to the patient's satisfaction.     [place], [place]

## 2019-11-07 NOTE — H&P (Signed)
Outpatient short stay form Pre-procedure 11/07/2019 2:31 PM Ruben Diaz K. Alice Reichert, M.D.  Primary Physician: Harrel Lemon, M.D.  Reason for visit:  Personal history of adenomatous colon polyps  History of present illness:                           Patient presents for colonoscopy for a personal hx of colon polyps. The patient denies abdominal pain, abnormal weight loss or rectal bleeding.      Current Facility-Administered Medications:  .  0.9 %  sodium chloride infusion, , Intravenous, Continuous, [place], Benay Pike, MD, Last Rate: 20 mL/hr at 11/07/19 1412, New Bag at 11/07/19 1412  Medications Prior to Admission  Medication Sig Dispense Refill Last Dose  . amLODipine (NORVASC) 10 MG tablet TAKE 1 TABLET BY MOUTH ONCE DAILY 90 tablet 0 11/07/2019 at 0555  . aspirin EC 81 MG tablet Take 81 mg by mouth daily.   11/06/2019  . atorvastatin (LIPITOR) 10 MG tablet Take 10 mg by mouth daily.   11/07/2019 at 0555  . hydrochlorothiazide (HYDRODIURIL) 25 MG tablet Take 1 tablet (25 mg total) by mouth daily. 30 tablet 3 11/07/2019 at 0555  . magnesium oxide (MAG-OX) 400 MG tablet Take by mouth.   11/07/2019 at 0555  . pantoprazole (PROTONIX) 40 MG tablet Take 1 tablet by mouth daily.   11/07/2019 at 0555  . polyethylene glycol (MIRALAX / GLYCOLAX) 17 g packet Take 17 g by mouth daily as needed. (Patient not taking: Reported on 11/07/2019)   Completed Course at Unknown time  . potassium chloride SA (KLOR-CON) 20 MEQ tablet Take 1 tablet (20 mEq total) by mouth daily for 3 days. 3 tablet 0      Allergies  Allergen Reactions  . Sucralfate Other (See Comments)    dizziness     Past Medical History:  Diagnosis Date  . Cancer ([place])   . Chronic kidney disease   . Dysphagia   . GERD (gastroesophageal reflux disease)   . History of BPH   . Hyperlipidemia   . Hypertension   . Myocardial infarction ([place])   . Peripheral vascular disease ([place])     Review of systems:  Otherwise negative.    Physical  Exam  Gen: Alert, oriented. Appears stated age.  HEENT: [place]/AT. PERRLA. Lungs: CTA, no wheezes. CV: RR nl S1, S2. Abd: soft, benign, no masses. BS+ Ext: No edema. Pulses 2+    Planned procedures: Proceed with colonoscopy. The patient understands the nature of the planned procedure, indications, risks, alternatives and potential complications including but not limited to bleeding, infection, perforation, damage to internal organs and possible oversedation/side effects from anesthesia. The patient agrees and gives consent to proceed.  Please refer to procedure notes for findings, recommendations and patient disposition/instructions.     Ermagene Saidi K. Alice Reichert, M.D. Gastroenterology 11/07/2019  2:31 PM

## 2019-11-07 NOTE — Op Note (Signed)
[facility] Gastroenterology Patient Name: Ruben Diaz Procedure Date: 11/07/2019 2:28 PM MRN: [id number] Account #: [id number] Date of Birth: 26-Aug-1945 Admit Type: Inpatient Age: 74 Room: [facility] ENDO ROOM 3 Gender: Male Note Status: Finalized Procedure:             Colonoscopy Indications:           High risk colon cancer surveillance: Personal history                         of colonic polyps Providers:             Benay Pike. Alice Reichert MD, MD Referring MD:          Baxter Hire, MD (Referring MD) Medicines:             Propofol per Anesthesia Complications:         No immediate complications. Procedure:             Pre-Anesthesia Assessment:                        - The risks and benefits of the procedure and the                         sedation options and risks were discussed with the                         patient. All questions were answered and informed                         consent was obtained.                        - Patient identification and proposed procedure were                         verified prior to the procedure by the nurse. The                         procedure was verified in the procedure room.                        - ASA Grade Assessment: II - A patient with mild                         systemic disease.                        - After reviewing the risks and benefits, the patient                         was deemed in satisfactory condition to undergo the                         procedure.                        After obtaining informed consent, the colonoscope was                         passed under direct vision. Throughout the  procedure,                         the patient's blood pressure, pulse, and oxygen                         saturations were monitored continuously. The                         Colonoscope was introduced through the anus and                         advanced to the the cecum, identified by appendiceal                          orifice and ileocecal valve. The colonoscopy was                         performed without difficulty. The patient tolerated                         the procedure well. The quality of the bowel                         preparation was adequate. The ileocecal valve,                         appendiceal orifice, and rectum were photographed. Findings:      The perianal and digital rectal examinations were normal. Pertinent       negatives include normal sphincter tone and no palpable rectal lesions.      A few small-mouthed diverticula were found in the sigmoid colon.      Non-bleeding internal hemorrhoids were found during retroflexion. The       hemorrhoids were Grade I (internal hemorrhoids that do not prolapse).      A 4 mm polyp was found in the transverse colon. The polyp was sessile.       The polyp was removed with a cold biopsy forceps. Resection and       retrieval were complete.      The exam was otherwise without abnormality. Impression:            - Diverticulosis in the sigmoid colon.                        - Non-bleeding internal hemorrhoids.                        - One 4 mm polyp in the transverse colon, removed with                         a cold biopsy forceps. Resected and retrieved.                        - The examination was otherwise normal. Recommendation:        - Patient has a contact number available for                         emergencies. The signs and symptoms of potential  delayed complications were discussed with the patient.                         Return to normal activities tomorrow. Written                         discharge instructions were provided to the patient.                        - Resume previous diet.                        - Continue present medications.                        - Await pathology results.                        - No repeat colonoscopy due to current age (81 years                          or older).                        - Return to GI office PRN.                        - If polyps are benign or adenomatous without                         dysplasia, I will advise NO further colonoscopy due to                         advanced age and/or severe comorbidity.                        - The findings and recommendations were discussed with                         the patient. Procedure Code(s):     --- Professional ---                        [phone], Colonoscopy, flexible; with biopsy, single or                         multiple Diagnosis Code(s):     --- Professional ---                        K57.30, Diverticulosis of large intestine without                         perforation or abscess without bleeding                        K63.5, Polyp of colon                        K64.0, First degree hemorrhoids                        Z86.010, Personal history of  colonic polyps CPT copyright 2019 [employer]. All rights reserved. The codes documented in this report are preliminary and upon coder review may  be revised to meet current compliance requirements. Efrain Sella MD, MD 11/07/2019 3:09:31 PM This report has been signed electronically. Number of Addenda: 0 Note Initiated On: 11/07/2019 2:28 PM Scope Withdrawal Time: 0 hours 7 minutes 31 seconds  Total Procedure Duration: 0 hours 11 minutes 23 seconds  Estimated Blood Loss:  Estimated blood loss: none.      [facility]

## 2019-11-07 NOTE — Transfer of Care (Signed)
Immediate Anesthesia Transfer of Care Note  Patient: Ruben Diaz  Procedure(s) Performed: COLONOSCOPY WITH PROPOFOL (N/A )  Patient Location: Endoscopy Unit  Anesthesia Type:General  Level of Consciousness: drowsy and patient cooperative  Airway & Oxygen Therapy: Patient Spontanous Breathing  Post-op Assessment: Report given to RN and Post -op Vital signs reviewed and stable  Post vital signs: Reviewed and stable  Last Vitals:  Vitals Value Taken Time  BP 103/68 11/07/19 1507  Temp 36.2 C 11/07/19 1505  Pulse 66 11/07/19 1505  Resp 16 11/07/19 1507  SpO2 100 % 11/07/19 1507    Last Pain:  Vitals:   11/07/19 1505  TempSrc: Temporal  PainSc: Asleep         Complications: No complications documented.

## 2019-11-08 ENCOUNTER — Encounter: Payer: Self-pay | Admitting: Internal Medicine

## 2019-11-08 NOTE — Anesthesia Postprocedure Evaluation (Signed)
Anesthesia Post Note  Patient: Ruben Diaz  Procedure(s) Performed: COLONOSCOPY WITH PROPOFOL (N/A )  Patient location during evaluation: Endoscopy Anesthesia Type: General Level of consciousness: awake and alert Pain management: pain level controlled Vital Signs Assessment: post-procedure vital signs reviewed and stable Respiratory status: spontaneous breathing, nonlabored ventilation, respiratory function stable and patient connected to nasal cannula oxygen Cardiovascular status: blood pressure returned to baseline and stable Postop Assessment: no apparent nausea or vomiting Anesthetic complications: no   No complications documented.   Last Vitals:  Vitals:   11/07/19 1515 11/07/19 1525  BP: 102/61 101/61  Pulse: 67 62  Resp: 16 16  Temp:    SpO2: 98% 98%    Last Pain:  Vitals:   11/08/19 0756  TempSrc:   PainSc: 0-No pain                 Precious Haws [name]

## 2019-11-09 LAB — SURGICAL PATHOLOGY

## 2019-11-19 IMAGING — CT CT BIOPSY
2 of 3 series · 7 of 14 positions shown, 8 images · non-contrast
Comparison: none

CLINICAL DATA: Anterior right upper lobe lung mass requiring image
guided biopsy.

[Series 2: i-spiral 5.0 b30f · axial · 0.58mm/px · z∈[+936,+966]mm · 2 of 20 slices shown, 3 images]
[im 7/20  soft-tissue]
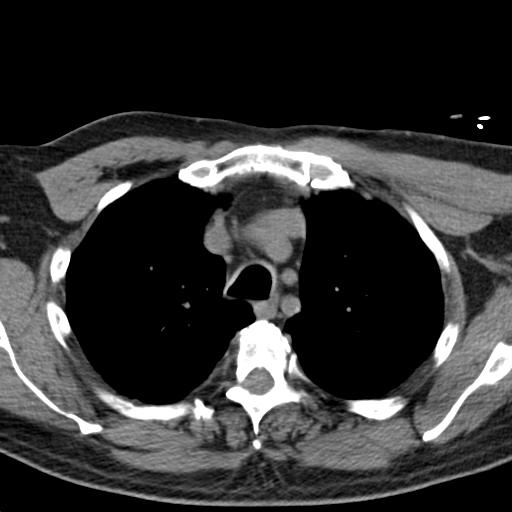
[im 7/20  bone]
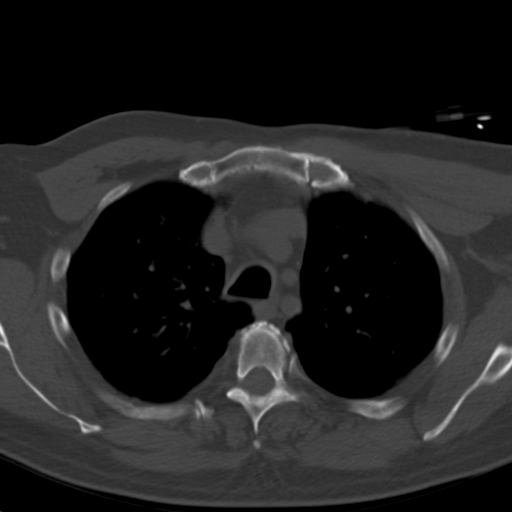
[im 13/20  bone]
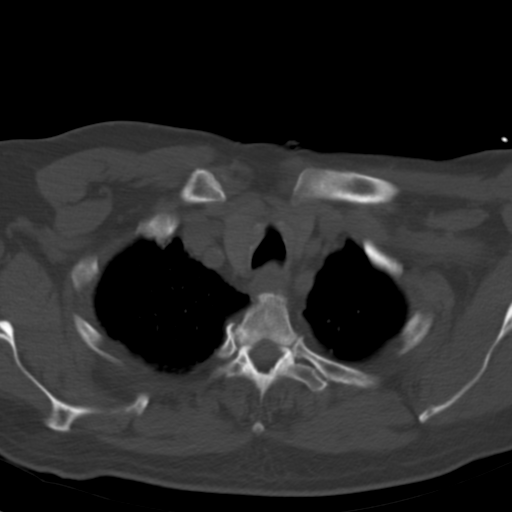

[Series 3: i-sequence 4.8 b30s · axial · 0.58mm/px · z∈[+951,+961]mm · 5 of 39 slices shown]
[im 7/39  bone]
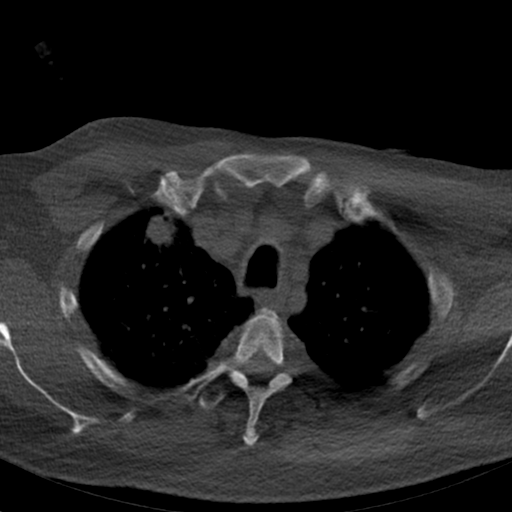
[im 13/39  bone]
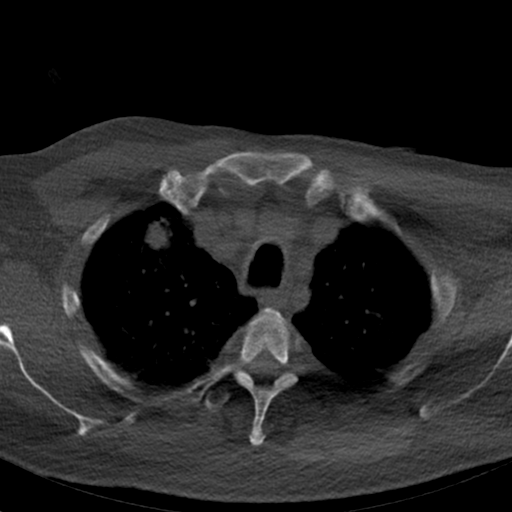
[im 20/39  bone]
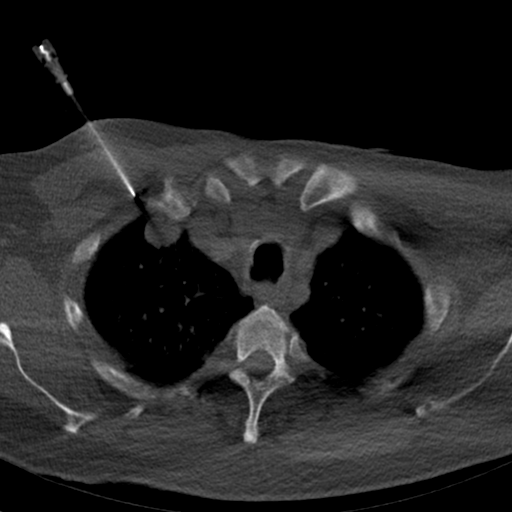
[im 26/39  bone]
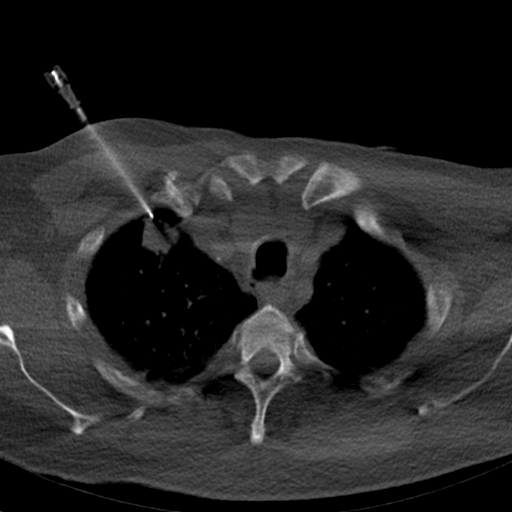
[im 32/39  bone]
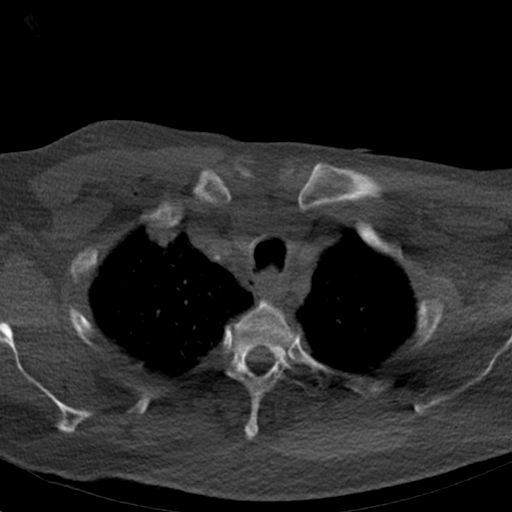

[7 of 14 positions shown; findings below may reference images not displayed]

EXAM:
CT GUIDED CORE BIOPSY OF LUNG MASS

ANESTHESIA/SEDATION:
2.5 mg IV Versed; 75 mcg IV Fentanyl

Total Moderate Sedation Time:  24 minutes.

The patient's level of consciousness and physiologic status were
continuously monitored during the procedure by Radiology nursing.

PROCEDURE:
The procedure risks, benefits, and alternatives were explained to
the patient. Questions regarding the procedure were encouraged and
answered. The patient understands and consents to the procedure.

The right anterior chest wall was prepped with chlorhexidine in a
sterile fashion, and a sterile drape was applied covering the
operative field. A sterile gown and sterile gloves were used for the
procedure. Local anesthesia was provided with 1% Lidocaine.

CT was performed in a supine position to localize a right upper lobe
lung mass. Under CT guidance, a 17 gauge trocar needle was advanced
to the anterior margin of the mass. After confirming needle tip
position, 2 separate 18 gauge core biopsy samples were obtained with
an automated biopsy device. Core biopsy samples were submitted in
formalin. The outer needle was removed as the Biosentry device was
utilized in depositing a plug at the pleural entry site. Additional
CT images were performed.

COMPLICATIONS:
Tiny right anterior pneumothorax. SIR level A: No therapy, no
consequence.
FINDINGS: Subpleural anterior right upper lobe lung mass immediately deep to
the distal aspect of the first rib and juncture of the first rib
with the manubrium measures approximately 1.8 x 2.1 cm. Solid core
biopsy samples were obtained. After the second core biopsy sample, a
tiny anterior pneumothorax was present anterior to the nodule. The
patient was asymptomatic. This small pneumothorax will be followed
by a chest x-ray during recovery.
IMPRESSION: CT-guided core biopsy of anterior right upper lobe lung mass
measuring approximately 2.1 cm in greatest diameter by CT today. The
procedure was complicated by a tiny anterior pneumothorax adjacent
to the lung nodule. This will be followed by chest x-ray during
recovery.

## 2019-12-07 DIAGNOSIS — J432 Centrilobular emphysema: Secondary | ICD-10-CM | POA: Insufficient documentation

## 2020-02-13 ENCOUNTER — Telehealth: Payer: Self-pay | Admitting: Internal Medicine

## 2020-02-14 ENCOUNTER — Ambulatory Visit
Admission: RE | Admit: 2020-02-14 | Discharge: 2020-02-14 | Disposition: A | Discharge status: Home / Self Care | Payer: Medicare Other | Source: Ambulatory Visit | Attending: Hematology and Oncology | Admitting: Hematology and Oncology

## 2020-02-14 ENCOUNTER — Other Ambulatory Visit: Payer: Self-pay

## 2020-02-14 DIAGNOSIS — C3411 Malignant neoplasm of upper lobe, right bronchus or lung: Secondary | ICD-10-CM | POA: Diagnosis not present

## 2020-02-14 NOTE — Progress Notes (Signed)
[facility]  [address] [place], [place] [address] Phone: [phone]  Fax: [phone]   Clinic Day:  02/18/2020  Referring physician: Baxter Hire, MD  Chief Complaint: [name] is a 74 y.o. 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   HPI: The patient was last seen in the medical oncology clinic on 08/16/2019. At that time, he felt all right.  His cough and shortness of breath were the same. Hematocrit was 40.4, hemoglobin 13.0, platelets 180,000, WBC 3,300.  Peripheral smear showed absolute leukocytopenia with unremarkable morphology. There were normocytic erythrocytes with unremarkable RBC indices. Platelet count and morphology were normal.  Potassium was 2.9. We diiscussed plans for ongoing surveillance.  CBC on 08/30/2019 revealed a hematocrit of 39.8, hemoglobin 12.8, platelets 211,000, WBC 4,200.  Colonoscopy on 11/07/2019 by Dr. Alice Reichert revealed diverticulosis in the sigmoid colon. There were non-bleeding internal hemorrhoids. There was one 4 mm polyp in the transverse colon (tubular adenoma).  Chest CT without contrast on 02/14/2020 revealed no evidence of lung cancer recurrence.  There was stable nodular pleural thickening in the RIGHT upper lobe at treatment site.  There was stable small subpleural nodule in the LEFT lower lobe.  There was coronary artery calcification and aortic atherosclerosis.  During the interim, he has been good. His cough has improved and is non-productive. His shortness of breath is stable. He is eating well and denies nausea, vomiting, and diarrhea. He still has numbness in his left thumb.  He denies using any new medications or herbal products.   Past Medical History:  Diagnosis Date  . Cancer ([place])   . Chronic kidney disease   . Dysphagia   . GERD (gastroesophageal reflux disease)   . History of BPH   . Hyperlipidemia   . Hypertension    . Myocardial infarction ([place])   . Peripheral vascular disease [facility])     Past Surgical History:  Procedure Laterality Date  . APPENDECTOMY    . COLONOSCOPY WITH PROPOFOL N/A 09/16/2016   Procedure: COLONOSCOPY WITH PROPOFOL;  Surgeon: [name], MD;  Location: [facility] ENDOSCOPY;  Service: Endoscopy;  Laterality: N/A;  . COLONOSCOPY WITH PROPOFOL N/A 11/07/2019   Procedure: COLONOSCOPY WITH PROPOFOL;  Surgeon: Toledo, Benay Pike, MD;  Location: [facility] ENDOSCOPY;  Service: Gastroenterology;  Laterality: N/A;  . ESOPHAGOGASTRODUODENOSCOPY (EGD) WITH PROPOFOL N/A 09/16/2016   Procedure: ESOPHAGOGASTRODUODENOSCOPY (EGD) WITH PROPOFOL;  Surgeon: [name], MD;  Location: [facility] ENDOSCOPY;  Service: Endoscopy;  Laterality: N/A;  . ESOPHAGOGASTRODUODENOSCOPY (EGD) WITH PROPOFOL N/A 12/16/2016   Procedure: ESOPHAGOGASTRODUODENOSCOPY (EGD) WITH PROPOFOL;  Surgeon: [name], MD;  Location: [facility] ENDOSCOPY;  Service: Endoscopy;  Laterality: N/A;  . STOMACH SURGERY      History reviewed. No pertinent family history.  Social History:  reports that he quit smoking about 8 years ago. He has a 35.70 pack-year smoking history. He has never used smokeless tobacco. He reports that he does not drink alcohol and does not use drugs. He is retired. He worked in a [employer]. He denies any exposure to radiation or toxins. He lives in [place].  The patient is alone today.  Allergies:  Allergies  Allergen Reactions  . Sucralfate Other (See Comments)    dizziness    Current Medications: Current Outpatient Medications  Medication Sig Dispense Refill  . amLODipine (NORVASC) 10 MG tablet TAKE 1 TABLET BY MOUTH ONCE DAILY 90 tablet 0  .  aspirin EC 81 MG tablet Take 81 mg by mouth daily.    Marland Kitchen atorvastatin (LIPITOR) 10 MG tablet Take 10 mg by mouth daily.    . hydrochlorothiazide (HYDRODIURIL) 25 MG tablet Take 1 tablet (25 mg total) by mouth daily. 30 tablet 3  . magnesium oxide (MAG-OX) 400  MG tablet Take by mouth.    . pantoprazole (PROTONIX) 40 MG tablet Take 1 tablet by mouth daily.    . polyethylene glycol (MIRALAX / GLYCOLAX) 17 g packet Take 17 g by mouth daily as needed.     . potassium chloride SA (KLOR-CON) 20 MEQ tablet Take 1 tablet (20 mEq total) by mouth daily for 3 days. 3 tablet 0   No current facility-administered medications for this visit.    Review of Systems  Constitutional: Negative.  Negative for chills, diaphoresis, fever, malaise/fatigue and weight loss (up 3 lbs).       Feels "ok".  HENT: Positive for hearing loss. Negative for congestion, ear discharge, ear pain, nosebleeds, sinus pain, sore throat and tinnitus.   Eyes: Negative.  Negative for blurred vision, double vision and photophobia.  Respiratory: Positive for cough (chronic, improved) and shortness of breath (chronic, stable). Negative for hemoptysis and sputum production.   Cardiovascular: Negative.  Negative for chest pain, palpitations, orthopnea, leg swelling and PND.  Gastrointestinal: Negative for abdominal pain, blood in stool, constipation, diarrhea, heartburn, melena, nausea and vomiting.  Genitourinary: Negative.  Negative for dysuria, hematuria and urgency.  Musculoskeletal: Negative.  Negative for back pain, joint pain and myalgias.  Skin: Negative.  Negative for itching and rash.  Neurological: Positive for sensory change (numbness in left thumb). Negative for dizziness, tingling, speech change, focal weakness, weakness and headaches.  Endo/Heme/Allergies: Negative.  Does not bruise/bleed easily.  Psychiatric/Behavioral: Negative.  Negative for depression and memory loss. The patient is not nervous/anxious and does not have insomnia.   All other systems reviewed and are negative.  Performance status (ECOG): 1  Vitals Blood pressure 124/63, pulse 62, temperature 98 F (36.7 C), temperature source Tympanic, weight 202 lb 6.1 oz (91.8 kg), SpO2 99 %.   Physical Exam Vitals and  nursing note reviewed.  Constitutional:      General: He is not in acute distress.    Appearance: He is well-developed. He is not diaphoretic.  HENT:     Head: Normocephalic and atraumatic.     Comments: Cap.  Gray beard.    Mouth/Throat:     Pharynx: No oropharyngeal exudate.  Eyes:     General: No scleral icterus.    Conjunctiva/sclera: Conjunctivae normal.     Pupils: Pupils are equal, round, and reactive to light.     Comments: Brown eyes.  Cardiovascular:     Rate and Rhythm: Normal rate and regular rhythm.     Heart sounds: Normal heart sounds. No murmur heard.  No gallop.   Pulmonary:     Effort: Pulmonary effort is normal. No respiratory distress.     Breath sounds: Normal breath sounds. No wheezing or rales.  Abdominal:     General: Bowel sounds are normal. There is no distension.     Palpations: Abdomen is soft. There is no hepatomegaly, splenomegaly or mass.     Tenderness: There is no abdominal tenderness. There is no guarding or rebound.  Musculoskeletal:        General: No tenderness. Normal range of motion.     Cervical back: Normal range of motion and neck supple.  Lymphadenopathy:  Head:     Right side of head: No preauricular, posterior auricular or occipital adenopathy.     Left side of head: No preauricular or posterior auricular adenopathy.     Cervical: No cervical adenopathy.     Upper Body:     Right upper body: No supraclavicular or axillary adenopathy.     Left upper body: No supraclavicular or axillary adenopathy.     Lower Body: No right inguinal adenopathy. No left inguinal adenopathy.  Skin:    General: Skin is warm and dry.     Coloration: Skin is not pale.     Findings: No erythema or rash.  Neurological:     Mental Status: He is alert and oriented to person, place, and time.  Psychiatric:        Behavior: Behavior normal.        Thought Content: Thought content normal.        Judgment: Judgment normal.     Imaging  studies: 04/18/2017: Low dose chest CT revealed several pulmonary nodules in the lungs, however, therewere2 new nodules in the anterior aspect of the right upper lobe. One nodule wasa12.9 mmnon solid lesion with multiple internal cystic areas. The most concerning lesionwasa solid 16.9 mmlesion in the anterior aspect of the right upper lobe near the apex just above the other previously described lesion. 05/04/2017: PET scanon 05/04/2017 revealed a markedly hypermetabolic 19 mm right upper lobe pulmonary lesionc/wprimary lung neoplasm.There were no enlarged or hypermetabolic mediastinal/hilar lymph nodes.There was a single (poorly visualized on CT) hypermetabolic hepatic lesion near the gallbladder fossa. 05/12/2017: Liver MRI revealed no evidence of hepatic or other abdominal metastatic disease. 08/12/2017: Chest CTrevealed decrease in size of right upper lobe solid nodule(1.8 x 2.1 cm to 1.2 x 1.7 cm).There was interval decrease in size of adjacent right upper lobe sub solid nodule(1.3 x 0.9 cm to 1.0 x 0.7 cm).There was interval development of multiple tiny pulmonary nodules within the peripheral right lower lobe. Findingswerelikely infectious/inflammatory in etiology. 02/13/2018: Chest CT revealed stable subpleural right upper lobe nodule.There was adjacent peribronchovascular soft tissue thickening without a discretenodule. There was interval clearing of peribronchovascular nodularity previously seen in the right lower lobe. 08/14/2018:  Chest CT with contrast revealed stable post treatment related changes of the evolving radiation fibrosis in the anterior aspect of the right upper lobe. There were no findings to suggest local recurrence of disease or definite metastatic disease in the thorax. There was mild diffuse bronchial wall thickening with mild centrilobular and paraseptal emphysema suggestive of underlying COPD. There was aortic atherosclerosis, in addition to left  main and 3 vessel coronary artery disease.  02/14/2019:  Chest CT revealed a stable post treatment changes of the right upper lobe. There was no evidence of recurrent or metastatic disease in the chest. 08/13/2019:  Chest CT revealed stable post treatment related change in the anterior right upper lobe. There was no evidence of recurrent or metastatic disease. There was coronary artery calcifications.   02/14/2020:  Chest CT without contrast revealed no evidence of lung cancer recurrence.  There was stable nodular pleural thickening in the RIGHT upper lobe at treatment site.  There was stable small subpleural nodule in the LEFT lower lobe.  There was coronary artery calcification and aortic atherosclerosis.   No visits with results within 3 Day(s) from this visit.  Latest known visit with results is:  Admission on 11/07/2019, Discharged on 11/07/2019  Component Date Value Ref Range Status  . SURGICAL PATHOLOGY 11/07/2019  Final-Edited                   Value:SURGICAL PATHOLOGY CASE: [phone] PATIENT: Ruben Diaz Reason Surgical Pathology Report   Specimen Submitted: A. Colon polyp, transverse; cbx  Clinical History: P HX polyps.  Colon polyp, diverticulosis, internal hemorrhoids  DIAGNOSIS: A. COLON POLYP, TRANSVERSE; COLD BIOPSY: - TUBULAR ADENOMA. - NEGATIVE FOR HIGH-GRADE DYSPLASIA AND MALIGNANCY.  GROSS DESCRIPTION: A. Labeled: cbx transverse colon polyp Received: Formalin Tissue fragment(s): Multiple Size: Aggregate, 0.6 x 0.5 x 0.2 cm Description: Tan soft tissue fragments Entirely submitted in 1 cassette.  Final Diagnosis performed by Quay Burow, MD.   Electronically signed 11/09/2019 10:11:13AM The electronic signature indicates that the named Attending Pathologist has evaluated the specimen Technical component performed at [facility], [address] [place], [place] [address] Lab: [phone] Dir: Rush Farmer, MD, MMM  Professional component performed at  [facility], [place], [place], [place] [address] Lab: [phone] Dir: Dellia Nims. Reuel Derby, MD    Assessment:  Ruben Diaz Diaz is a 74 y.o. male with stage IA RUL lung cancers/p CT guided biopsy on 06/01/2017. Pathologyrevealed adenocarcinoma, acinar and solid pattern.  PET scanon 05/04/2017 revealed a markedly hypermetabolic 19 mm right upper lobe pulmonary lesionc/wprimary lung neoplasm.There were no enlarged or hypermetabolic mediastinal/hilar lymph nodes.There was a single (poorly visualized on CT) hypermetabolic hepatic lesion near the gallbladder fossa.Liver MRIon 05/12/2017 revealed no evidence of metastatic disease.  He declined surgery. He received 4 cycles of carboplatin and Alimta(06/14/2017 - 08/16/2017). He received SBRTfrom 11/01/2017 - 11/13/2017. He received6000 cGy in 5 fractions.  Chest CT on 02/14/2020 revealed no evidence of lung cancer recurrence.    Colonoscopy on 08/11/2021revealed diverticulosis in the sigmoid colon. There were non-bleeding internal hemorrhoids. There was one 4 mm polyp in the transverse colon (tubular adenoma).  He has intermittent leukopenia.  He denies any new medications or herbal products.  Patient tested positive for COVID-19 on 03/29/2019.  The patient has received the COVID-19 vaccine.   Symptomatically, he felt "good". His cough has improved and is non-productive. His shortness of breath is stable. He is eating well and denies nausea, vomiting, and diarrhea. He still has numbness in his left thumb.  Exam is stable.  Plan: 1.   Labs today: CBC with diff, CMP, B12, folate, TSH. 2.   Peripheral smear for pathologic review. 3.Stage IA RUL adenocarcinoma of the lung Clinically, he is doing well.  Exam is unremarkable.  Review chest CT from 02/14/2020.  Images personally reviewed.  Agree with radiology findings.   No evidence of recurrent  disease.  Gust plan for follow-up imaging every 6 months until 5 years s/p completion of therapy then low dose annual chest CT.. 4.   Leukopenia  WBC 3300 and ANC 1200 on 08/16/2019.  WBC 3800 and ANC 1700 on 02/18/2020.    Etiology remains unclear.  He denies any new medications or herbal products.  Work-up today. 5.   Schedule chest CT on 08/13/2020. 6.   RTC after chest CT for MD assessment, labs (CBC with diff, CMP, +/- others) and review of chest CT.  Addendum: B12 was 222 (low) and folate 19.3.  TSH was 3.269.  The patient will be contacted regarding his low B12 and initiation of daily oral B12.  B12 goal is 400.  CBC and B12 will be  checked in 1 month.  I discussed the assessment and treatment plan with the patient.  The patient was provided an opportunity to ask questions and all were answered.  The patient agreed with the plan and demonstrated an understanding of the instructions.  The patient was advised to call back if the symptoms worsen or if the condition fails to improve as anticipated.   Lequita Asal, MD, PhD    02/18/2020, 9:00 AM  I, Mirian Mo Tufford, am acting as Education administrator for [employer]. Mike Gip, MD, PhD.  I, Teria Khachatryan C. Mike Gip, MD, have reviewed the above documentation for accuracy and completeness, and I agree with the above.

## 2020-02-18 ENCOUNTER — Encounter: Payer: Self-pay | Admitting: Hematology and Oncology

## 2020-02-18 ENCOUNTER — Inpatient Hospital Stay (HOSPITAL_BASED_OUTPATIENT_CLINIC_OR_DEPARTMENT_OTHER): Payer: Medicare Other | Admitting: Hematology and Oncology

## 2020-02-18 ENCOUNTER — Inpatient Hospital Stay: Payer: Medicare Other | Attending: Hematology and Oncology

## 2020-02-18 ENCOUNTER — Other Ambulatory Visit: Payer: Self-pay

## 2020-02-18 ENCOUNTER — Telehealth: Payer: Self-pay

## 2020-02-18 VITALS — BP 124/63 | HR 62 | Temp 98.0°F | Wt 202.4 lb

## 2020-02-18 DIAGNOSIS — Z8616 Personal history of COVID-19: Secondary | ICD-10-CM | POA: Diagnosis not present

## 2020-02-18 DIAGNOSIS — I129 Hypertensive chronic kidney disease with stage 1 through stage 4 chronic kidney disease, or unspecified chronic kidney disease: Secondary | ICD-10-CM | POA: Diagnosis not present

## 2020-02-18 DIAGNOSIS — E785 Hyperlipidemia, unspecified: Secondary | ICD-10-CM | POA: Insufficient documentation

## 2020-02-18 DIAGNOSIS — N189 Chronic kidney disease, unspecified: Secondary | ICD-10-CM | POA: Diagnosis not present

## 2020-02-18 DIAGNOSIS — D72819 Decreased white blood cell count, unspecified: Secondary | ICD-10-CM | POA: Diagnosis not present

## 2020-02-18 DIAGNOSIS — I252 Old myocardial infarction: Secondary | ICD-10-CM | POA: Diagnosis not present

## 2020-02-18 DIAGNOSIS — K219 Gastro-esophageal reflux disease without esophagitis: Secondary | ICD-10-CM | POA: Insufficient documentation

## 2020-02-18 DIAGNOSIS — Z79899 Other long term (current) drug therapy: Secondary | ICD-10-CM | POA: Diagnosis not present

## 2020-02-18 DIAGNOSIS — I2510 Atherosclerotic heart disease of native coronary artery without angina pectoris: Secondary | ICD-10-CM | POA: Diagnosis not present

## 2020-02-18 DIAGNOSIS — C3411 Malignant neoplasm of upper lobe, right bronchus or lung: Secondary | ICD-10-CM | POA: Diagnosis present

## 2020-02-18 DIAGNOSIS — N400 Enlarged prostate without lower urinary tract symptoms: Secondary | ICD-10-CM | POA: Insufficient documentation

## 2020-02-18 DIAGNOSIS — R0602 Shortness of breath: Secondary | ICD-10-CM | POA: Diagnosis not present

## 2020-02-18 DIAGNOSIS — I739 Peripheral vascular disease, unspecified: Secondary | ICD-10-CM | POA: Diagnosis not present

## 2020-02-18 DIAGNOSIS — Z7982 Long term (current) use of aspirin: Secondary | ICD-10-CM | POA: Diagnosis not present

## 2020-02-18 DIAGNOSIS — R200 Anesthesia of skin: Secondary | ICD-10-CM | POA: Insufficient documentation

## 2020-02-18 LAB — COMPREHENSIVE METABOLIC PANEL
ALT: 12 U/L (ref 0–44)
AST: 21 U/L (ref 15–41)
Albumin: 3.7 g/dL (ref 3.5–5.0)
Alkaline Phosphatase: 51 U/L (ref 38–126)
Anion gap: 9 (ref 5–15)
BUN: 30 mg/dL — ABNORMAL HIGH (ref 8–23)
CO2: 28 mmol/L (ref 22–32)
Calcium: 9.1 mg/dL (ref 8.9–10.3)
Chloride: 98 mmol/L (ref 98–111)
Creatinine, Ser: 1.28 mg/dL — ABNORMAL HIGH (ref 0.61–1.24)
GFR, Estimated: 58 mL/min — ABNORMAL LOW (ref >60)
Glucose, Bld: 104 mg/dL — ABNORMAL HIGH (ref 70–99)
Potassium: 3.8 mmol/L (ref 3.5–5.1)
Sodium: 135 mmol/L (ref 135–145)
Total Bilirubin: 0.4 mg/dL (ref 0.3–1.2)
Total Protein: 7.7 g/dL (ref 6.5–8.1)

## 2020-02-18 LAB — CBC WITH DIFFERENTIAL/PLATELET
Abs Immature Granulocytes: 0.01 10*3/uL (ref 0.00–0.07)
Basophils Absolute: 0.1 10*3/uL (ref 0.0–0.1)
Basophils Relative: 2 %
Eosinophils Absolute: 0.2 10*3/uL (ref 0.0–0.5)
Eosinophils Relative: 4 %
HCT: 42.4 % (ref 39.0–52.0)
Hemoglobin: 14 g/dL (ref 13.0–17.0)
Immature Granulocytes: 0 %
Lymphocytes Relative: 36 %
Lymphs Abs: 1.4 10*3/uL (ref 0.7–4.0)
MCH: 28 pg (ref 26.0–34.0)
MCHC: 33 g/dL (ref 30.0–36.0)
MCV: 84.8 fL (ref 80.0–100.0)
Monocytes Absolute: 0.6 10*3/uL (ref 0.1–1.0)
Monocytes Relative: 14 %
Neutro Abs: 1.7 10*3/uL (ref 1.7–7.7)
Neutrophils Relative %: 44 %
Platelets: 207 10*3/uL (ref 150–400)
RBC: 5 MIL/uL (ref 4.22–5.81)
RDW: 14.2 % (ref 11.5–15.5)
WBC: 3.8 10*3/uL — ABNORMAL LOW (ref 4.0–10.5)
nRBC: 0 % (ref 0.0–0.2)

## 2020-02-18 LAB — FOLATE: Folate: 19.3 ng/mL (ref >5.9)

## 2020-02-18 LAB — VITAMIN B12: Vitamin B-12: 222 pg/mL (ref 180–914)

## 2020-02-18 LAB — TSH: TSH: 3.269 u[IU]/mL (ref 0.350–4.500)

## 2020-02-18 LAB — PATHOLOGIST SMEAR REVIEW

## 2020-02-18 NOTE — Progress Notes (Signed)
Patient here for oncology follow-up appointment, expresses concerns of chronic shortness of breath and difficulty swallowing.

## 2020-02-18 NOTE — Progress Notes (Deleted)
02/19/2020 12:20 PM   Ruben Diaz 1945-09-02 [id number]  Referring provider: Baxter Hire, MD [place],  [place] [address]  No chief complaint on file.   HPI: Ruben Diaz is a 74 y.o. male with microscopic hematuria and BPH with LU TS who presents today for a three month follow up.  High risk hematuria Former smoker.  CTU 06/15/2019 Normal adrenals. No right renal stones. Nonobstructing 2 mm interpolar left renal stone. No hydronephrosis. Normal caliber ureters. No ureteral stones. Subcentimeter hypodense renal cortical lesions scattered in the left kidney, too small to characterize, requiring no follow-up. No suspicious renal cortical masses. On delayed imaging, there is no urothelial wall thickening and there are no filling defects in the opacified portions of the bilateral collecting systems or ureters. No bladder stones, masses or diverticula. Mild diffuse bladder wall thickening and trabeculation.  Mild prostatomegaly with nonspecific internal prostatic calcifications.  Cysto 07/18/2019 with Dr. Erlene Quan enlarged prostate w/ trilobar coaptation and intravesical protrusion into bladder  Moderately trabeculated bladder w/ saccules.  ***  BPH WITH LUTS  (prostate and/or bladder) I PSS: ***     PVR: ***     Previous IPSS score: 7/1   Previous PVR: 56 mL     Major complaint(s):  *** Patient denies any modifying or aggravating factors.  Patient denies any gross hematuria, dysuria or suprapubic/flank pain.  Patient denies any fevers, chills, nausea or vomiting.     Score:  1-7 Mild 8-19 Moderate 20-35 Severe  PMH: Past Medical History:  Diagnosis Date  . Cancer ([place])   . Chronic kidney disease   . Dysphagia   . GERD (gastroesophageal reflux disease)   . History of BPH   . Hyperlipidemia   . Hypertension   . Myocardial infarction ([place])   . Peripheral vascular disease [facility])     Surgical History: Past Surgical History:   Procedure Laterality Date  . APPENDECTOMY    . COLONOSCOPY WITH PROPOFOL N/A 09/16/2016   Procedure: COLONOSCOPY WITH PROPOFOL;  Surgeon: [name], MD;  Location: [facility] ENDOSCOPY;  Service: Endoscopy;  Laterality: N/A;  . COLONOSCOPY WITH PROPOFOL N/A 11/07/2019   Procedure: COLONOSCOPY WITH PROPOFOL;  Surgeon: Toledo, Benay Pike, MD;  Location: [facility] ENDOSCOPY;  Service: Gastroenterology;  Laterality: N/A;  . ESOPHAGOGASTRODUODENOSCOPY (EGD) WITH PROPOFOL N/A 09/16/2016   Procedure: ESOPHAGOGASTRODUODENOSCOPY (EGD) WITH PROPOFOL;  Surgeon: [name], MD;  Location[facility] ENDOSCOPY;  Service: Endoscopy;  Laterality: N/A;  . ESOPHAGOGASTRODUODENOSCOPY (EGD) WITH PROPOFOL N/A 12/16/2016   Procedure: ESOPHAGOGASTRODUODENOSCOPY (EGD) WITH PROPOFOL;  Surgeon: [name], MD;  Location: [facility] ENDOSCOPY;  Service: Endoscopy;  Laterality: N/A;  . STOMACH SURGERY      Home Medications:  Allergies as of 02/19/2020      Reactions   Sucralfate Other (See Comments)   dizziness      Medication List       Accurate as of February 18, 2020 12:20 PM. If you have any questions, ask your nurse or doctor.        amLODipine 10 MG tablet Commonly known as: NORVASC TAKE 1 TABLET BY MOUTH ONCE DAILY   aspirin EC 81 MG tablet Take 81 mg by mouth daily.   atorvastatin 10 MG tablet Commonly known as: LIPITOR Take 10 mg by mouth daily.   hydrochlorothiazide 25 MG tablet Commonly known as: HYDRODIURIL Take 1 tablet (25 mg total) by mouth daily.   magnesium oxide 400 MG tablet Commonly known as: MAG-OX Take  by mouth.   pantoprazole 40 MG tablet Commonly known as: PROTONIX Take 1 tablet by mouth daily.   polyethylene glycol 17 g packet Commonly known as: MIRALAX / GLYCOLAX Take 17 g by mouth daily as needed.   potassium chloride SA 20 MEQ tablet Commonly known as: KLOR-CON Take 1 tablet (20 mEq total) by mouth daily for 3 days.       Allergies:  Allergies  Allergen  Reactions  . Sucralfate Other (See Comments)    dizziness    Family History: No family history on file.  Social History:  reports that he quit smoking about 8 years ago. He has a 35.70 pack-year smoking history. He has never used smokeless tobacco. He reports that he does not drink alcohol and does not use drugs.  ROS: Pertinent ROS in HPI  Physical Exam: There were no vitals taken for this visit.  Constitutional:  Well nourished. Alert and oriented, No acute distress. HEENT: [place] AT, moist mucus membranes.  Trachea midline Cardiovascular: No clubbing, cyanosis, or edema. Respiratory: Normal respiratory effort, no increased work of breathing. GI: Abdomen is soft, non tender, non distended, no abdominal masses. Liver and spleen not palpable.  No hernias appreciated.  Stool sample for occult testing is not indicated.   GU: No CVA tenderness.  No bladder fullness or masses.  Patient with circumcised/uncircumcised phallus. ***Foreskin easily retracted***  Urethral meatus is patent.  No penile discharge. No penile lesions or rashes. Scrotum without lesions, cysts, rashes and/or edema.  Testicles are located scrotally bilaterally. No masses are appreciated in the testicles. Left and right epididymis are normal. Rectal: Patient with  normal sphincter tone. Anus and perineum without scarring or rashes. No rectal masses are appreciated. Prostate is approximately *** grams, *** nodules are appreciated. Seminal vesicles are normal. Skin: No rashes, bruises or suspicious lesions. Lymph: No inguinal adenopathy. Neurologic: Grossly intact, no focal deficits, moving all 4 extremities. Psychiatric: Normal mood and affect.   Laboratory Data: Lab Results  Component Value Date   WBC 3.8 (L) 02/18/2020   HGB 14.0 02/18/2020   HCT 42.4 02/18/2020   MCV 84.8 02/18/2020   PLT 207 02/18/2020    Lab Results  Component Value Date   CREATININE 1.28 (H) 02/18/2020    Lab Results  Component Value Date    PSA 1.9 12/15/2010    Lab Results  Component Value Date   AST 21 02/18/2020   Lab Results  Component Value Date   ALT 12 02/18/2020    Urinalysis ***  I have reviewed the labs.   Pertinent Imaging: ***  Assessment & Plan:   1. High risk hematuria  Completed workup in 06/2019- findings positive for Nonobstructing 2 mm interpolar left renal stone and enlarged prostate w/ trilobar coaptation and intravesical protrusion into bladder  Moderately trabeculated bladder w/ saccules UA today is clear Patient states he has noted dark-colored urine, I explained that this may be the result of dehydration or blood in the urine.  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 BPH with LUTS IPSS score is 7/1 Continue conservative management, avoiding bladder irritants and timed voiding's Most bothersome symptoms is/are infrequent hesitancy Still not wanting to start medication RTC in 4 months for I PSS, PVR and exam    No follow-ups on file.  These notes generated with voice recognition software. I apologize for typographical errors.  Zara Council, PA-C  [facility] [address] [place], [place] [address] [phone]

## 2020-02-19 ENCOUNTER — Ambulatory Visit: Payer: Self-pay | Admitting: Urology

## 2020-02-19 DIAGNOSIS — N401 Enlarged prostate with lower urinary tract symptoms: Secondary | ICD-10-CM

## 2020-02-19 DIAGNOSIS — R3129 Other microscopic hematuria: Secondary | ICD-10-CM

## 2020-03-04 ENCOUNTER — Other Ambulatory Visit: Payer: Self-pay

## 2020-03-11 ENCOUNTER — Telehealth: Payer: Self-pay | Admitting: Urology

## 2020-03-11 NOTE — Telephone Encounter (Signed)
Would you call Mr. Ruben Diaz and have him reschedule his missed appointment from November for IPSS, PVR, UA and exam?

## 2020-03-17 NOTE — Telephone Encounter (Signed)
LMOM to return call to reschedule missed appointment.

## 2020-03-19 ENCOUNTER — Inpatient Hospital Stay: Payer: Medicare Other | Attending: Hematology and Oncology

## 2020-05-21 DIAGNOSIS — C3490 Malignant neoplasm of unspecified part of unspecified bronchus or lung: Secondary | ICD-10-CM | POA: Insufficient documentation

## 2020-07-02 ENCOUNTER — Telehealth: Payer: Self-pay | Admitting: Urology

## 2020-07-02 NOTE — Telephone Encounter (Signed)
Please call [name] and have him schedule an appointment for IPSS, PVR and UA as it is time for his yearly follow-up.

## 2020-07-03 NOTE — Telephone Encounter (Signed)
Appointment made for yearly follow up with IPSS, PVR and UA.  Patient has been notified.

## 2020-07-23 NOTE — Progress Notes (Deleted)
07/24/2020 9:34 PM   Ruben Diaz [birth year] [id number]  Referring provider: Gracelyn Nurse, MD [address] [place],  [place] [address]  No chief complaint on file.  Urological history: 1. High risk hematuria -former smoker -CTU 05/2019 CTU 06/15/2019 Normal adrenals. No right renal stones. Nonobstructing 2 mm interpolar left renal stone. No hydronephrosis. Normal caliber ureters. No ureteral stones. Subcentimeter hypodense renal cortical lesions scattered in the left kidney, too small to characterize, requiring no follow-up. No suspicious renal cortical masses. On delayed imaging, there is no urothelial wall thickening and there are no filling defects in the opacified portions of the bilateral collecting systems or ureters. No bladder stones, masses or diverticula. Mild diffuse bladder wall thickening and trabeculation.  Mild prostatomegaly with nonspecific internal prostatic calcifications -Cysto 06/2019 enlarged prostate w/ trilobar coaptation and intravesical protrusion into bladder  Moderately trabeculated bladder w/ saccules -UA ***  2. BPH with LU TS -I PSS *** -PVR ***  3. Nephrolithiasis -non-obstructing 2 mm interpolar left renal stone on 05/2019 CTU  HPI: Ruben Diaz is a 75 y.o. male who presents today for follow up.      Score:  1-7 Mild 8-19 Moderate 20-35 Severe  PMH: Past Medical History:  Diagnosis Date  . Cancer ([facility])   . Chronic kidney disease   . Dysphagia   . GERD (gastroesophageal reflux disease)   . History of BPH   . Hyperlipidemia   . Hypertension   . Myocardial infarction ([facility])   . Peripheral vascular disease [facility])     Surgical History: Past Surgical History:  Procedure Laterality Date  . APPENDECTOMY    . COLONOSCOPY WITH PROPOFOL N/A 09/16/2016   Procedure: COLONOSCOPY WITH PROPOFOL;  Surgeon: Christena Deem, MD;  Location: [facility] ENDOSCOPY;  Service: Endoscopy;  Laterality: N/A;  . COLONOSCOPY  WITH PROPOFOL N/A 11/07/2019   Procedure: COLONOSCOPY WITH PROPOFOL;  Surgeon: Toledo, Boykin Nearing, MD;  Location: [facility] ENDOSCOPY;  Service: Gastroenterology;  Laterality: N/A;  . ESOPHAGOGASTRODUODENOSCOPY (EGD) WITH PROPOFOL N/A 09/16/2016   Procedure: ESOPHAGOGASTRODUODENOSCOPY (EGD) WITH PROPOFOL;  Surgeon: Christena Deem, MD;  Location: [facility] ENDOSCOPY;  Service: Endoscopy;  Laterality: N/A;  . ESOPHAGOGASTRODUODENOSCOPY (EGD) WITH PROPOFOL N/A 12/16/2016   Procedure: ESOPHAGOGASTRODUODENOSCOPY (EGD) WITH PROPOFOL;  Surgeon: Christena Deem, MD;  Location: [facility] ENDOSCOPY;  Service: Endoscopy;  Laterality: N/A;  . STOMACH SURGERY      Home Medications:  Allergies as of 07/24/2020      Reactions   Sucralfate Other (See Comments)   dizziness      Medication List       Accurate as of July 23, 2020  9:34 PM. If you have any questions, ask your nurse or doctor.        amLODipine 10 MG tablet Commonly known as: NORVASC TAKE 1 TABLET BY MOUTH ONCE DAILY   aspirin EC 81 MG tablet Take 81 mg by mouth daily.   atorvastatin 10 MG tablet Commonly known as: LIPITOR Take 10 mg by mouth daily.   hydrochlorothiazide 25 MG tablet Commonly known as: HYDRODIURIL Take 1 tablet (25 mg total) by mouth daily.   magnesium oxide 400 MG tablet Commonly known as: MAG-OX Take by mouth.   pantoprazole 40 MG tablet Commonly known as: PROTONIX Take 1 tablet by mouth daily.   polyethylene glycol 17 g packet Commonly known as: MIRALAX / GLYCOLAX Take 17 g by mouth daily as needed.   potassium chloride SA 20 MEQ tablet Commonly known as: KLOR-CON  Take 1 tablet (20 mEq total) by mouth daily for 3 days.       Allergies:  Allergies  Allergen Reactions  . Sucralfate Other (See Comments)    dizziness    Family History: No family history on file.  Social History:  reports that he quit smoking about 9 years ago. He has a 35.70 pack-year smoking history. He has never used smokeless  tobacco. He reports that he does not drink alcohol and does not use drugs.  ROS: Pertinent ROS in HPI  Physical Exam: There were no vitals taken for this visit.  Constitutional:  Well nourished. Alert and oriented, No acute distress. HEENT: [place] AT, moist mucus membranes.  Trachea midline Cardiovascular: No clubbing, cyanosis, or edema. Respiratory: Normal respiratory effort, no increased work of breathing. GI: Abdomen is soft, non tender, non distended, no abdominal masses. Liver and spleen not palpable.  No hernias appreciated.  Stool sample for occult testing is not indicated.   GU: No CVA tenderness.  No bladder fullness or masses.  Patient with circumcised/uncircumcised phallus. ***Foreskin easily retracted***  Urethral meatus is patent.  No penile discharge. No penile lesions or rashes. Scrotum without lesions, cysts, rashes and/or edema.  Testicles are located scrotally bilaterally. No masses are appreciated in the testicles. Left and right epididymis are normal. Rectal: Patient with  normal sphincter tone. Anus and perineum without scarring or rashes. No rectal masses are appreciated. Prostate is approximately *** grams, *** nodules are appreciated. Seminal vesicles are normal. Skin: No rashes, bruises or suspicious lesions. Lymph: No inguinal adenopathy. Neurologic: Grossly intact, no focal deficits, moving all 4 extremities. Psychiatric: Normal mood and affect.   Laboratory Data: WBC (White Blood Cell Count) 4.1 - 10.2 10^3/uL 4.9   RBC (Red Blood Cell Count) 4.69 - 6.13 10^6/uL 5.05   Hemoglobin 14.1 - 18.1 gm/dL 14.1   Hematocrit 40.0 - 52.0 % 43.8   MCV (Mean Corpuscular Volume) 80.0 - 100.0 fl 86.7   MCH (Mean Corpuscular Hemoglobin) 27.0 - 31.2 pg 27.9   MCHC (Mean Corpuscular Hemoglobin Concentration) 32.0 - 36.0 gm/dL 32.2   Platelet Count 150 - 450 10^3/uL 205   RDW-CV (Red Cell Distribution Width) 11.6 - 14.8 % 14.0   MPV (Mean Platelet Volume) 9.4 - 12.4 fl 8.5Low    Neutrophils 1.50 - 7.80 10^3/uL 2.24   Lymphocytes 1.00 - 3.60 10^3/uL 1.85   Monocytes 0.00 - 1.50 10^3/uL 0.61   Eosinophils 0.00 - 0.55 10^3/uL 0.10   Basophils 0.00 - 0.09 10^3/uL 0.07   Neutrophil % 32.0 - 70.0 % 46.0   Lymphocyte % 10.0 - 50.0 % 38.0   Monocyte % 4.0 - 13.0 % 12.5   Eosinophil % 1.0 - 5.0 % 2.1   Basophil% 0.0 - 2.0 % 1.4   Immature Granulocyte % <=0.7 % 0.0   Immature Granulocyte Count <=0.06 10^3/L 0.00   Resulting Agency  [facility] - [facility]  Specimen Collected: 05/14/20 07:20 Last Resulted: 05/14/20 08:21  Received From: [facility]  Result Received: 07/02/20 13:01   Glucose 70 - 110 mg/dL 102   Sodium 136 - 145 mmol/L 140   Potassium 3.6 - 5.1 mmol/L 4.1   Chloride 97 - 109 mmol/L 102   Carbon Dioxide (CO2) 22.0 - 32.0 mmol/L 32.5High   Calcium 8.7 - 10.3 mg/dL 9.4   Urea Nitrogen (BUN) 7 - 25 mg/dL 24   Creatinine 0.7 - 1.3 mg/dL 1.2   Glomerular Filtration Rate (eGFR), MDRD Estimate >60  mL/min/1.73sq m 72   BUN/Crea Ratio 6.0 - 20.0 20.0   Anion Gap w/K 6.0 - 16.0 9.6   Resulting Agency  [facility] - [facility]  Specimen Collected: 05/14/20 07:20 Last Resulted: 05/14/20 09:31  Received From: [facility]  Result Received: 07/02/20 13:01    Lab Results  Component Value Date   PSA 1.9 12/15/2010    Lab Results  Component Value Date   AST 21 02/18/2020   Lab Results  Component Value Date   ALT 12 02/18/2020    Urinalysis *** I have reviewed the labs.   Pertinent Imaging: Results for NIXXON, FARIA (MRN [id number]) as of 10/18/2019 13:07  Ref. Range 10/18/2019 13:03  Scan Result Unknown 56 ML   Assessment & Plan:   1. Microscopic hematuria Completed workup in 06/2019- findings positive for Nonobstructing 2 mm interpolar left renal stone and enlarged prostate w/ trilobar coaptation and intravesical protrusion into bladder  Moderately trabeculated bladder w/ saccules UA today  is clear Patient states he has noted dark-colored urine, I explained that this may be the result of dehydration or blood in the urine.  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 BPH with LUTS IPSS score is 7/1 Continue conservative management, avoiding bladder irritants and timed voiding's Most bothersome symptoms is/are infrequent hesitancy Still not wanting to start medication RTC in 4 months for I PSS, PVR and exam    No follow-ups on file.  These notes generated with voice recognition software. I apologize for typographical errors.  Zara Council, PA-C  [facility] [address]  [place], [place] [address] [phone]

## 2020-07-24 ENCOUNTER — Ambulatory Visit: Payer: Medicare Other | Admitting: Urology

## 2020-07-24 DIAGNOSIS — N401 Enlarged prostate with lower urinary tract symptoms: Secondary | ICD-10-CM

## 2020-07-24 DIAGNOSIS — R319 Hematuria, unspecified: Secondary | ICD-10-CM

## 2020-07-25 ENCOUNTER — Encounter: Payer: Self-pay | Admitting: Urology

## 2020-08-06 ENCOUNTER — Other Ambulatory Visit: Payer: Self-pay

## 2020-08-06 DIAGNOSIS — D72819 Decreased white blood cell count, unspecified: Secondary | ICD-10-CM

## 2020-08-11 ENCOUNTER — Inpatient Hospital Stay: Payer: 59

## 2020-08-11 ENCOUNTER — Other Ambulatory Visit: Payer: Medicare Other

## 2020-08-12 ENCOUNTER — Inpatient Hospital Stay: Payer: 59

## 2020-08-12 ENCOUNTER — Ambulatory Visit: Payer: Medicare Other | Admitting: Nurse Practitioner

## 2020-08-12 ENCOUNTER — Other Ambulatory Visit: Payer: Self-pay

## 2020-08-12 ENCOUNTER — Ambulatory Visit
Admission: RE | Admit: 2020-08-12 | Discharge: 2020-08-12 | Disposition: A | Discharge status: Home / Self Care | Payer: 59 | Source: Ambulatory Visit | Attending: Hematology and Oncology | Admitting: Hematology and Oncology

## 2020-08-12 ENCOUNTER — Other Ambulatory Visit: Payer: Medicare Other

## 2020-08-12 DIAGNOSIS — C3411 Malignant neoplasm of upper lobe, right bronchus or lung: Secondary | ICD-10-CM | POA: Insufficient documentation

## 2020-08-13 ENCOUNTER — Other Ambulatory Visit: Payer: Medicare Other

## 2020-08-13 ENCOUNTER — Inpatient Hospital Stay: Payer: 59 | Attending: Oncology

## 2020-08-13 ENCOUNTER — Encounter: Payer: Self-pay | Admitting: Oncology

## 2020-08-13 ENCOUNTER — Inpatient Hospital Stay: Payer: 59

## 2020-08-13 ENCOUNTER — Inpatient Hospital Stay (HOSPITAL_BASED_OUTPATIENT_CLINIC_OR_DEPARTMENT_OTHER): Payer: 59 | Admitting: Oncology

## 2020-08-13 VITALS — BP 129/59 | HR 66 | Temp 96.7°F | Resp 20 | Wt 210.2 lb

## 2020-08-13 DIAGNOSIS — Z87891 Personal history of nicotine dependence: Secondary | ICD-10-CM | POA: Insufficient documentation

## 2020-08-13 DIAGNOSIS — N189 Chronic kidney disease, unspecified: Secondary | ICD-10-CM | POA: Diagnosis not present

## 2020-08-13 DIAGNOSIS — E538 Deficiency of other specified B group vitamins: Secondary | ICD-10-CM | POA: Diagnosis not present

## 2020-08-13 DIAGNOSIS — Z85118 Personal history of other malignant neoplasm of bronchus and lung: Secondary | ICD-10-CM

## 2020-08-13 DIAGNOSIS — Z08 Encounter for follow-up examination after completed treatment for malignant neoplasm: Secondary | ICD-10-CM

## 2020-08-13 DIAGNOSIS — Z923 Personal history of irradiation: Secondary | ICD-10-CM | POA: Insufficient documentation

## 2020-08-13 DIAGNOSIS — I252 Old myocardial infarction: Secondary | ICD-10-CM | POA: Insufficient documentation

## 2020-08-13 DIAGNOSIS — D72819 Decreased white blood cell count, unspecified: Secondary | ICD-10-CM

## 2020-08-13 DIAGNOSIS — I739 Peripheral vascular disease, unspecified: Secondary | ICD-10-CM | POA: Diagnosis not present

## 2020-08-13 DIAGNOSIS — I129 Hypertensive chronic kidney disease with stage 1 through stage 4 chronic kidney disease, or unspecified chronic kidney disease: Secondary | ICD-10-CM | POA: Insufficient documentation

## 2020-08-13 DIAGNOSIS — E785 Hyperlipidemia, unspecified: Secondary | ICD-10-CM | POA: Insufficient documentation

## 2020-08-13 DIAGNOSIS — C3411 Malignant neoplasm of upper lobe, right bronchus or lung: Secondary | ICD-10-CM | POA: Diagnosis present

## 2020-08-13 LAB — CBC WITH DIFFERENTIAL/PLATELET
Abs Immature Granulocytes: 0.02 10*3/uL (ref 0.00–0.07)
Basophils Absolute: 0.1 10*3/uL (ref 0.0–0.1)
Basophils Relative: 1 %
Eosinophils Absolute: 0.1 10*3/uL (ref 0.0–0.5)
Eosinophils Relative: 3 %
HCT: 41.9 % (ref 39.0–52.0)
Hemoglobin: 14 g/dL (ref 13.0–17.0)
Immature Granulocytes: 1 %
Lymphocytes Relative: 40 %
Lymphs Abs: 1.8 10*3/uL (ref 0.7–4.0)
MCH: 28.3 pg (ref 26.0–34.0)
MCHC: 33.4 g/dL (ref 30.0–36.0)
MCV: 84.6 fL (ref 80.0–100.0)
Monocytes Absolute: 0.6 10*3/uL (ref 0.1–1.0)
Monocytes Relative: 13 %
Neutro Abs: 1.9 10*3/uL (ref 1.7–7.7)
Neutrophils Relative %: 42 %
Platelets: 222 10*3/uL (ref 150–400)
RBC: 4.95 MIL/uL (ref 4.22–5.81)
RDW: 14.2 % (ref 11.5–15.5)
WBC: 4.4 10*3/uL (ref 4.0–10.5)
nRBC: 0 % (ref 0.0–0.2)

## 2020-08-13 LAB — COMPREHENSIVE METABOLIC PANEL
ALT: 15 U/L (ref 0–44)
AST: 23 U/L (ref 15–41)
Albumin: 3.9 g/dL (ref 3.5–5.0)
Alkaline Phosphatase: 53 U/L (ref 38–126)
Anion gap: 8 (ref 5–15)
BUN: 26 mg/dL — ABNORMAL HIGH (ref 8–23)
CO2: 27 mmol/L (ref 22–32)
Calcium: 9.3 mg/dL (ref 8.9–10.3)
Chloride: 100 mmol/L (ref 98–111)
Creatinine, Ser: 1.23 mg/dL (ref 0.61–1.24)
GFR, Estimated: >60 mL/min (ref >60)
Glucose, Bld: 115 mg/dL — ABNORMAL HIGH (ref 70–99)
Potassium: 3.4 mmol/L — ABNORMAL LOW (ref 3.5–5.1)
Sodium: 135 mmol/L (ref 135–145)
Total Bilirubin: 0.9 mg/dL (ref 0.3–1.2)
Total Protein: 7.8 g/dL (ref 6.5–8.1)

## 2020-08-13 LAB — VITAMIN B12: Vitamin B-12: 765 pg/mL (ref 180–914)

## 2020-08-13 NOTE — Progress Notes (Signed)
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 male with stage IARUL lung cancers/p CT guided biopsy on 06/01/2017. Pathologyrevealed adenocarcinoma, acinar and solid pattern.  PET scanon 05/04/2017 revealed a markedly hypermetabolic 19 mm right upper lobe pulmonary lesionc/wprimary lung neoplasm.There were no enlarged or hypermetabolic mediastinal/hilar lymph nodes.There was a single (poorly visualized on CT) hypermetabolic hepatic lesion near the gallbladder fossa.Liver MRIon 05/12/2017 revealed no evidence of metastatic disease.  He declined surgery. He received 4 cycles of carboplatin and Alimta(06/14/2017 - 08/16/2017). He received SBRTfrom 11/01/2017 - 11/13/2017. He received6000 cGy in 5 fractions.  Patient has remained in remission since then  Interval history-currently patient reports doing well.  Energy levels at his baseline.  Denies any worsening shortness of breath.  Appetite and weight have remained stable  ECOG PS- 1 Pain scale- 0   Review of systems- Review of Systems  Constitutional: Negative for chills, fever, malaise/fatigue and weight loss.  HENT: Negative for congestion, ear discharge and  nosebleeds.   Eyes: Negative for blurred vision.  Respiratory: Negative for cough, hemoptysis, sputum production, shortness of breath and wheezing.   Cardiovascular: Negative for chest pain, palpitations, orthopnea and claudication.  Gastrointestinal: Negative for abdominal pain, blood in stool, constipation, diarrhea, heartburn, melena, nausea and vomiting.  Genitourinary: Negative for dysuria, flank pain, frequency, hematuria and urgency.  Musculoskeletal: Negative for back pain, joint pain and myalgias.  Skin: Negative for rash.  Neurological: Negative for dizziness, tingling, focal weakness, seizures, weakness and headaches.  Endo/Heme/Allergies: Does not bruise/bleed easily.  Psychiatric/Behavioral: Negative for depression and suicidal ideas. The patient does not have insomnia.      Allergies  Allergen Reactions  . Sucralfate Other (See Comments)    dizziness     Past Medical History:  Diagnosis Date  . Cancer ([place])   . Chronic Diaz disease   . Dysphagia   . GERD (gastroesophageal reflux disease)   . History of BPH   . Hyperlipidemia   . Hypertension   . Myocardial infarction ([place])   . Peripheral vascular disease [facility])      Past Surgical History:  Procedure Laterality Date  . APPENDECTOMY    . COLONOSCOPY WITH PROPOFOL N/A 09/16/2016   Procedure: COLONOSCOPY WITH PROPOFOL;  Surgeon: [name], MD;  Location: [facility] ENDOSCOPY;  Service: Endoscopy;  Laterality: N/A;  . COLONOSCOPY WITH PROPOFOL N/A 11/07/2019   Procedure: COLONOSCOPY WITH PROPOFOL;  Surgeon: Toledo, Benay Pike, MD;  Location: [facility] ENDOSCOPY;  Service: Gastroenterology;  Laterality: N/A;  . ESOPHAGOGASTRODUODENOSCOPY (EGD) WITH PROPOFOL N/A 09/16/2016   Procedure: ESOPHAGOGASTRODUODENOSCOPY (EGD) WITH PROPOFOL;  Surgeon: [name], MD;  Location: [facility] ENDOSCOPY;  Service: Endoscopy;  Laterality: N/A;  . ESOPHAGOGASTRODUODENOSCOPY (EGD) WITH PROPOFOL N/A 12/16/2016   Procedure:  ESOPHAGOGASTRODUODENOSCOPY (EGD) WITH PROPOFOL;  Surgeon: [name], MD;  Location: [facility] ENDOSCOPY;  Service: Endoscopy;  Laterality: N/A;  . STOMACH SURGERY      Social History   Socioeconomic History  . Marital status: Single    Spouse name: Not on file  . Number of children: Not on file  . Years of education: Not on file  . Highest education level: Not on file  Occupational History  . Not on file  Tobacco Use  . Smoking status: Former Smoker    Packs/day: 0.70    Years: 51.00    Pack years: 35.70    Quit date: 2013    Years since quitting: 9.3  . Smokeless tobacco: Never Used  Vaping Use  . Vaping Use: Never used  Substance and Sexual Activity  . Alcohol use: No  . Drug use: No  . Sexual activity: Not on file  Other Topics Concern  . Not on file  Social History Narrative  . Not on file   Social Determinants of Health   Financial Resource Strain: Not on file  Food Insecurity: Not on file  Transportation Needs: Not on file  Physical Activity: Not on file  Stress: Not on file  Social Connections: Not on file  Intimate Partner Violence: Not on file    History reviewed. No pertinent family history.   Current Outpatient Medications:  .  amLODipine (NORVASC) 10 MG tablet, TAKE 1 TABLET BY MOUTH ONCE DAILY, Disp: 90 tablet, Rfl: 0 .  aspirin EC 81 MG tablet, Take 81 mg by mouth daily., Disp: , Rfl:  .  atorvastatin (LIPITOR) 10 MG tablet, Take 10 mg by mouth daily., Disp: , Rfl:  .  hydrochlorothiazide (HYDRODIURIL) 25 MG tablet, Take 1 tablet (25 mg total) by mouth daily., Disp: 30 tablet, Rfl: 3 .  magnesium oxide (MAG-OX) 400 MG tablet, Take by mouth., Disp: , Rfl:  .  pantoprazole (PROTONIX) 40 MG tablet, Take 1 tablet by mouth daily., Disp: , Rfl:  .  polyethylene glycol (MIRALAX / GLYCOLAX) 17 g packet, Take 17 g by mouth daily as needed. , Disp: , Rfl:  .  potassium chloride SA (KLOR-CON) 20 MEQ tablet, Take 1 tablet (20 mEq total) by mouth daily for  3 days., Disp: 3 tablet, Rfl: 0  Physical exam:  Vitals:   08/13/20 1129  BP: (!) 129/59  Pulse: 66  Resp: 20  Temp: (!) 96.7 F (35.9 C)  TempSrc: Tympanic  SpO2: 100%  Weight: 210 lb 3.3 oz (95.4 kg)   Physical Exam Constitutional:      General: He is not in acute distress. Cardiovascular:     Rate and Rhythm: Normal rate and regular rhythm.     Heart sounds: Normal heart sounds.  Pulmonary:     Effort: Pulmonary effort is normal.     Breath sounds: Normal breath sounds.  Skin:    General: Skin is warm and dry.  Neurological:     Mental Status: He is alert and oriented to person, place, and time.      CMP Latest Ref Rng & Units 08/13/2020  Glucose 70 - 99 mg/dL 115(H)  BUN 8 - 23 mg/dL 26(H)  Creatinine 0.61 - 1.24 mg/dL 1.23  Sodium 135 - 145 mmol/L 135  Potassium 3.5 - 5.1 mmol/L 3.4(L)  Chloride 98 - 111 mmol/L 100  CO2 22 - 32 mmol/L 27  Calcium 8.9 - 10.3 mg/dL 9.3  Total Protein 6.5 - 8.1 g/dL 7.8  Total Bilirubin 0.3 - 1.2 mg/dL 0.9  Alkaline Phos 38 - 126 U/L 53  AST 15 -  41 U/L 23  ALT 0 - 44 U/L 15   CBC Latest Ref Rng & Units 08/13/2020  WBC 4.0 - 10.5 K/uL 4.4  Hemoglobin 13.0 - 17.0 g/dL 14.0  Hematocrit 39.0 - 52.0 % 41.9  Platelets 150 - 400 K/uL 222    No images are attached to the encounter.  CT Chest Wo Contrast  Result Date: 08/13/2020 CLINICAL DATA:  Right upper lobe stage IA non-small cell lung cancer, surveillance EXAM: CT CHEST WITHOUT CONTRAST TECHNIQUE: Multidetector CT imaging of the chest was performed following the standard protocol without IV contrast. COMPARISON:  02/14/2020, 08/13/2019 FINDINGS: Cardiovascular: Aortic atherosclerosis. Normal heart size. Extensive Three-vessel coronary artery calcifications and/or stents. No pericardial effusion. Mediastinum/Nodes: No enlarged mediastinal, hilar, or axillary lymph nodes. Thyroid gland, trachea, and esophagus demonstrate no significant findings. Lungs/Pleura: Mild centrilobular  emphysema. Diffuse bilateral bronchial wall thickening. No significant change in post treatment radiation fibrosis of the anterior right pulmonary apex, a flattened subpleural region of fibrosis and consolidation measuring approximately 3.3 x 1.1 cm (series 3, image 30). Stable, definitively benign 5 mm subpleural nodule in the dependent left lower lobe (series 3, image 94). No pleural effusion or pneumothorax. Upper Abdomen: No acute abnormality. Musculoskeletal: No chest wall mass or suspicious bone lesions identified. IMPRESSION: 1. No significant change in post treatment radiation fibrosis of the anterior right pulmonary apex. No evidence of malignant recurrence or metastatic disease in the chest. 2. Stable, definitively benign 5 mm subpleural nodule in the dependent left lower lobe. 3. Emphysema and diffuse bilateral bronchial wall thickening. 4. Coronary artery disease. Aortic Atherosclerosis (ICD10-I70.0) and Emphysema (ICD10-J43.9). Electronically Signed   By: Eddie Candle M.D.   On: 08/13/2020 09:49     Assessment and plan- Patient is a 75 y.o. male with history of stage Ia adenocarcinoma of the right upper lobe in 2019 s/p chemotherapy and radiation currently on surveillance and this is a routine follow-up visit  Clinically patient is doing well with no concerning signs and symptoms of recurrence.  He had a CT chest without contrast yesterday which did not show any concerning findings of recurrent disease.  Posttreatment radiation fibrosis noted in the right apex.  Stable 5 mm subpleural nodule.  Since it has been more than 3 years since his diagnosis we can switch to yearly low-dose screening CT scans  I will see him in 1 year with a CT prior  Patient has some baseline leukopenia and his white cell count fluctuates between 3.3-5.5.  Today it is 4.4 with an Browns of 1.9 which is stable as compared to his prior values.  Continue to monitor  He has a history of B12 deficiency for which she is on  oral B12 and we will continue to monitor that as well.   Visit Diagnosis 1. Leukopenia, unspecified type   2. Encounter for follow-up surveillance of lung cancer   3. B12 deficiency      Dr. Randa Evens, MD, MPH [facility] at [facility] [phone] 08/13/2020 5:05 PM

## 2020-08-21 ENCOUNTER — Ambulatory Visit
Admission: RE | Admit: 2020-08-21 | Discharge: 2020-08-21 | Disposition: A | Discharge status: Home / Self Care | Payer: 59 | Source: Ambulatory Visit | Attending: Radiation Oncology | Admitting: Radiation Oncology

## 2020-08-21 VITALS — BP 124/67 | HR 67 | Temp 96.4°F | Wt 212.5 lb

## 2020-08-21 DIAGNOSIS — C3411 Malignant neoplasm of upper lobe, right bronchus or lung: Secondary | ICD-10-CM

## 2020-08-21 NOTE — Progress Notes (Signed)
Radiation Oncology Follow up Note  Name: Ruben Diaz   Date:   08/21/2020 MRN:  [id number] DOB: [birth year]    This 75 y.o. male presents to the clinic today for 3-year follow-up status post SBRT to right upper lobe for stage I adenocarcinoma.  REFERRING PROVIDER: Baxter Hire, MD  HPI: Patient is a 75 year old male now about 3 years having completed SBRT to right upper lobe for stage I adenocarcinoma.  Seen today in routine follow-up he is doing well no change in his breathing pattern no cough hemoptysis or chest tightness..  He had a CT scan recently showing no significant change in the posttreatment radiation fibrosis of the anterior right pulmonary apex.  No other evidence of progressive or distant disease.  COMPLICATIONS OF TREATMENT: none  FOLLOW UP COMPLIANCE: keeps appointments   PHYSICAL EXAM:  BP 124/67   Pulse 67   Temp (!) 96.4 F (35.8 C) (Tympanic)   Wt 212 lb 8.4 oz (96.4 kg)   SpO2 99%   BMI 28.82 kg/m  Well-developed well-nourished patient in NAD. HEENT reveals PERLA, EOMI, discs not visualized.  Oral cavity is clear. No oral mucosal lesions are identified. Neck is clear without evidence of cervical or supraclavicular adenopathy. Lungs are clear to A&P. Cardiac examination is essentially unremarkable with regular rate and rhythm without murmur rub or thrill. Abdomen is benign with no organomegaly or masses noted. Motor sensory and DTR levels are equal and symmetric in the upper and lower extremities. Cranial nerves II through XII are grossly intact. Proprioception is intact. No peripheral adenopathy or edema is identified. No motor or sensory levels are noted. Crude visual fields are within normal range.  RADIOLOGY RESULTS: CT scan reviewed compatible with above-stated findings  PLAN: Present time patient is doing well now 3 years from SBRT.  He has no signs of progressive disease.  And pleased with his overall progress.  Of asked to see him back in 1 year  for follow-up.  He continues follow-up care with Dr. Janese Banks.  Patient knows to call with any concerns.  I would like to take this opportunity to thank you for allowing me to participate in the care of your patient.Noreene Filbert, MD

## 2020-09-08 ENCOUNTER — Other Ambulatory Visit: Payer: Medicare Other

## 2020-09-08 ENCOUNTER — Ambulatory Visit: Payer: Medicare Other | Admitting: Hematology and Oncology

## 2021-08-06 ENCOUNTER — Encounter: Payer: Self-pay | Admitting: Internal Medicine

## 2021-08-12 ENCOUNTER — Ambulatory Visit: Payer: 59 | Admitting: Oncology

## 2021-08-12 ENCOUNTER — Other Ambulatory Visit: Payer: 59

## 2021-08-12 ENCOUNTER — Ambulatory Visit: Payer: 59

## 2021-08-13 ENCOUNTER — Inpatient Hospital Stay: Payer: Medicare Other | Attending: Oncology

## 2021-08-13 ENCOUNTER — Ambulatory Visit
Admission: RE | Admit: 2021-08-13 | Discharge: 2021-08-13 | Disposition: A | Discharge status: Home / Self Care | Payer: Medicare Other | Source: Ambulatory Visit | Attending: Oncology | Admitting: Oncology

## 2021-08-13 DIAGNOSIS — I129 Hypertensive chronic kidney disease with stage 1 through stage 4 chronic kidney disease, or unspecified chronic kidney disease: Secondary | ICD-10-CM | POA: Insufficient documentation

## 2021-08-13 DIAGNOSIS — N189 Chronic kidney disease, unspecified: Secondary | ICD-10-CM | POA: Insufficient documentation

## 2021-08-13 DIAGNOSIS — Z08 Encounter for follow-up examination after completed treatment for malignant neoplasm: Secondary | ICD-10-CM | POA: Diagnosis present

## 2021-08-13 DIAGNOSIS — Z85118 Personal history of other malignant neoplasm of bronchus and lung: Secondary | ICD-10-CM | POA: Diagnosis present

## 2021-08-13 DIAGNOSIS — Z87891 Personal history of nicotine dependence: Secondary | ICD-10-CM | POA: Insufficient documentation

## 2021-08-13 DIAGNOSIS — C3411 Malignant neoplasm of upper lobe, right bronchus or lung: Secondary | ICD-10-CM | POA: Insufficient documentation

## 2021-08-18 ENCOUNTER — Other Ambulatory Visit: Payer: Self-pay

## 2021-08-18 DIAGNOSIS — E538 Deficiency of other specified B group vitamins: Secondary | ICD-10-CM

## 2021-08-18 DIAGNOSIS — D72819 Decreased white blood cell count, unspecified: Secondary | ICD-10-CM

## 2021-08-19 ENCOUNTER — Other Ambulatory Visit: Payer: 59

## 2021-08-19 ENCOUNTER — Inpatient Hospital Stay: Payer: Medicare Other

## 2021-08-19 ENCOUNTER — Ambulatory Visit: Payer: 59

## 2021-08-19 ENCOUNTER — Encounter: Payer: Self-pay | Admitting: Oncology

## 2021-08-19 ENCOUNTER — Inpatient Hospital Stay (HOSPITAL_BASED_OUTPATIENT_CLINIC_OR_DEPARTMENT_OTHER): Payer: Medicare Other | Admitting: Oncology

## 2021-08-19 ENCOUNTER — Ambulatory Visit: Payer: 59 | Admitting: Oncology

## 2021-08-19 VITALS — BP 121/74 | HR 67 | Temp 97.5°F | Resp 16 | Wt 201.7 lb

## 2021-08-19 DIAGNOSIS — Z85118 Personal history of other malignant neoplasm of bronchus and lung: Secondary | ICD-10-CM

## 2021-08-19 DIAGNOSIS — N189 Chronic kidney disease, unspecified: Secondary | ICD-10-CM | POA: Diagnosis not present

## 2021-08-19 DIAGNOSIS — E538 Deficiency of other specified B group vitamins: Secondary | ICD-10-CM

## 2021-08-19 DIAGNOSIS — I129 Hypertensive chronic kidney disease with stage 1 through stage 4 chronic kidney disease, or unspecified chronic kidney disease: Secondary | ICD-10-CM | POA: Diagnosis not present

## 2021-08-19 DIAGNOSIS — Z08 Encounter for follow-up examination after completed treatment for malignant neoplasm: Secondary | ICD-10-CM | POA: Diagnosis not present

## 2021-08-19 DIAGNOSIS — Z87891 Personal history of nicotine dependence: Secondary | ICD-10-CM | POA: Diagnosis not present

## 2021-08-19 DIAGNOSIS — C3411 Malignant neoplasm of upper lobe, right bronchus or lung: Secondary | ICD-10-CM | POA: Diagnosis not present

## 2021-08-19 LAB — COMPREHENSIVE METABOLIC PANEL
ALT: 13 U/L (ref 0–44)
AST: 25 U/L (ref 15–41)
Albumin: 3.8 g/dL (ref 3.5–5.0)
Alkaline Phosphatase: 50 U/L (ref 38–126)
Anion gap: 7 (ref 5–15)
BUN: 29 mg/dL — ABNORMAL HIGH (ref 8–23)
CO2: 27 mmol/L (ref 22–32)
Calcium: 9.2 mg/dL (ref 8.9–10.3)
Chloride: 99 mmol/L (ref 98–111)
Creatinine, Ser: 1.33 mg/dL — ABNORMAL HIGH (ref 0.61–1.24)
GFR, Estimated: 55 mL/min — ABNORMAL LOW (ref >60)
Glucose, Bld: 93 mg/dL (ref 70–99)
Potassium: 4 mmol/L (ref 3.5–5.1)
Sodium: 133 mmol/L — ABNORMAL LOW (ref 135–145)
Total Bilirubin: 0.7 mg/dL (ref 0.3–1.2)
Total Protein: 8 g/dL (ref 6.5–8.1)

## 2021-08-19 LAB — CBC WITH DIFFERENTIAL/PLATELET
Abs Immature Granulocytes: 0.02 10*3/uL (ref 0.00–0.07)
Basophils Absolute: 0.1 10*3/uL (ref 0.0–0.1)
Basophils Relative: 1 %
Eosinophils Absolute: 0.1 10*3/uL (ref 0.0–0.5)
Eosinophils Relative: 3 %
HCT: 44.1 % (ref 39.0–52.0)
Hemoglobin: 14.5 g/dL (ref 13.0–17.0)
Immature Granulocytes: 0 %
Lymphocytes Relative: 38 %
Lymphs Abs: 1.8 10*3/uL (ref 0.7–4.0)
MCH: 28.5 pg (ref 26.0–34.0)
MCHC: 32.9 g/dL (ref 30.0–36.0)
MCV: 86.8 fL (ref 80.0–100.0)
Monocytes Absolute: 0.6 10*3/uL (ref 0.1–1.0)
Monocytes Relative: 12 %
Neutro Abs: 2.2 10*3/uL (ref 1.7–7.7)
Neutrophils Relative %: 46 %
Platelets: 204 10*3/uL (ref 150–400)
RBC: 5.08 MIL/uL (ref 4.22–5.81)
RDW: 13.9 % (ref 11.5–15.5)
WBC: 4.8 10*3/uL (ref 4.0–10.5)
nRBC: 0 % (ref 0.0–0.2)

## 2021-08-19 LAB — VITAMIN B12: Vitamin B-12: 1009 pg/mL — ABNORMAL HIGH (ref 180–914)

## 2021-08-19 NOTE — Progress Notes (Signed)
Hematology/Oncology Consult note [facility]  Telephone:([phone] [phone]  Patient Care Team: Baxter Hire, MD as PCP - General (Internal Medicine) Telford Nab, RN as Registered Nurse Rogue Bussing, Elisha Headland, MD as Consulting Physician (Internal Medicine) [name], MD (Inactive) as Referring Physician (Cardiothoracic Surgery) Noreene Filbert, MD as Referring Physician (Radiation Oncology) Lequita Asal, MD (Inactive) as Referring Physician (Hematology and Oncology)   Name of the patient: Ruben Diaz  [id number]  1945/10/28   Date of visit: 08/19/21  Diagnosis- 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 male with stage IA RUL lung cancer s/p CT guided biopsy on 06/01/2017.  Pathology revealed adenocarcinoma, acinar and solid pattern.   PET scan on 05/04/2017 revealed a markedly hypermetabolic 19 mm right upper lobe pulmonary lesion c/w primary lung neoplasm. There were no enlarged or hypermetabolic mediastinal/hilar lymph nodes.  There was a single (poorly visualized on CT) hypermetabolic hepatic lesion near the gallbladder fossa.   Liver MRI on 05/12/2017 revealed no evidence of metastatic disease.   He declined surgery.  He received 4 cycles of carboplatin and Alimta (06/14/2017 - 08/16/2017).  He received SBRT from 11/01/2017 - 11/13/2017.  He received 6000 cGy in 5 fractions.   Patient has remained in remission since then    Interval history-he is doing well for his age.  No recent hospitalizations.  He has baseline fatigue and exertional shortness of breath  ECOG PS- 1 Pain scale- 0  Review of systems- Review of Systems  Constitutional:  Positive for malaise/fatigue. Negative for chills, fever and weight loss.  HENT:  Negative for congestion, ear discharge and  nosebleeds.   Eyes:  Negative for blurred vision.  Respiratory:  Negative for cough, hemoptysis, sputum production, shortness of breath and wheezing.   Cardiovascular:  Negative for chest pain, palpitations, orthopnea and claudication.  Gastrointestinal:  Negative for abdominal pain, blood in stool, constipation, diarrhea, heartburn, melena, nausea and vomiting.  Genitourinary:  Negative for dysuria, flank pain, frequency, hematuria and urgency.  Musculoskeletal:  Negative for back pain, joint pain and myalgias.  Skin:  Negative for rash.  Neurological:  Negative for dizziness, tingling, focal weakness, seizures, weakness and headaches.  Endo/Heme/Allergies:  Does not bruise/bleed easily.  Psychiatric/Behavioral:  Negative for depression and suicidal ideas. The patient does not have insomnia.       Allergies  Allergen Reactions   Sucralfate Other (See Comments)    dizziness     Past Medical History:  Diagnosis Date   Cancer ([place])    Chronic kidney disease    Dysphagia    GERD (gastroesophageal reflux disease)    History of BPH    Hyperlipidemia    Hypertension    Myocardial infarction [facility])    Peripheral vascular disease ([place])      Past Surgical History:  Procedure Laterality Date   APPENDECTOMY     COLONOSCOPY WITH PROPOFOL N/A 09/16/2016   Procedure: COLONOSCOPY WITH PROPOFOL;  Surgeon: [name], MD;  [facility] ENDOSCOPY;  Service: Endoscopy;  Laterality: N/A;   COLONOSCOPY WITH PROPOFOL N/A 11/07/2019   Procedure: COLONOSCOPY WITH PROPOFOL;  Surgeon: Toledo, Benay Pike, MD;  Location: [facility] ENDOSCOPY;  Service: Gastroenterology;  Laterality: N/A;   ESOPHAGOGASTRODUODENOSCOPY (EGD) WITH PROPOFOL N/A 09/16/2016   Procedure: ESOPHAGOGASTRODUODENOSCOPY (EGD) WITH PROPOFOL;  Surgeon: [name], MD;  Location:  [place] ENDOSCOPY;  Service: Endoscopy;  Laterality: N/A;   ESOPHAGOGASTRODUODENOSCOPY (EGD) WITH PROPOFOL N/A 12/16/2016   Procedure:  ESOPHAGOGASTRODUODENOSCOPY (EGD) WITH PROPOFOL;  Surgeon: [name], MD;  Location: [facility] ENDOSCOPY;  Service: Endoscopy;  Laterality: N/A;   STOMACH SURGERY      Social History   Socioeconomic History   Marital status: Single    Spouse name: Not on file   Number of children: Not on file   Years of education: Not on file   Highest education level: Not on file  Occupational History   Not on file  Tobacco Use   Smoking status: Former    Packs/day: 0.70    Years: 51.00    Pack years: 35.70    Types: Cigarettes    Quit date: 2013    Years since quitting: 10.3   Smokeless tobacco: Never  Vaping Use   Vaping Use: Never used  Substance and Sexual Activity   Alcohol use: No   Drug use: No   Sexual activity: Yes  Other Topics Concern   Not on file  Social History Narrative   Not on file   Social Determinants of Health   Financial Resource Strain: Not on file  Food Insecurity: Not on file  Transportation Needs: No Transportation Needs   Lack of Transportation (Medical): No   Lack of Transportation (Non-Medical): No  Physical Activity: Not on file  Stress: Not on file  Social Connections: Not on file  Intimate Partner Violence: Not on file    History reviewed. No pertinent family history.   Current Outpatient Medications:    amLODipine (NORVASC) 10 MG tablet, TAKE 1 TABLET BY MOUTH ONCE DAILY, Disp: 90 tablet, Rfl: 0   aspirin EC 81 MG tablet, Take 81 mg by mouth daily., Disp: , Rfl:    atorvastatin (LIPITOR) 10 MG tablet, Take 10 mg by mouth daily., Disp: , Rfl:    hydrochlorothiazide (HYDRODIURIL) 25 MG tablet, Take 1 tablet (25 mg total) by mouth daily., Disp: 30 tablet, Rfl: 3   pantoprazole (PROTONIX) 40 MG tablet, Take 1 tablet by mouth daily., Disp: , Rfl:    potassium chloride SA (KLOR-CON) 20 MEQ tablet, Take 1 tablet (20 mEq total) by mouth daily for 3 days., Disp: 3 tablet, Rfl: 0   vitamin B-12 (CYANOCOBALAMIN) 1000 MCG tablet, Take 1,000 mcg by mouth  daily., Disp: , Rfl:   Physical exam:  Vitals:   08/19/21 1105  BP: 121/74  Pulse: 67  Resp: 16  Temp: (!) 97.5 F (36.4 C)  TempSrc: Tympanic  Weight: 201 lb 11.2 oz (91.5 kg)   Physical Exam Constitutional:      General: He is not in acute distress. Cardiovascular:     Rate and Rhythm: Normal rate and regular rhythm.     Heart sounds: Normal heart sounds.  Pulmonary:     Effort: Pulmonary effort is normal.     Breath sounds: Normal breath sounds.  Abdominal:     General: Bowel sounds are normal.     Palpations: Abdomen is soft.  Skin:    General: Skin is warm and dry.  Neurological:     Mental Status: He is alert and oriented to person, place, and time.        Latest Ref Rng & Units 08/19/2021   10:36 AM  CMP  Glucose 70 - 99 mg/dL 93    BUN 8 - 23 mg/dL 29    Creatinine 0.61 - 1.24 mg/dL 1.33    Sodium  135 - 145 mmol/L 133    Potassium 3.5 - 5.1 mmol/L 4.0    Chloride 98 - 111 mmol/L 99    CO2 22 - 32 mmol/L 27    Calcium 8.9 - 10.3 mg/dL 9.2    Total Protein 6.5 - 8.1 g/dL 8.0    Total Bilirubin 0.3 - 1.2 mg/dL 0.7    Alkaline Phos 38 - 126 U/L 50    AST 15 - 41 U/L 25    ALT 0 - 44 U/L 13        Latest Ref Rng & Units 08/19/2021   10:36 AM  CBC  WBC 4.0 - 10.5 K/uL 4.8    Hemoglobin 13.0 - 17.0 g/dL 14.5    Hematocrit 39.0 - 52.0 % 44.1    Platelets 150 - 400 K/uL 204      No images are attached to the encounter.  CT CHEST WO CONTRAST  Result Date: 08/14/2021 CLINICAL DATA:  Non-small cell lung cancer restaging. * Tracking Code: BO * EXAM: CT CHEST WITHOUT CONTRAST TECHNIQUE: Multidetector CT imaging of the chest was performed following the standard protocol without IV contrast. RADIATION DOSE REDUCTION: This exam was performed according to the departmental dose-optimization program which includes automated exposure control, adjustment of the mA and/or kV according to patient size and/or use of iterative reconstruction technique. COMPARISON:   08/12/2020 FINDINGS: Cardiovascular: Coronary, aortic arch, and branch vessel atherosclerotic vascular disease. Mediastinum/Nodes: Unremarkable Lungs/Pleura: Biapical pleuroparenchymal scarring. Chronically stable subpleural consolidation anteriorly in the right upper lobe corresponding to the treated lesion is observed, by my measurements this was previously 1.2 cm in thickness and is currently 1.2 cm in thickness. No contour change or suspicious related findings. Airway thickening is present, suggesting bronchitis or reactive airways disease. Centrilobular emphysema. 6 by 4 mm left lower lobe nodule on image 99 series 3, chronically stable, benign. 3 mm subpleural nodule in the left upper lobe on image 22 series 3, chronically stable, benign. Upper Abdomen: Stable hypodense liver lesions are probably cysts as shown 05/12/2017 MRI. Abdominal aortic atherosclerosis with atherosclerotic calcification in the proximal SMA. Left kidney upper pole nonobstructive renal calculus on image 166 series 2. Musculoskeletal: Degenerative glenohumeral arthropathy on the right. Thoracic spondylosis. IMPRESSION: 1. Post therapy related findings anteriorly in the right upper lobe, without findings of recurrent malignancy. 2. Aortic Atherosclerosis (ICD10-I70.0) and Emphysema (ICD10-J43.9). Coronary atherosclerosis. Abdominal aortic atherosclerosis with atherosclerotic calcification in the superior mesenteric artery. 3. Airway thickening is present, suggesting bronchitis or reactive airways disease. 4. Nonobstructive left nephrolithiasis. Electronically Signed   By: Van Clines M.D.   On: 08/14/2021 07:43     Assessment and plan- Patient is a 76 y.o. male with history of stage Ia adenocarcinoma of the right upper lobe in 2019 s/p chemotherapy and radiation.  He is currently in remission and this is a routine follow-up visit for lung cancer  I have reviewed CT chest images independently and discussed findings with the  patient which does not show any evidence ofProgressive disease.  Postradiation changes in the right upper lobe.  I will see him back in 1 year with a repeat CT scan.  He also has baseline leukopenia with a white count that fluctuates between 3.5-5 and is overall stable.  Continue to monitor   Visit Diagnosis 1. Encounter for follow-up surveillance of lung cancer      Dr. Randa Evens, MD, MPH [facility] at [facility] [phone] 08/19/2021 3:33 PM

## 2021-08-25 ENCOUNTER — Ambulatory Visit
Admission: RE | Admit: 2021-08-25 | Discharge: 2021-08-25 | Disposition: A | Discharge status: Home / Self Care | Payer: Medicare Other | Source: Ambulatory Visit | Attending: Radiation Oncology | Admitting: Radiation Oncology

## 2021-08-25 ENCOUNTER — Inpatient Hospital Stay: Payer: Medicare Other

## 2021-08-25 VITALS — BP 121/59 | HR 62 | Temp 98.0°F | Ht 70.0 in | Wt 205.0 lb

## 2021-08-25 DIAGNOSIS — Z87891 Personal history of nicotine dependence: Secondary | ICD-10-CM | POA: Diagnosis not present

## 2021-08-25 DIAGNOSIS — Z923 Personal history of irradiation: Secondary | ICD-10-CM | POA: Insufficient documentation

## 2021-08-25 DIAGNOSIS — Z85118 Personal history of other malignant neoplasm of bronchus and lung: Secondary | ICD-10-CM | POA: Diagnosis present

## 2021-08-25 DIAGNOSIS — C3411 Malignant neoplasm of upper lobe, right bronchus or lung: Secondary | ICD-10-CM

## 2021-08-25 NOTE — Progress Notes (Signed)
Radiation Oncology Follow up Note  Name: Ruben Diaz   Date:   08/25/2021 MRN:  [id number] DOB: [birth year]    This 76 y.o. male presents to the clinic today for 4-year follow-up status post SBRT to his right upper lobe for stage I adenocarcinoma.  REFERRING PROVIDER: Baxter Hire, MD  HPI: Patient is a 76 year old male now out for years having completed SBRT to his right upper lobe for stage I adenocarcinoma seen today in routine follow-up he is doing well.  He has minor nonproductive cough no chest tightness or dysphagia.Marland Kitchen  Recent CT scan of his chest showed posttherapy changes in the anterior right upper lobe without findings of recurrent malignancy or progressive disease.  COMPLICATIONS OF TREATMENT: none  FOLLOW UP COMPLIANCE: keeps appointments   PHYSICAL EXAM:  BP (!) 121/59   Pulse 62   Temp 98 F (36.7 C)   Ht 5\' 10"  (1.778 m)   Wt 205 lb (93 kg)   BMI 29.41 kg/m  Well-developed well-nourished patient in NAD. HEENT reveals PERLA, EOMI, discs not visualized.  Oral cavity is clear. No oral mucosal lesions are identified. Neck is clear without evidence of cervical or supraclavicular adenopathy. Lungs are clear to A&P. Cardiac examination is essentially unremarkable with regular rate and rhythm without murmur rub or thrill. Abdomen is benign with no organomegaly or masses noted. Motor sensory and DTR levels are equal and symmetric in the upper and lower extremities. Cranial nerves II through XII are grossly intact. Proprioception is intact. No peripheral adenopathy or edema is identified. No motor or sensory levels are noted. Crude visual fields are within normal range.  RADIOLOGY RESULTS: Serial CT scans reviewed compatible with above-stated findings  PLAN: Present time patient is doing well with no evidence of disease now out over 4 years from SBRT.  I am going to turn follow-up care over to medical oncology who has been ordering his follow-up CT scans.  Patient is  to call with any concerns.  Be happy to reevaluate the patient anytime should that be indicated.  I would like to take this opportunity to thank you for allowing me to participate in the care of your patient.Noreene Filbert, MD

## 2021-09-01 ENCOUNTER — Other Ambulatory Visit: Payer: Self-pay | Admitting: Physician Assistant

## 2021-09-01 DIAGNOSIS — R42 Dizziness and giddiness: Secondary | ICD-10-CM

## 2021-09-01 DIAGNOSIS — R413 Other amnesia: Secondary | ICD-10-CM

## 2021-09-01 DIAGNOSIS — Z85118 Personal history of other malignant neoplasm of bronchus and lung: Secondary | ICD-10-CM

## 2021-09-02 ENCOUNTER — Encounter: Payer: Self-pay | Admitting: Internal Medicine

## 2021-09-16 ENCOUNTER — Ambulatory Visit
Admission: RE | Admit: 2021-09-16 | Discharge: 2021-09-16 | Disposition: A | Discharge status: Home / Self Care | Payer: 59 | Source: Ambulatory Visit | Attending: Physician Assistant | Admitting: Physician Assistant

## 2021-09-16 DIAGNOSIS — R413 Other amnesia: Secondary | ICD-10-CM | POA: Insufficient documentation

## 2021-09-16 DIAGNOSIS — Z85118 Personal history of other malignant neoplasm of bronchus and lung: Secondary | ICD-10-CM | POA: Diagnosis present

## 2021-09-16 DIAGNOSIS — R42 Dizziness and giddiness: Secondary | ICD-10-CM | POA: Insufficient documentation

## 2022-03-14 ENCOUNTER — Emergency Department: Payer: 59

## 2022-03-14 ENCOUNTER — Other Ambulatory Visit: Payer: Self-pay

## 2022-03-14 DIAGNOSIS — Z85118 Personal history of other malignant neoplasm of bronchus and lung: Secondary | ICD-10-CM | POA: Diagnosis not present

## 2022-03-14 DIAGNOSIS — I129 Hypertensive chronic kidney disease with stage 1 through stage 4 chronic kidney disease, or unspecified chronic kidney disease: Secondary | ICD-10-CM | POA: Diagnosis not present

## 2022-03-14 DIAGNOSIS — Z7982 Long term (current) use of aspirin: Secondary | ICD-10-CM | POA: Insufficient documentation

## 2022-03-14 DIAGNOSIS — N189 Chronic kidney disease, unspecified: Secondary | ICD-10-CM | POA: Insufficient documentation

## 2022-03-14 DIAGNOSIS — Z79899 Other long term (current) drug therapy: Secondary | ICD-10-CM | POA: Diagnosis not present

## 2022-03-14 DIAGNOSIS — W010XXA Fall on same level from slipping, tripping and stumbling without subsequent striking against object, initial encounter: Secondary | ICD-10-CM | POA: Insufficient documentation

## 2022-03-14 DIAGNOSIS — M25551 Pain in right hip: Secondary | ICD-10-CM | POA: Insufficient documentation

## 2022-03-14 NOTE — ED Triage Notes (Signed)
Pt to ED from home. Pt fell at the bank about 2 days ago. 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 Pt advised his pain has gotten worse gradually. Pt denies LOC at that time. Pt is now complaining of right sided hip pain. Pt is ambulatory with cane.

## 2022-03-15 ENCOUNTER — Emergency Department
Admission: EM | Admit: 2022-03-15 | Discharge: 2022-03-15 | Disposition: A | Discharge status: Home / Self Care | Payer: 59 | Attending: Emergency Medicine | Admitting: Emergency Medicine

## 2022-03-15 DIAGNOSIS — M25551 Pain in right hip: Secondary | ICD-10-CM

## 2022-03-15 DIAGNOSIS — W19XXXA Unspecified fall, initial encounter: Secondary | ICD-10-CM

## 2022-03-15 MED ORDER — ACETAMINOPHEN 500 MG PO TABS
1000.0000 mg | ORAL_TABLET | Freq: Once | ORAL | Status: AC
  Administered 2022-03-15: 1000 mg via ORAL
  Filled 2022-03-15: qty 2

## 2022-03-15 NOTE — ED Notes (Signed)
ED Provider at bedside. 

## 2022-03-15 NOTE — ED Provider Notes (Signed)
[facility] Provider Note    Event Date/Time   First MD Initiated Contact with Patient 03/15/22 0106     (approximate)   History   Fall and Hip Pain (Right)   HPI  Tayo Maute is a 76 y.o. male with history of hypertension, hyperlipidemia, chronic kidney disease, stage I lung cancer who presents to the emergency department with right hip pain.  States that he tripped and fell onto his right hip.  States he just fell onto his bottom and did not hit his head or lose consciousness.  He is able to ambulate with a cane.  No chest pain, shortness of breath.  No fevers, cough.  No vomiting or diarrhea.  No numbness or weakness.   History provided by patient and family.    Past Medical History:  Diagnosis Date   Cancer ([place])    Chronic kidney disease    Dysphagia    GERD (gastroesophageal reflux disease)    History of BPH    Hyperlipidemia    Hypertension    Myocardial infarction [facility])    Peripheral vascular disease ([place])     Past Surgical History:  Procedure Laterality Date   APPENDECTOMY     COLONOSCOPY WITH PROPOFOL N/A 09/16/2016   Procedure: COLONOSCOPY WITH PROPOFOL;  Surgeon: [name], MD;  Location: [facility] ENDOSCOPY;  Service: Endoscopy;  Laterality: N/A;   COLONOSCOPY WITH PROPOFOL N/A 11/07/2019   Procedure: COLONOSCOPY WITH PROPOFOL;  Surgeon: Toledo, Benay Pike, MD;  Location: [facility] ENDOSCOPY;  Service: Gastroenterology;  Laterality: N/A;   ESOPHAGOGASTRODUODENOSCOPY (EGD) WITH PROPOFOL N/A 09/16/2016   Procedure: ESOPHAGOGASTRODUODENOSCOPY (EGD) WITH PROPOFOL;  Surgeon: [name], MD;  [facility] ENDOSCOPY;  Service: Endoscopy;  Laterality: N/A;   ESOPHAGOGASTRODUODENOSCOPY (EGD) WITH PROPOFOL N/A 12/16/2016   Procedure: ESOPHAGOGASTRODUODENOSCOPY (EGD) WITH PROPOFOL;  Surgeon: [name], MD;  Location: [facility] ENDOSCOPY;  Service: Endoscopy;  Laterality: N/A;   STOMACH SURGERY      MEDICATIONS:  Prior  to Admission medications   Medication Sig Start Date End Date Taking? Authorizing Provider  amLODipine (NORVASC) 10 MG tablet TAKE 1 TABLET BY MOUTH ONCE DAILY 05/22/18   Cammie Sickle, MD  aspirin EC 81 MG tablet Take 81 mg by mouth daily.    [provider]  atorvastatin (LIPITOR) 10 MG tablet Take 10 mg by mouth daily.    [provider]  hydrochlorothiazide (HYDRODIURIL) 25 MG tablet Take 1 tablet (25 mg total) by mouth daily. 06/23/18   Cammie Sickle, MD  pantoprazole (PROTONIX) 40 MG tablet Take 1 tablet by mouth daily. 05/19/17   [provider]  potassium chloride SA (KLOR-CON) 20 MEQ tablet Take 1 tablet (20 mEq total) by mouth daily for 3 days. 08/16/19 08/19/21  Lequita Asal, MD  vitamin B-12 (CYANOCOBALAMIN) 1000 MCG tablet Take 1,000 mcg by mouth daily.    [provider]    Physical Exam   Triage Vital Signs: ED Triage Vitals  Enc Vitals Group     BP 03/14/22 2248 130/60     Pulse Rate 03/14/22 2248 88     Resp 03/14/22 2248 16     Temp 03/14/22 2248 100 F (37.8 C)     Temp Source 03/14/22 2248 Oral     SpO2 03/14/22 2248 95 %     Weight 03/14/22 2249 220 lb (99.8 kg)     Height 03/14/22 2249 5\' 10"  (1.778 m)     Head Circumference --  Peak Flow --      Pain Score 03/14/22 2248 8     Pain Loc --      Pain Edu? --      Excl. in [place]? --     Most recent vital signs: Vitals:   03/14/22 2248 03/15/22 0124  BP: 130/60   Pulse: 88   Resp: 16   Temp: 100 F (37.8 C) 99.1 F (37.3 C)  SpO2: 95%      CONSTITUTIONAL: Alert and oriented and responds appropriately to questions. Well-appearing; well-nourished; GCS 15 HEAD: Normocephalic; atraumatic EYES: Conjunctivae clear, PERRL, EOMI ENT: normal nose; no rhinorrhea; moist mucous membranes; pharynx without lesions noted; no dental injury; no septal hematoma, no epistaxis; no facial deformity or bony tenderness NECK: Supple, no midline spinal tenderness,  step-off or deformity; trachea midline CARD: RRR; S1 and S2 appreciated; no murmurs, no clicks, no rubs, no gallops RESP: Normal chest excursion without splinting or tachypnea; breath sounds clear and equal bilaterally; no wheezes, no rhonchi, no rales; no hypoxia or respiratory distress CHEST:  chest wall stable, no crepitus or ecchymosis or deformity, nontender to palpation; no flail chest ABD/GI: Normal bowel sounds; non-distended; soft, non-tender, no rebound, no guarding; no ecchymosis or other lesions noted PELVIS:  stable, nontender to palpation, no leg length discrepancy BACK:  The back appears normal; no midline spinal tenderness, step-off or deformity EXT: Normal ROM in all joints; non-tender to palpation; no edema; normal capillary refill; no cyanosis, no bony tenderness or bony deformity of patient's extremities, no joint effusion, compartments are soft, extremities are warm and well-perfused, no ecchymosis, patient has full range of motion in the right hip but I am able to flex it, internally and externally rotate it without difficulty.  He has a 2+ right DP pulse.  SKIN: Normal color for age and race; warm NEURO: No facial asymmetry, normal speech, moving all extremities equally  ED Results / Procedures / Treatments   LABS: (all labs ordered are listed, but only abnormal results are displayed) Labs Reviewed - No data to display   EKG:   RADIOLOGY: My personal review and interpretation of imaging: X-ray shows no fracture.  I have personally reviewed all radiology reports. DG Hip Unilat  With Pelvis 2-3 Views Right  Result Date: 03/14/2022 CLINICAL DATA:  Fall.  Patient fell at the bank about 2 days ago. EXAM: DG HIP (WITH OR WITHOUT PELVIS) 2-3V RIGHT COMPARISON:  None Available. FINDINGS: There is no evidence of hip fracture or dislocation. Right hip joint space narrowing with acetabular lip spurring. Sacroiliac joints and pubic symphysis are intact. Evaluation of sacrum is  somewhat limited due to overlying bowel contents. Vascular calcifications. IMPRESSION: 1. No acute fracture or dislocation. If there is persistent right hip pain and patient is unable to bear weight further evaluation with MR examination is suggested. 2. Mild right hip osteoarthritis. Electronically Signed   By: Keane Police D.O.   On: 03/14/2022 23:30     PROCEDURES:  Critical Care performed: No      Procedures    IMPRESSION / MDM / ASSESSMENT AND PLAN / ED COURSE  I reviewed the triage vital signs and the nursing notes.  Patient here with mechanical fall with right hip pain.  Is able to ambulate.     DIFFERENTIAL DIAGNOSIS (includes but not limited to):   Contusion, sprain, fracture, doubt dislocation  Patient's presentation is most consistent with acute complicated illness / injury requiring diagnostic workup.  PLAN: X-rays obtained and reviewed  and interpreted by myself and the radiologist and show no acute abnormality.  Patient is able to bear weight and is neurovascular intact distally.  No other signs of injury.  Given Tylenol here for pain control and able to ambulate in the ED.  Denies any other injury including head injury.  I do not feel he needs further emergent workup, imaging at this time.  Will give outpatient orthopedic follow-up if symptoms or not improving for further management.  Recommended over-the-counter Tylenol as needed for pain control.   MEDICATIONS GIVEN IN ED: Medications  acetaminophen (TYLENOL) tablet 1,000 mg (1,000 mg Oral Given 03/15/22 0129)     ED COURSE:  At this time, I do not feel there is any life-threatening condition present. I reviewed all nursing notes, vitals, pertinent previous records.  All lab and urine results, EKGs, imaging ordered have been independently reviewed and interpreted by myself.  I reviewed all available radiology reports from any imaging ordered this visit.  Based on my assessment, I feel the patient is safe to be  discharged home without further emergent workup and can continue workup as an outpatient as needed. Discussed all findings, treatment plan as well as usual and customary return precautions.  They verbalize understanding and are comfortable with this plan.  Outpatient follow-up has been provided as needed.  All questions have been answered.    CONSULTS:  none   OUTSIDE RECORDS REVIEWED: Reviewed patient's last oncology and radiation oncology notes in May 2023.       FINAL CLINICAL IMPRESSION(S) / ED DIAGNOSES   Final diagnoses:  Fall, initial encounter  Right hip pain     Rx / DC Orders   ED Discharge Orders     None        Note:  This document was prepared using Dragon voice recognition software and may include unintentional dictation errors.   Baylen Buckner, Delice [place], DO 03/15/22 [phone]

## 2022-03-15 NOTE — ED Notes (Signed)
Pt brought to hallway 5 bed, this RN now assuming care.

## 2022-03-15 NOTE — Discharge Instructions (Signed)
He may take Tylenol 1000 mg every 6 hours as needed for pain.  This medication is found over-the-counter.  Your x-ray showed no fracture today.  If you continue to have pain in your hip, I recommend follow-up with Dr. Posey Pronto with orthopedics for further evaluation and possible MRI.

## 2022-08-19 ENCOUNTER — Ambulatory Visit
Admission: RE | Admit: 2022-08-19 | Discharge: 2022-08-19 | Disposition: A | Discharge status: Home / Self Care | Payer: 59 | Source: Ambulatory Visit | Attending: Oncology | Admitting: Oncology

## 2022-08-19 ENCOUNTER — Inpatient Hospital Stay: Payer: 59 | Attending: Radiation Oncology

## 2022-08-19 DIAGNOSIS — Z87891 Personal history of nicotine dependence: Secondary | ICD-10-CM | POA: Insufficient documentation

## 2022-08-19 DIAGNOSIS — Z08 Encounter for follow-up examination after completed treatment for malignant neoplasm: Secondary | ICD-10-CM | POA: Insufficient documentation

## 2022-08-19 DIAGNOSIS — Z923 Personal history of irradiation: Secondary | ICD-10-CM | POA: Insufficient documentation

## 2022-08-19 DIAGNOSIS — Z85118 Personal history of other malignant neoplasm of bronchus and lung: Secondary | ICD-10-CM | POA: Diagnosis present

## 2022-08-19 DIAGNOSIS — C3411 Malignant neoplasm of upper lobe, right bronchus or lung: Secondary | ICD-10-CM | POA: Insufficient documentation

## 2022-08-20 ENCOUNTER — Other Ambulatory Visit: Payer: Self-pay

## 2022-08-20 DIAGNOSIS — E538 Deficiency of other specified B group vitamins: Secondary | ICD-10-CM

## 2022-08-20 DIAGNOSIS — D72819 Decreased white blood cell count, unspecified: Secondary | ICD-10-CM

## 2022-08-24 ENCOUNTER — Inpatient Hospital Stay: Payer: 59

## 2022-08-24 ENCOUNTER — Inpatient Hospital Stay (HOSPITAL_BASED_OUTPATIENT_CLINIC_OR_DEPARTMENT_OTHER): Payer: 59 | Admitting: Oncology

## 2022-08-24 ENCOUNTER — Encounter: Payer: Self-pay | Admitting: Oncology

## 2022-08-24 VITALS — BP 128/70 | HR 64 | Temp 94.9°F | Resp 18 | Ht 70.0 in | Wt 183.2 lb

## 2022-08-24 DIAGNOSIS — Z85118 Personal history of other malignant neoplasm of bronchus and lung: Secondary | ICD-10-CM

## 2022-08-24 DIAGNOSIS — Z08 Encounter for follow-up examination after completed treatment for malignant neoplasm: Secondary | ICD-10-CM | POA: Diagnosis not present

## 2022-08-24 DIAGNOSIS — Z87891 Personal history of nicotine dependence: Secondary | ICD-10-CM | POA: Diagnosis not present

## 2022-08-24 DIAGNOSIS — D72819 Decreased white blood cell count, unspecified: Secondary | ICD-10-CM

## 2022-08-24 DIAGNOSIS — E538 Deficiency of other specified B group vitamins: Secondary | ICD-10-CM

## 2022-08-24 DIAGNOSIS — Z923 Personal history of irradiation: Secondary | ICD-10-CM | POA: Diagnosis not present

## 2022-08-24 DIAGNOSIS — C3411 Malignant neoplasm of upper lobe, right bronchus or lung: Secondary | ICD-10-CM | POA: Diagnosis present

## 2022-08-24 LAB — CBC WITH DIFFERENTIAL (CANCER CENTER ONLY)
Abs Immature Granulocytes: 0.02 10*3/uL (ref 0.00–0.07)
Basophils Absolute: 0 10*3/uL (ref 0.0–0.1)
Basophils Relative: 1 %
Eosinophils Absolute: 0.1 10*3/uL (ref 0.0–0.5)
Eosinophils Relative: 3 %
HCT: 39.1 % (ref 39.0–52.0)
Hemoglobin: 12.6 g/dL — ABNORMAL LOW (ref 13.0–17.0)
Immature Granulocytes: 0 %
Lymphocytes Relative: 37 %
Lymphs Abs: 1.9 10*3/uL (ref 0.7–4.0)
MCH: 27.8 pg (ref 26.0–34.0)
MCHC: 32.2 g/dL (ref 30.0–36.0)
MCV: 86.1 fL (ref 80.0–100.0)
Monocytes Absolute: 0.8 10*3/uL (ref 0.1–1.0)
Monocytes Relative: 15 %
Neutro Abs: 2.3 10*3/uL (ref 1.7–7.7)
Neutrophils Relative %: 44 %
Platelet Count: 213 10*3/uL (ref 150–400)
RBC: 4.54 MIL/uL (ref 4.22–5.81)
RDW: 14.4 % (ref 11.5–15.5)
WBC Count: 5.2 10*3/uL (ref 4.0–10.5)
nRBC: 0 % (ref 0.0–0.2)

## 2022-08-24 LAB — CMP (CANCER CENTER ONLY)
ALT: 14 U/L (ref 0–44)
AST: 23 U/L (ref 15–41)
Albumin: 3.9 g/dL (ref 3.5–5.0)
Alkaline Phosphatase: 55 U/L (ref 38–126)
Anion gap: 10 (ref 5–15)
BUN: 28 mg/dL — ABNORMAL HIGH (ref 8–23)
CO2: 27 mmol/L (ref 22–32)
Calcium: 9.1 mg/dL (ref 8.9–10.3)
Chloride: 97 mmol/L — ABNORMAL LOW (ref 98–111)
Creatinine: 1.22 mg/dL (ref 0.61–1.24)
GFR, Estimated: >60 mL/min (ref >60)
Glucose, Bld: 104 mg/dL — ABNORMAL HIGH (ref 70–99)
Potassium: 3.9 mmol/L (ref 3.5–5.1)
Sodium: 134 mmol/L — ABNORMAL LOW (ref 135–145)
Total Bilirubin: 0.6 mg/dL (ref 0.3–1.2)
Total Protein: 7.9 g/dL (ref 6.5–8.1)

## 2022-08-24 LAB — VITAMIN B12: Vitamin B-12: 840 pg/mL (ref 180–914)

## 2022-08-24 NOTE — Progress Notes (Signed)
Hematology/Oncology Consult note [facility]  [phone] [phone]  Patient Care Team: Gracelyn Nurse, MD as PCP - General (Internal Medicine) Glory Buff, RN as Registered Nurse Donneta Romberg, Worthy Flank, MD as Consulting Physician (Internal Medicine) Hulda Marin, MD (Inactive) as Referring Physician (Cardiothoracic Surgery) Carmina Miller, MD as Referring Physician (Radiation Oncology) Rosey Bath, MD (Inactive) as Referring Physician (Hematology and Oncology)   Name of the patient: Ruben Diaz  [id number]  24-Jun-1945   Date of visit: 08/24/22  Diagnosis- stage Ia adenocarcinoma of the right upper lobe of the lung s/p radiation   Chief complaint/ Reason for visit-routine follow-up of lung cancer  Heme/Onc history: Ruben Diaz is a 77 y.o. male with stage IA RUL lung cancer s/p CT guided biopsy on 06/01/2017.  Pathology revealed adenocarcinoma, acinar and solid pattern.   PET scan on 05/04/2017 revealed a markedly hypermetabolic 19 mm right upper lobe pulmonary lesion c/w primary lung neoplasm. There were no enlarged or hypermetabolic mediastinal/hilar lymph nodes.  There was a single (poorly visualized on CT) hypermetabolic hepatic lesion near the gallbladder fossa.   Liver MRI on 05/12/2017 revealed no evidence of metastatic disease.   He declined surgery.  He received 4 cycles of carboplatin and Alimta (06/14/2017 - 08/16/2017).  He received SBRT from 11/01/2017 - 11/13/2017.  He received 6000 cGy in 5 fractions.   Patient has remained in remission since then  Interval history-patient is doing well for his age.  Denies any significant cough or shortness of breath.  ECOG PS- 1 Pain scale- 0   Review of systems- Review of Systems  Constitutional:  Negative for chills, fever, malaise/fatigue and weight loss.  HENT:  Negative for congestion, ear discharge and nosebleeds.   Eyes:  Negative for blurred  vision.  Respiratory:  Negative for cough, hemoptysis, sputum production, shortness of breath and wheezing.   Cardiovascular:  Negative for chest pain, palpitations, orthopnea and claudication.  Gastrointestinal:  Negative for abdominal pain, blood in stool, constipation, diarrhea, heartburn, melena, nausea and vomiting.  Genitourinary:  Negative for dysuria, flank pain, frequency, hematuria and urgency.  Musculoskeletal:  Negative for back pain, joint pain and myalgias.  Skin:  Negative for rash.  Neurological:  Negative for dizziness, tingling, focal weakness, seizures, weakness and headaches.  Endo/Heme/Allergies:  Does not bruise/bleed easily.  Psychiatric/Behavioral:  Negative for depression and suicidal ideas. The patient does not have insomnia.       Allergies  Allergen Reactions   Sucralfate Other (See Comments)    dizziness     Past Medical History:  Diagnosis Date   Cancer ([facility])    Chronic kidney disease    Dysphagia    GERD (gastroesophageal reflux disease)    History of BPH    Hyperlipidemia    Hypertension    Myocardial infarction [facility])    Peripheral vascular disease ([facility])      Past Surgical History:  Procedure Laterality Date   APPENDECTOMY     COLONOSCOPY WITH PROPOFOL N/A 09/16/2016   Procedure: COLONOSCOPY WITH PROPOFOL;  Surgeon: Christena Deem, MD;  Location: [facility] ENDOSCOPY;  Service: Endoscopy;  Laterality: N/A;   COLONOSCOPY WITH PROPOFOL N/A 11/07/2019   Procedure: COLONOSCOPY WITH PROPOFOL;  Surgeon: Toledo, Boykin Nearing, MD;  Location: [facility] ENDOSCOPY;  Service: Gastroenterology;  Laterality: N/A;   ESOPHAGOGASTRODUODENOSCOPY (EGD) WITH PROPOFOL N/A 09/16/2016   Procedure: ESOPHAGOGASTRODUODENOSCOPY (EGD) WITH PROPOFOL;  Surgeon: Christena Deem, MD;  Location: [facility] ENDOSCOPY;  Service: Endoscopy;  Laterality:  N/A;   ESOPHAGOGASTRODUODENOSCOPY (EGD) WITH PROPOFOL N/A 12/16/2016   Procedure: ESOPHAGOGASTRODUODENOSCOPY (EGD) WITH PROPOFOL;  Surgeon:  Christena Deem, MD;  Location: [facility] ENDOSCOPY;  Service: Endoscopy;  Laterality: N/A;   STOMACH SURGERY      Social History   Socioeconomic History   Marital status: Single    Spouse name: Not on file   Number of children: Not on file   Years of education: Not on file   Highest education level: Not on file  Occupational History   Not on file  Tobacco Use   Smoking status: Former    Packs/day: 0.70    Years: 51.00    Additional pack years: 0.00    Total pack years: 35.70    Types: Cigarettes    Quit date: 2013    Years since quitting: 11.4   Smokeless tobacco: Never  Vaping Use   Vaping Use: Never used  Substance and Sexual Activity   Alcohol use: No   Drug use: No   Sexual activity: Yes  Other Topics Concern   Not on file  Social History Narrative   Not on file   Social Determinants of Health   Financial Resource Strain: Not on file  Food Insecurity: Not on file  Transportation Needs: No Transportation Needs (08/19/2021)   PRAPARE - Administrator, Civil Service (Medical): No    Lack of Transportation (Non-Medical): No  Physical Activity: Not on file  Stress: Not on file  Social Connections: Not on file  Intimate Partner Violence: Not on file    History reviewed. No pertinent family history.   Current Outpatient Medications:    amLODipine (NORVASC) 10 MG tablet, TAKE 1 TABLET BY MOUTH ONCE DAILY, Disp: 90 tablet, Rfl: 0   aspirin EC 81 MG tablet, Take 81 mg by mouth daily., Disp: , Rfl:    atorvastatin (LIPITOR) 10 MG tablet, Take 10 mg by mouth daily., Disp: , Rfl:    hydrochlorothiazide (HYDRODIURIL) 25 MG tablet, Take 1 tablet (25 mg total) by mouth daily., Disp: 30 tablet, Rfl: 3   pantoprazole (PROTONIX) 40 MG tablet, Take 1 tablet by mouth daily., Disp: , Rfl:    vitamin B-12 (CYANOCOBALAMIN) 1000 MCG tablet, Take 1,000 mcg by mouth daily., Disp: , Rfl:    potassium chloride SA (KLOR-CON) 20 MEQ tablet, Take 1 tablet (20 mEq total) by  mouth daily for 3 days., Disp: 3 tablet, Rfl: 0  Physical exam:  Vitals:   08/24/22 1134  BP: 128/70  Pulse: 64  Resp: 18  Temp: (!) 94.9 F (34.9 C)  TempSrc: Tympanic  SpO2: 100%  Weight: 183 lb 3.2 oz (83.1 kg)  Height: 5\' 10"  (1.778 m)   Physical Exam Cardiovascular:     Rate and Rhythm: Normal rate and regular rhythm.     Heart sounds: Normal heart sounds.  Pulmonary:     Effort: Pulmonary effort is normal.     Breath sounds: Normal breath sounds.  Abdominal:     General: Bowel sounds are normal.     Palpations: Abdomen is soft.  Skin:    General: Skin is warm and dry.  Neurological:     Mental Status: He is alert and oriented to person, place, and time.         Latest Ref Rng & Units 08/24/2022   11:23 AM  CMP  Glucose 70 - 99 mg/dL 161   BUN 8 - 23 mg/dL 28   Creatinine 0.96 - 1.24 mg/dL 0.45  Sodium 135 - 145 mmol/L 134   Potassium 3.5 - 5.1 mmol/L 3.9   Chloride 98 - 111 mmol/L 97   CO2 22 - 32 mmol/L 27   Calcium 8.9 - 10.3 mg/dL 9.1   Total Protein 6.5 - 8.1 g/dL 7.9   Total Bilirubin 0.3 - 1.2 mg/dL 0.6   Alkaline Phos 38 - 126 U/L 55   AST 15 - 41 U/L 23   ALT 0 - 44 U/L 14       Latest Ref Rng & Units 08/24/2022   11:23 AM  CBC  WBC 4.0 - 10.5 K/uL 5.2   Hemoglobin 13.0 - 17.0 g/dL 40.9   Hematocrit 81.1 - 52.0 % 39.1   Platelets 150 - 400 K/uL 213      Assessment and plan- Patient is a 77 y.o. male with a history of stage I adenocarcinoma of the right upper lobe in 2019 s/p chemotherapy and radiation.  He is here for routine surveillanceVisit  Patient had a CT scan on 08/19/2022 the results of which are not officially reported.  I have reviewed the images independently and discussed findings with the patient which does not show any evidence of recurrent or progressive disease.  He has had stable radiation changes in the right upper lobe.  I will see him back in 1 year with repeat scan   Visit Diagnosis 1. Encounter for follow-up  surveillance of lung cancer      Dr. Owens Shark, MD, MPH [facility] at [facility] [phone] 08/24/2022 1:11 PM

## 2023-08-15 ENCOUNTER — Telehealth: Payer: Self-pay

## 2023-08-15 NOTE — Telephone Encounter (Signed)
 Discussed future appointments and transportation with the patient. Patient acknowledged that he knows to use his insurance-provider for transportation for travel to and from appointments.

## 2023-08-24 ENCOUNTER — Ambulatory Visit
Admission: RE | Admit: 2023-08-24 | Discharge: 2023-08-24 | Disposition: A | Discharge status: Home / Self Care | Payer: 59 | Source: Ambulatory Visit | Attending: Oncology | Admitting: Oncology

## 2023-08-24 DIAGNOSIS — Z85118 Personal history of other malignant neoplasm of bronchus and lung: Secondary | ICD-10-CM | POA: Diagnosis present

## 2023-08-24 DIAGNOSIS — Z08 Encounter for follow-up examination after completed treatment for malignant neoplasm: Secondary | ICD-10-CM | POA: Insufficient documentation

## 2023-08-26 ENCOUNTER — Inpatient Hospital Stay: Payer: 59 | Attending: Oncology | Admitting: Oncology

## 2023-08-26 ENCOUNTER — Encounter: Payer: Self-pay | Admitting: Oncology

## 2023-08-26 VITALS — BP 136/68 | HR 63 | Temp 98.6°F | Resp 20 | Wt 179.1 lb

## 2023-08-26 DIAGNOSIS — Z9221 Personal history of antineoplastic chemotherapy: Secondary | ICD-10-CM | POA: Diagnosis not present

## 2023-08-26 DIAGNOSIS — Z85118 Personal history of other malignant neoplasm of bronchus and lung: Secondary | ICD-10-CM | POA: Diagnosis present

## 2023-08-26 DIAGNOSIS — Z923 Personal history of irradiation: Secondary | ICD-10-CM | POA: Insufficient documentation

## 2023-08-26 DIAGNOSIS — Z87891 Personal history of nicotine dependence: Secondary | ICD-10-CM | POA: Diagnosis not present

## 2023-08-26 DIAGNOSIS — Z08 Encounter for follow-up examination after completed treatment for malignant neoplasm: Secondary | ICD-10-CM

## 2023-08-26 NOTE — Progress Notes (Signed)
 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 male with stage IA RUL lung cancer s/p CT guided biopsy on 06/01/2017.  Pathology revealed adenocarcinoma, acinar and solid pattern.   PET scan on 05/04/2017 revealed a markedly hypermetabolic 19 mm right upper lobe pulmonary lesion c/w primary lung neoplasm. There were no enlarged or hypermetabolic mediastinal/hilar lymph nodes.  There was a single (poorly visualized on CT) hypermetabolic hepatic lesion near the gallbladder fossa.   Liver MRI on 05/12/2017 revealed no evidence of metastatic disease.   He declined surgery.  He received 4 cycles of carboplatin  and Alimta  (06/14/2017 - 08/16/2017).  He received SBRT from 11/01/2017 - 11/13/2017.  He received 6000 cGy in 5 fractions.   Patient has remained in remission since then    Interval history-patient is currently doing well for his age.  Denies any cough, changes in his appetite or weight.  He has baseline exertional shortness of breath which has remained stable  ECOG PS- 1 Pain scale- 0   Review of systems- Review of Systems  Constitutional:  Negative for chills, fever, malaise/fatigue and weight loss.  HENT:  Negative for  congestion, ear discharge and nosebleeds.   Eyes:  Negative for blurred vision.  Respiratory:  Negative for cough, hemoptysis, sputum production, shortness of breath and wheezing.   Cardiovascular:  Negative for chest pain, palpitations, orthopnea and claudication.  Gastrointestinal:  Negative for abdominal pain, blood in stool, constipation, diarrhea, heartburn, melena, nausea and vomiting.  Genitourinary:  Negative for dysuria, flank pain, frequency, hematuria and urgency.  Musculoskeletal:  Negative for back pain, joint pain and myalgias.  Skin:  Negative for rash.  Neurological:  Negative for dizziness, tingling, focal weakness, seizures, weakness and headaches.  Endo/Heme/Allergies:  Does not bruise/bleed easily.  Psychiatric/Behavioral:  Negative for depression and suicidal ideas. The patient does not have insomnia.       Allergies  Allergen Reactions   Sucralfate Other (See Comments)    dizziness     Past Medical History:  Diagnosis Date   Cancer (HCC)    Chronic kidney disease    Dysphagia    GERD (gastroesophageal reflux disease)    History of BPH    Hyperlipidemia    Hypertension    Myocardial infarction [facility])    Peripheral vascular disease (HCC)      Past Surgical History:  Procedure Laterality Date   APPENDECTOMY     COLONOSCOPY WITH PROPOFOL  N/A 09/16/2016   Procedure: COLONOSCOPY WITH PROPOFOL ;  Surgeon: Deveron Fly, MD;  Location: [facility] ENDOSCOPY;  Service: Endoscopy;  Laterality: N/A;   COLONOSCOPY WITH PROPOFOL  N/A 11/07/2019   Procedure: COLONOSCOPY WITH PROPOFOL ;  Surgeon: Toledo, Alphonsus Jeans, MD;  Location: [facility] ENDOSCOPY;  Service: Gastroenterology;  Laterality: N/A;   ESOPHAGOGASTRODUODENOSCOPY (EGD) WITH PROPOFOL  N/A 09/16/2016   Procedure: ESOPHAGOGASTRODUODENOSCOPY (EGD)  WITH PROPOFOL ;  Surgeon: Deveron Fly, MD;  Location: [facility] ENDOSCOPY;  Service: Endoscopy;  Laterality: N/A;   ESOPHAGOGASTRODUODENOSCOPY (EGD) WITH PROPOFOL  N/A 12/16/2016    Procedure: ESOPHAGOGASTRODUODENOSCOPY (EGD) WITH PROPOFOL ;  Surgeon: Deveron Fly, MD;  Location: [facility] ENDOSCOPY;  Service: Endoscopy;  Laterality: N/A;   STOMACH SURGERY      Social History   Socioeconomic History   Marital status: Single    Spouse name: Not on file   Number of children: Not on file   Years of education: Not on file   Highest education level: Not on file  Occupational History   Not on file  Tobacco Use   Smoking status: Former    Current packs/day: 0.00    Average packs/day: 0.7 packs/day for 51.0 years (35.7 ttl pk-yrs)    Types: Cigarettes    Start date: 76    Quit date: 2013    Years since quitting: 12.4   Smokeless tobacco: Never  Vaping Use   Vaping status: Never Used  Substance and Sexual Activity   Alcohol use: No   Drug use: No   Sexual activity: Yes  Other Topics Concern   Not on file  Social History Narrative   Not on file   Social Drivers of Health   Financial Resource Strain: Low Risk  (11/30/2022)   Received [facility]   Overall Financial Resource Strain (CARDIA)    Difficulty of Paying Living Expenses: Not hard at all  Food Insecurity: Food Insecurity Present (11/30/2022)   Received [facility]   Hunger Vital Sign    Worried About Running Out of Food in the Last Year: Sometimes true    Ran Out of Food in the Last Year: Never true  Transportation Needs: No Transportation Needs (11/30/2022)   Received from [facility]   PRAPARE - Transportation    Lack of Transportation (Non-Medical): No    In the past 12 months, has lack of transportation kept you from medical appointments or from getting medications?: No  Physical Activity: Not on file  Stress: Not on file  Social Connections: Not on file  Intimate Partner Violence: Not on file    No family history on file.   Current Outpatient Medications:    amLODipine  (NORVASC ) 10 MG tablet, TAKE 1 TABLET BY MOUTH ONCE  DAILY, Disp: 90 tablet, Rfl: 0   aspirin EC 81 MG tablet, Take 81 mg by mouth daily., Disp: , Rfl:    atorvastatin (LIPITOR) 10 MG tablet, Take 10 mg by mouth daily., Disp: , Rfl:    hydrochlorothiazide  (HYDRODIURIL ) 25 MG tablet, Take 1 tablet (25 mg total) by mouth daily., Disp: 30 tablet, Rfl: 3   pantoprazole (PROTONIX) 40 MG tablet, Take 1 tablet by mouth daily., Disp: , Rfl:    potassium chloride  SA (KLOR-CON ) 20 MEQ tablet, Take 1 tablet (20 mEq total) by mouth daily for 3 days., Disp: 3 tablet, Rfl: 0   vitamin B-12 (CYANOCOBALAMIN ) 1000 MCG tablet, Take 1,000 mcg by mouth daily., Disp: , Rfl:   Physical exam: There were no vitals filed for this visit. Physical Exam Cardiovascular:     Rate and Rhythm: Normal rate and regular rhythm.     Heart sounds: Normal heart sounds.  Pulmonary:     Effort: Pulmonary effort is normal.     Breath sounds: Normal breath sounds.  Skin:    General: Skin is warm and dry.  Neurological:     Mental  Status: He is alert and oriented to person, place, and time.      I have personally reviewed labs listed below:    Latest Ref Rng & Units 08/24/2022   11:23 AM  CMP  Glucose 70 - 99 mg/dL 562   BUN 8 - 23 mg/dL 28   Creatinine 1.30 - 1.24 mg/dL 8.65   Sodium 784 - 696 mmol/L 134   Potassium 3.5 - 5.1 mmol/L 3.9   Chloride 98 - 111 mmol/L 97   CO2 22 - 32 mmol/L 27   Calcium 8.9 - 10.3 mg/dL 9.1   Total Protein 6.5 - 8.1 g/dL 7.9   Total Bilirubin 0.3 - 1.2 mg/dL 0.6   Alkaline Phos 38 - 126 U/L 55   AST 15 - 41 U/L 23   ALT 0 - 44 U/L 14       Latest Ref Rng & Units 08/24/2022   11:23 AM  CBC  WBC 4.0 - 10.5 K/uL 5.2   Hemoglobin 13.0 - 17.0 g/dL 29.5   Hematocrit 28.4 - 52.0 % 39.1   Platelets 150 - 400 K/uL 213    I have personally reviewed Radiology images listed below: No images are attached to the encounter.  CT Chest Wo Contrast Result Date: 08/26/2023 CLINICAL DATA:  Lung cancer surveillance CT. Re-stage 1A adenocarcinoma  right upper lobe status post radiation. EXAM: CT CHEST WITHOUT CONTRAST TECHNIQUE: Multidetector CT imaging of the chest was performed following the standard protocol without IV contrast. RADIATION DOSE REDUCTION: This exam was performed according to the departmental dose-optimization program which includes automated exposure control, adjustment of the mA and/or kV according to patient size and/or use of iterative reconstruction technique. COMPARISON:  Chest CTs without contrast 08/19/2022, 08/13/2021 and 08/12/2020. FINDINGS: Cardiovascular: The coronary arteries are heavily calcified. The cardiac size is normal. There are moderate patchy calcifications in the aorta and great vessels. There is no aortic aneurysm. The pulmonary arteries and veins are normal in caliber. No pericardial effusion. Mediastinum/Nodes: No adenopathy is seen without contrast. Limited view of the hila. Stable multifocal small subcentimeter mediastinal nodes. Negative thyroid , axilla, trachea, esophagus. 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 There is diffuse bronchial thickening. Stable 3.4 x 1.3 cm pleural-based treated neoplasm is again noted anteriorly in the right upper lobe apex, without change in contour or volume. The stable 5 mm subpleural left lower lobe nodule on 4:107 is again noted, a stable 6 mm sessile pleural based left lower lobe nodule on 4:100. The lungs are otherwise clear. No pleural effusion, thickening or pneumothorax. Upper Abdomen: Stable scattered hepatic cysts. No acute upper abdominal findings. Abdominal aortic atherosclerosis. Musculoskeletal: There is bridging enthesopathy mid to lower thoracic spine. No acute or other significant osseous findings. Unremarkable chest wall. Asymmetric right shoulder  DJD. IMPRESSION: 1. Stable 3.4 x 1.3 cm pleural-based treated neoplasm anteriorly in the right upper lobe apex. 2. Stable 5 mm subpleural left lower lobe nodule and 6 mm sessile pleural based left lower lobe nodule. 3. Emphysema and diffuse bronchial thickening. 4. Aortic and coronary artery atherosclerosis. Aortic Atherosclerosis (ICD10-I70.0) and Emphysema (ICD10-J43.9). Electronically Signed   By: Denman Fischer M.D.   On: 08/26/2023 00:03     Assessment and plan- Patient is a 78 y.o. male with history of stage I adenocarcinoma of the right upper lobe in 2019 s/p chemotherapy and  radiation currently in remission here for a routine follow-up  I have reviewed CT chest images independently and discussed findings with the patient which does not show any evidence of recurrent or progressive disease.  Stable radiation changes noted in the left upper lobe.  We are now more than 5 years out of his lung cancer diagnosis and clinically patient is doing well with no concerning signs and symptoms of recurrence based on today's exam.  I will see him back in 1 year with CT chest without contrast prior.  Patient   Visit Diagnosis 1. Encounter for follow-up surveillance of lung cancer      Dr. Seretha Dance, MD, MPH [facility] [phone] 08/26/2023 11:05 AM

## 2024-02-24 ENCOUNTER — Other Ambulatory Visit: Payer: Self-pay | Admitting: Specialist

## 2024-02-24 DIAGNOSIS — R059 Cough, unspecified: Secondary | ICD-10-CM

## 2024-02-24 DIAGNOSIS — R918 Other nonspecific abnormal finding of lung field: Secondary | ICD-10-CM

## 2024-03-19 ENCOUNTER — Ambulatory Visit
Admission: RE | Admit: 2024-03-19 | Discharge: 2024-03-19 | Disposition: A | Discharge status: Home / Self Care | Source: Ambulatory Visit | Attending: Specialist | Admitting: Specialist

## 2024-03-19 DIAGNOSIS — R918 Other nonspecific abnormal finding of lung field: Secondary | ICD-10-CM | POA: Insufficient documentation

## 2024-03-19 DIAGNOSIS — R059 Cough, unspecified: Secondary | ICD-10-CM | POA: Diagnosis present

## 2024-08-21 ENCOUNTER — Other Ambulatory Visit

## 2024-09-04 ENCOUNTER — Ambulatory Visit: Admitting: Oncology
